# Patient Record
Sex: Male | Born: 1940
Health system: Southern US, Community
[De-identification: ages and names within clinical notes are randomized; demographics above are authoritative.]

## PROBLEM LIST (undated history)

## (undated) DIAGNOSIS — K219 Gastro-esophageal reflux disease without esophagitis: Secondary | ICD-10-CM

## (undated) DIAGNOSIS — E119 Type 2 diabetes mellitus without complications: Secondary | ICD-10-CM

## (undated) DIAGNOSIS — IMO0001 Reserved for inherently not codable concepts without codable children: Secondary | ICD-10-CM

## (undated) DIAGNOSIS — Z794 Long term (current) use of insulin: Secondary | ICD-10-CM

## (undated) DIAGNOSIS — M109 Gout, unspecified: Secondary | ICD-10-CM

## (undated) DIAGNOSIS — N189 Chronic kidney disease, unspecified: Secondary | ICD-10-CM

## (undated) DIAGNOSIS — I10 Essential (primary) hypertension: Secondary | ICD-10-CM

## (undated) DIAGNOSIS — N400 Enlarged prostate without lower urinary tract symptoms: Secondary | ICD-10-CM

## (undated) DIAGNOSIS — E785 Hyperlipidemia, unspecified: Secondary | ICD-10-CM

## (undated) HISTORY — DX: Chronic kidney disease, unspecified: N18.9

## (undated) HISTORY — DX: Benign prostatic hyperplasia without lower urinary tract symptoms: N40.0

## (undated) HISTORY — PX: BACK SURGERY: SHX140

## (undated) HISTORY — PX: KNEE ARTHROSCOPY: SUR90

## (undated) HISTORY — PX: BUNIONECTOMY: SHX129

## (undated) HISTORY — PX: NEPHRECTOMY: SHX65

---

## 2014-07-04 DIAGNOSIS — N183 Chronic kidney disease, stage 3 (moderate): Secondary | ICD-10-CM | POA: Diagnosis not present

## 2014-07-12 DIAGNOSIS — E782 Mixed hyperlipidemia: Secondary | ICD-10-CM | POA: Diagnosis not present

## 2014-07-12 DIAGNOSIS — I10 Essential (primary) hypertension: Secondary | ICD-10-CM | POA: Diagnosis not present

## 2014-07-12 DIAGNOSIS — N183 Chronic kidney disease, stage 3 (moderate): Secondary | ICD-10-CM | POA: Diagnosis not present

## 2014-07-12 DIAGNOSIS — Z905 Acquired absence of kidney: Secondary | ICD-10-CM | POA: Diagnosis not present

## 2014-07-12 DIAGNOSIS — E1122 Type 2 diabetes mellitus with diabetic chronic kidney disease: Secondary | ICD-10-CM | POA: Diagnosis not present

## 2014-10-04 DIAGNOSIS — E119 Type 2 diabetes mellitus without complications: Secondary | ICD-10-CM | POA: Diagnosis not present

## 2014-10-04 DIAGNOSIS — Z0101 Encounter for examination of eyes and vision with abnormal findings: Secondary | ICD-10-CM | POA: Diagnosis not present

## 2014-10-07 DIAGNOSIS — E559 Vitamin D deficiency, unspecified: Secondary | ICD-10-CM | POA: Diagnosis not present

## 2014-10-07 DIAGNOSIS — N183 Chronic kidney disease, stage 3 (moderate): Secondary | ICD-10-CM | POA: Diagnosis not present

## 2014-10-07 DIAGNOSIS — E1122 Type 2 diabetes mellitus with diabetic chronic kidney disease: Secondary | ICD-10-CM | POA: Diagnosis not present

## 2014-10-07 DIAGNOSIS — I10 Essential (primary) hypertension: Secondary | ICD-10-CM | POA: Diagnosis not present

## 2014-10-07 DIAGNOSIS — E782 Mixed hyperlipidemia: Secondary | ICD-10-CM | POA: Diagnosis not present

## 2014-10-11 DIAGNOSIS — E782 Mixed hyperlipidemia: Secondary | ICD-10-CM | POA: Diagnosis not present

## 2014-10-11 DIAGNOSIS — I10 Essential (primary) hypertension: Secondary | ICD-10-CM | POA: Diagnosis not present

## 2014-10-11 DIAGNOSIS — E1122 Type 2 diabetes mellitus with diabetic chronic kidney disease: Secondary | ICD-10-CM | POA: Diagnosis not present

## 2014-10-11 DIAGNOSIS — N183 Chronic kidney disease, stage 3 (moderate): Secondary | ICD-10-CM | POA: Diagnosis not present

## 2014-11-20 DIAGNOSIS — N183 Chronic kidney disease, stage 3 (moderate): Secondary | ICD-10-CM | POA: Diagnosis not present

## 2014-11-20 DIAGNOSIS — E1142 Type 2 diabetes mellitus with diabetic polyneuropathy: Secondary | ICD-10-CM | POA: Diagnosis not present

## 2014-11-20 DIAGNOSIS — E780 Pure hypercholesterolemia: Secondary | ICD-10-CM | POA: Diagnosis not present

## 2014-11-20 DIAGNOSIS — I10 Essential (primary) hypertension: Secondary | ICD-10-CM | POA: Diagnosis not present

## 2014-12-19 DIAGNOSIS — N183 Chronic kidney disease, stage 3 (moderate): Secondary | ICD-10-CM | POA: Diagnosis not present

## 2014-12-19 DIAGNOSIS — E1142 Type 2 diabetes mellitus with diabetic polyneuropathy: Secondary | ICD-10-CM | POA: Diagnosis not present

## 2014-12-19 DIAGNOSIS — J309 Allergic rhinitis, unspecified: Secondary | ICD-10-CM | POA: Diagnosis not present

## 2014-12-19 DIAGNOSIS — E780 Pure hypercholesterolemia: Secondary | ICD-10-CM | POA: Diagnosis not present

## 2014-12-19 DIAGNOSIS — H6120 Impacted cerumen, unspecified ear: Secondary | ICD-10-CM | POA: Diagnosis not present

## 2014-12-19 DIAGNOSIS — I10 Essential (primary) hypertension: Secondary | ICD-10-CM | POA: Diagnosis not present

## 2015-01-17 DIAGNOSIS — I10 Essential (primary) hypertension: Secondary | ICD-10-CM | POA: Diagnosis not present

## 2015-01-17 DIAGNOSIS — E559 Vitamin D deficiency, unspecified: Secondary | ICD-10-CM | POA: Diagnosis not present

## 2015-01-17 DIAGNOSIS — E1122 Type 2 diabetes mellitus with diabetic chronic kidney disease: Secondary | ICD-10-CM | POA: Diagnosis not present

## 2015-01-17 DIAGNOSIS — N183 Chronic kidney disease, stage 3 (moderate): Secondary | ICD-10-CM | POA: Diagnosis not present

## 2015-01-23 DIAGNOSIS — E1122 Type 2 diabetes mellitus with diabetic chronic kidney disease: Secondary | ICD-10-CM | POA: Diagnosis not present

## 2015-01-23 DIAGNOSIS — Z905 Acquired absence of kidney: Secondary | ICD-10-CM | POA: Diagnosis not present

## 2015-01-23 DIAGNOSIS — I10 Essential (primary) hypertension: Secondary | ICD-10-CM | POA: Diagnosis not present

## 2015-01-23 DIAGNOSIS — N183 Chronic kidney disease, stage 3 (moderate): Secondary | ICD-10-CM | POA: Diagnosis not present

## 2015-02-20 DIAGNOSIS — E780 Pure hypercholesterolemia: Secondary | ICD-10-CM | POA: Diagnosis not present

## 2015-02-20 DIAGNOSIS — N183 Chronic kidney disease, stage 3 (moderate): Secondary | ICD-10-CM | POA: Diagnosis not present

## 2015-02-20 DIAGNOSIS — I10 Essential (primary) hypertension: Secondary | ICD-10-CM | POA: Diagnosis not present

## 2015-02-20 DIAGNOSIS — J309 Allergic rhinitis, unspecified: Secondary | ICD-10-CM | POA: Diagnosis not present

## 2015-02-20 DIAGNOSIS — E119 Type 2 diabetes mellitus without complications: Secondary | ICD-10-CM | POA: Diagnosis not present

## 2015-02-20 DIAGNOSIS — E1142 Type 2 diabetes mellitus with diabetic polyneuropathy: Secondary | ICD-10-CM | POA: Diagnosis not present

## 2015-04-10 DIAGNOSIS — N521 Erectile dysfunction due to diseases classified elsewhere: Secondary | ICD-10-CM | POA: Diagnosis not present

## 2015-04-10 DIAGNOSIS — R972 Elevated prostate specific antigen [PSA]: Secondary | ICD-10-CM | POA: Diagnosis not present

## 2015-04-10 DIAGNOSIS — N401 Enlarged prostate with lower urinary tract symptoms: Secondary | ICD-10-CM | POA: Diagnosis not present

## 2015-04-10 DIAGNOSIS — E291 Testicular hypofunction: Secondary | ICD-10-CM | POA: Diagnosis not present

## 2015-04-18 DIAGNOSIS — N183 Chronic kidney disease, stage 3 (moderate): Secondary | ICD-10-CM | POA: Diagnosis not present

## 2015-04-24 DIAGNOSIS — R972 Elevated prostate specific antigen [PSA]: Secondary | ICD-10-CM | POA: Diagnosis not present

## 2015-04-24 DIAGNOSIS — N521 Erectile dysfunction due to diseases classified elsewhere: Secondary | ICD-10-CM | POA: Diagnosis not present

## 2015-04-24 DIAGNOSIS — N401 Enlarged prostate with lower urinary tract symptoms: Secondary | ICD-10-CM | POA: Diagnosis not present

## 2015-04-24 DIAGNOSIS — Z905 Acquired absence of kidney: Secondary | ICD-10-CM | POA: Diagnosis not present

## 2015-04-27 DIAGNOSIS — E782 Mixed hyperlipidemia: Secondary | ICD-10-CM | POA: Diagnosis not present

## 2015-04-27 DIAGNOSIS — I10 Essential (primary) hypertension: Secondary | ICD-10-CM | POA: Diagnosis not present

## 2015-04-27 DIAGNOSIS — N183 Chronic kidney disease, stage 3 (moderate): Secondary | ICD-10-CM | POA: Diagnosis not present

## 2015-04-27 DIAGNOSIS — E1122 Type 2 diabetes mellitus with diabetic chronic kidney disease: Secondary | ICD-10-CM | POA: Diagnosis not present

## 2015-05-23 DIAGNOSIS — E1142 Type 2 diabetes mellitus with diabetic polyneuropathy: Secondary | ICD-10-CM | POA: Diagnosis not present

## 2015-05-23 DIAGNOSIS — E1122 Type 2 diabetes mellitus with diabetic chronic kidney disease: Secondary | ICD-10-CM | POA: Diagnosis not present

## 2015-05-23 DIAGNOSIS — I10 Essential (primary) hypertension: Secondary | ICD-10-CM | POA: Diagnosis not present

## 2015-05-23 DIAGNOSIS — N400 Enlarged prostate without lower urinary tract symptoms: Secondary | ICD-10-CM | POA: Diagnosis not present

## 2015-05-23 DIAGNOSIS — E782 Mixed hyperlipidemia: Secondary | ICD-10-CM | POA: Diagnosis not present

## 2015-05-23 DIAGNOSIS — N183 Chronic kidney disease, stage 3 (moderate): Secondary | ICD-10-CM | POA: Diagnosis not present

## 2015-05-23 DIAGNOSIS — L84 Corns and callosities: Secondary | ICD-10-CM | POA: Diagnosis not present

## 2015-05-23 DIAGNOSIS — E1165 Type 2 diabetes mellitus with hyperglycemia: Secondary | ICD-10-CM | POA: Diagnosis not present

## 2015-07-23 DIAGNOSIS — E559 Vitamin D deficiency, unspecified: Secondary | ICD-10-CM | POA: Diagnosis not present

## 2015-07-25 DIAGNOSIS — E782 Mixed hyperlipidemia: Secondary | ICD-10-CM | POA: Diagnosis not present

## 2015-07-25 DIAGNOSIS — I10 Essential (primary) hypertension: Secondary | ICD-10-CM | POA: Diagnosis not present

## 2015-07-25 DIAGNOSIS — E1122 Type 2 diabetes mellitus with diabetic chronic kidney disease: Secondary | ICD-10-CM | POA: Diagnosis not present

## 2015-07-25 DIAGNOSIS — N183 Chronic kidney disease, stage 3 (moderate): Secondary | ICD-10-CM | POA: Diagnosis not present

## 2015-08-13 DIAGNOSIS — J208 Acute bronchitis due to other specified organisms: Secondary | ICD-10-CM | POA: Diagnosis not present

## 2015-08-13 DIAGNOSIS — I10 Essential (primary) hypertension: Secondary | ICD-10-CM | POA: Diagnosis not present

## 2015-08-13 DIAGNOSIS — L84 Corns and callosities: Secondary | ICD-10-CM | POA: Diagnosis not present

## 2015-08-13 DIAGNOSIS — N183 Chronic kidney disease, stage 3 (moderate): Secondary | ICD-10-CM | POA: Diagnosis not present

## 2015-08-13 DIAGNOSIS — R05 Cough: Secondary | ICD-10-CM | POA: Diagnosis not present

## 2015-08-13 DIAGNOSIS — E1165 Type 2 diabetes mellitus with hyperglycemia: Secondary | ICD-10-CM | POA: Diagnosis not present

## 2015-08-13 DIAGNOSIS — E782 Mixed hyperlipidemia: Secondary | ICD-10-CM | POA: Diagnosis not present

## 2015-08-13 DIAGNOSIS — E1122 Type 2 diabetes mellitus with diabetic chronic kidney disease: Secondary | ICD-10-CM | POA: Diagnosis not present

## 2015-08-13 DIAGNOSIS — N400 Enlarged prostate without lower urinary tract symptoms: Secondary | ICD-10-CM | POA: Diagnosis not present

## 2015-08-13 DIAGNOSIS — J060 Acute laryngopharyngitis: Secondary | ICD-10-CM | POA: Diagnosis not present

## 2015-08-13 DIAGNOSIS — E1142 Type 2 diabetes mellitus with diabetic polyneuropathy: Secondary | ICD-10-CM | POA: Diagnosis not present

## 2015-08-27 DIAGNOSIS — E1142 Type 2 diabetes mellitus with diabetic polyneuropathy: Secondary | ICD-10-CM | POA: Diagnosis not present

## 2015-08-27 DIAGNOSIS — E1165 Type 2 diabetes mellitus with hyperglycemia: Secondary | ICD-10-CM | POA: Diagnosis not present

## 2015-08-27 DIAGNOSIS — E782 Mixed hyperlipidemia: Secondary | ICD-10-CM | POA: Diagnosis not present

## 2015-08-27 DIAGNOSIS — N183 Chronic kidney disease, stage 3 (moderate): Secondary | ICD-10-CM | POA: Diagnosis not present

## 2015-08-27 DIAGNOSIS — E1122 Type 2 diabetes mellitus with diabetic chronic kidney disease: Secondary | ICD-10-CM | POA: Diagnosis not present

## 2015-08-27 DIAGNOSIS — Z905 Acquired absence of kidney: Secondary | ICD-10-CM | POA: Diagnosis not present

## 2015-08-27 DIAGNOSIS — R05 Cough: Secondary | ICD-10-CM | POA: Diagnosis not present

## 2015-08-27 DIAGNOSIS — N400 Enlarged prostate without lower urinary tract symptoms: Secondary | ICD-10-CM | POA: Diagnosis not present

## 2015-08-27 DIAGNOSIS — J208 Acute bronchitis due to other specified organisms: Secondary | ICD-10-CM | POA: Diagnosis not present

## 2015-08-27 DIAGNOSIS — J309 Allergic rhinitis, unspecified: Secondary | ICD-10-CM | POA: Diagnosis not present

## 2015-08-27 DIAGNOSIS — J060 Acute laryngopharyngitis: Secondary | ICD-10-CM | POA: Diagnosis not present

## 2015-08-27 DIAGNOSIS — I10 Essential (primary) hypertension: Secondary | ICD-10-CM | POA: Diagnosis not present

## 2015-08-27 DIAGNOSIS — L84 Corns and callosities: Secondary | ICD-10-CM | POA: Diagnosis not present

## 2015-10-05 DIAGNOSIS — E119 Type 2 diabetes mellitus without complications: Secondary | ICD-10-CM | POA: Diagnosis not present

## 2015-10-05 DIAGNOSIS — Z0101 Encounter for examination of eyes and vision with abnormal findings: Secondary | ICD-10-CM | POA: Diagnosis not present

## 2015-10-16 DIAGNOSIS — N183 Chronic kidney disease, stage 3 (moderate): Secondary | ICD-10-CM | POA: Diagnosis not present

## 2015-10-24 DIAGNOSIS — E1122 Type 2 diabetes mellitus with diabetic chronic kidney disease: Secondary | ICD-10-CM | POA: Diagnosis not present

## 2015-10-24 DIAGNOSIS — N183 Chronic kidney disease, stage 3 (moderate): Secondary | ICD-10-CM | POA: Diagnosis not present

## 2015-10-24 DIAGNOSIS — E782 Mixed hyperlipidemia: Secondary | ICD-10-CM | POA: Diagnosis not present

## 2015-10-24 DIAGNOSIS — I10 Essential (primary) hypertension: Secondary | ICD-10-CM | POA: Diagnosis not present

## 2016-01-21 DIAGNOSIS — E559 Vitamin D deficiency, unspecified: Secondary | ICD-10-CM | POA: Diagnosis not present

## 2016-01-21 DIAGNOSIS — E1122 Type 2 diabetes mellitus with diabetic chronic kidney disease: Secondary | ICD-10-CM | POA: Diagnosis not present

## 2016-01-21 DIAGNOSIS — N183 Chronic kidney disease, stage 3 (moderate): Secondary | ICD-10-CM | POA: Diagnosis not present

## 2016-01-21 DIAGNOSIS — I10 Essential (primary) hypertension: Secondary | ICD-10-CM | POA: Diagnosis not present

## 2016-01-30 DIAGNOSIS — Z905 Acquired absence of kidney: Secondary | ICD-10-CM | POA: Diagnosis not present

## 2016-01-30 DIAGNOSIS — E1122 Type 2 diabetes mellitus with diabetic chronic kidney disease: Secondary | ICD-10-CM | POA: Diagnosis not present

## 2016-01-30 DIAGNOSIS — I129 Hypertensive chronic kidney disease with stage 1 through stage 4 chronic kidney disease, or unspecified chronic kidney disease: Secondary | ICD-10-CM | POA: Diagnosis not present

## 2016-01-30 DIAGNOSIS — N183 Chronic kidney disease, stage 3 (moderate): Secondary | ICD-10-CM | POA: Diagnosis not present

## 2016-03-19 DIAGNOSIS — N183 Chronic kidney disease, stage 3 (moderate): Secondary | ICD-10-CM | POA: Diagnosis not present

## 2016-03-19 DIAGNOSIS — L84 Corns and callosities: Secondary | ICD-10-CM | POA: Diagnosis not present

## 2016-03-19 DIAGNOSIS — I10 Essential (primary) hypertension: Secondary | ICD-10-CM | POA: Diagnosis not present

## 2016-03-19 DIAGNOSIS — E1165 Type 2 diabetes mellitus with hyperglycemia: Secondary | ICD-10-CM | POA: Diagnosis not present

## 2016-03-19 DIAGNOSIS — J060 Acute laryngopharyngitis: Secondary | ICD-10-CM | POA: Diagnosis not present

## 2016-03-19 DIAGNOSIS — Z0000 Encounter for general adult medical examination without abnormal findings: Secondary | ICD-10-CM | POA: Diagnosis not present

## 2016-03-19 DIAGNOSIS — J208 Acute bronchitis due to other specified organisms: Secondary | ICD-10-CM | POA: Diagnosis not present

## 2016-03-19 DIAGNOSIS — N400 Enlarged prostate without lower urinary tract symptoms: Secondary | ICD-10-CM | POA: Diagnosis not present

## 2016-03-19 DIAGNOSIS — E1142 Type 2 diabetes mellitus with diabetic polyneuropathy: Secondary | ICD-10-CM | POA: Diagnosis not present

## 2016-03-19 DIAGNOSIS — R05 Cough: Secondary | ICD-10-CM | POA: Diagnosis not present

## 2016-03-19 DIAGNOSIS — E782 Mixed hyperlipidemia: Secondary | ICD-10-CM | POA: Diagnosis not present

## 2016-03-19 DIAGNOSIS — E1122 Type 2 diabetes mellitus with diabetic chronic kidney disease: Secondary | ICD-10-CM | POA: Diagnosis not present

## 2016-04-28 DIAGNOSIS — Z905 Acquired absence of kidney: Secondary | ICD-10-CM | POA: Diagnosis not present

## 2016-04-28 DIAGNOSIS — E559 Vitamin D deficiency, unspecified: Secondary | ICD-10-CM | POA: Diagnosis not present

## 2016-04-28 DIAGNOSIS — N183 Chronic kidney disease, stage 3 (moderate): Secondary | ICD-10-CM | POA: Diagnosis not present

## 2016-04-28 DIAGNOSIS — E1122 Type 2 diabetes mellitus with diabetic chronic kidney disease: Secondary | ICD-10-CM | POA: Diagnosis not present

## 2016-04-28 DIAGNOSIS — M1039 Gout due to renal impairment, multiple sites: Secondary | ICD-10-CM | POA: Diagnosis not present

## 2016-05-06 DIAGNOSIS — N183 Chronic kidney disease, stage 3 (moderate): Secondary | ICD-10-CM | POA: Diagnosis not present

## 2016-05-06 DIAGNOSIS — I129 Hypertensive chronic kidney disease with stage 1 through stage 4 chronic kidney disease, or unspecified chronic kidney disease: Secondary | ICD-10-CM | POA: Diagnosis not present

## 2016-05-06 DIAGNOSIS — Z905 Acquired absence of kidney: Secondary | ICD-10-CM | POA: Diagnosis not present

## 2016-05-06 DIAGNOSIS — E1122 Type 2 diabetes mellitus with diabetic chronic kidney disease: Secondary | ICD-10-CM | POA: Diagnosis not present

## 2016-05-08 DIAGNOSIS — N521 Erectile dysfunction due to diseases classified elsewhere: Secondary | ICD-10-CM | POA: Diagnosis not present

## 2016-05-08 DIAGNOSIS — N401 Enlarged prostate with lower urinary tract symptoms: Secondary | ICD-10-CM | POA: Diagnosis not present

## 2016-05-08 DIAGNOSIS — Z905 Acquired absence of kidney: Secondary | ICD-10-CM | POA: Diagnosis not present

## 2016-05-08 DIAGNOSIS — R972 Elevated prostate specific antigen [PSA]: Secondary | ICD-10-CM | POA: Diagnosis not present

## 2016-05-26 ENCOUNTER — Emergency Department (HOSPITAL_COMMUNITY): Payer: Medicare Other

## 2016-05-26 ENCOUNTER — Inpatient Hospital Stay (HOSPITAL_COMMUNITY)
Admission: EM | Admit: 2016-05-26 | Discharge: 2016-06-06 | DRG: 871 | Disposition: A | Discharge status: Home Health | Payer: Medicare Other | Attending: Internal Medicine | Admitting: Internal Medicine

## 2016-05-26 ENCOUNTER — Encounter (HOSPITAL_COMMUNITY): Payer: Self-pay

## 2016-05-26 DIAGNOSIS — R410 Disorientation, unspecified: Secondary | ICD-10-CM | POA: Diagnosis present

## 2016-05-26 DIAGNOSIS — E669 Obesity, unspecified: Secondary | ICD-10-CM | POA: Diagnosis present

## 2016-05-26 DIAGNOSIS — D649 Anemia, unspecified: Secondary | ICD-10-CM | POA: Diagnosis present

## 2016-05-26 DIAGNOSIS — J3489 Other specified disorders of nose and nasal sinuses: Secondary | ICD-10-CM | POA: Diagnosis not present

## 2016-05-26 DIAGNOSIS — K830 Cholangitis: Secondary | ICD-10-CM | POA: Diagnosis present

## 2016-05-26 DIAGNOSIS — R7401 Elevation of levels of liver transaminase levels: Secondary | ICD-10-CM | POA: Diagnosis present

## 2016-05-26 DIAGNOSIS — M6282 Rhabdomyolysis: Secondary | ICD-10-CM | POA: Diagnosis present

## 2016-05-26 DIAGNOSIS — A419 Sepsis, unspecified organism: Secondary | ICD-10-CM | POA: Diagnosis not present

## 2016-05-26 DIAGNOSIS — R197 Diarrhea, unspecified: Secondary | ICD-10-CM | POA: Diagnosis present

## 2016-05-26 DIAGNOSIS — R0600 Dyspnea, unspecified: Secondary | ICD-10-CM | POA: Diagnosis not present

## 2016-05-26 DIAGNOSIS — N179 Acute kidney failure, unspecified: Secondary | ICD-10-CM | POA: Diagnosis present

## 2016-05-26 DIAGNOSIS — J9601 Acute respiratory failure with hypoxia: Secondary | ICD-10-CM | POA: Diagnosis not present

## 2016-05-26 DIAGNOSIS — E785 Hyperlipidemia, unspecified: Secondary | ICD-10-CM | POA: Diagnosis present

## 2016-05-26 DIAGNOSIS — K529 Noninfective gastroenteritis and colitis, unspecified: Secondary | ICD-10-CM | POA: Diagnosis not present

## 2016-05-26 DIAGNOSIS — G9341 Metabolic encephalopathy: Secondary | ICD-10-CM | POA: Diagnosis not present

## 2016-05-26 DIAGNOSIS — N390 Urinary tract infection, site not specified: Secondary | ICD-10-CM

## 2016-05-26 DIAGNOSIS — A4151 Sepsis due to Escherichia coli [E. coli]: Principal | ICD-10-CM | POA: Diagnosis present

## 2016-05-26 DIAGNOSIS — D696 Thrombocytopenia, unspecified: Secondary | ICD-10-CM | POA: Diagnosis present

## 2016-05-26 DIAGNOSIS — I5021 Acute systolic (congestive) heart failure: Secondary | ICD-10-CM | POA: Diagnosis present

## 2016-05-26 DIAGNOSIS — K219 Gastro-esophageal reflux disease without esophagitis: Secondary | ICD-10-CM | POA: Diagnosis present

## 2016-05-26 DIAGNOSIS — E872 Acidosis: Secondary | ICD-10-CM | POA: Diagnosis present

## 2016-05-26 DIAGNOSIS — I272 Pulmonary hypertension, unspecified: Secondary | ICD-10-CM | POA: Diagnosis present

## 2016-05-26 DIAGNOSIS — I10 Essential (primary) hypertension: Secondary | ICD-10-CM | POA: Diagnosis present

## 2016-05-26 DIAGNOSIS — R531 Weakness: Secondary | ICD-10-CM | POA: Diagnosis not present

## 2016-05-26 DIAGNOSIS — R404 Transient alteration of awareness: Secondary | ICD-10-CM | POA: Diagnosis not present

## 2016-05-26 DIAGNOSIS — K810 Acute cholecystitis: Secondary | ICD-10-CM | POA: Diagnosis present

## 2016-05-26 DIAGNOSIS — E875 Hyperkalemia: Secondary | ICD-10-CM | POA: Diagnosis not present

## 2016-05-26 DIAGNOSIS — I248 Other forms of acute ischemic heart disease: Secondary | ICD-10-CM | POA: Diagnosis present

## 2016-05-26 DIAGNOSIS — R6520 Severe sepsis without septic shock: Secondary | ICD-10-CM | POA: Diagnosis present

## 2016-05-26 DIAGNOSIS — E1165 Type 2 diabetes mellitus with hyperglycemia: Secondary | ICD-10-CM | POA: Diagnosis present

## 2016-05-26 DIAGNOSIS — K7200 Acute and subacute hepatic failure without coma: Secondary | ICD-10-CM | POA: Diagnosis not present

## 2016-05-26 DIAGNOSIS — Z6834 Body mass index (BMI) 34.0-34.9, adult: Secondary | ICD-10-CM

## 2016-05-26 DIAGNOSIS — N281 Cyst of kidney, acquired: Secondary | ICD-10-CM | POA: Diagnosis present

## 2016-05-26 DIAGNOSIS — Z452 Encounter for adjustment and management of vascular access device: Secondary | ICD-10-CM

## 2016-05-26 DIAGNOSIS — IMO0001 Reserved for inherently not codable concepts without codable children: Secondary | ICD-10-CM

## 2016-05-26 DIAGNOSIS — W182XXA Fall in (into) shower or empty bathtub, initial encounter: Secondary | ICD-10-CM | POA: Diagnosis present

## 2016-05-26 DIAGNOSIS — B9620 Unspecified Escherichia coli [E. coli] as the cause of diseases classified elsewhere: Secondary | ICD-10-CM

## 2016-05-26 DIAGNOSIS — I429 Cardiomyopathy, unspecified: Secondary | ICD-10-CM

## 2016-05-26 DIAGNOSIS — Z794 Long term (current) use of insulin: Secondary | ICD-10-CM

## 2016-05-26 DIAGNOSIS — R7881 Bacteremia: Secondary | ICD-10-CM

## 2016-05-26 DIAGNOSIS — Z905 Acquired absence of kidney: Secondary | ICD-10-CM

## 2016-05-26 DIAGNOSIS — Z8249 Family history of ischemic heart disease and other diseases of the circulatory system: Secondary | ICD-10-CM

## 2016-05-26 DIAGNOSIS — R32 Unspecified urinary incontinence: Secondary | ICD-10-CM | POA: Diagnosis not present

## 2016-05-26 DIAGNOSIS — N170 Acute kidney failure with tubular necrosis: Secondary | ICD-10-CM | POA: Diagnosis not present

## 2016-05-26 DIAGNOSIS — K8309 Other cholangitis: Secondary | ICD-10-CM

## 2016-05-26 DIAGNOSIS — M109 Gout, unspecified: Secondary | ICD-10-CM | POA: Diagnosis present

## 2016-05-26 DIAGNOSIS — R740 Nonspecific elevation of levels of transaminase and lactic acid dehydrogenase [LDH]: Secondary | ICD-10-CM

## 2016-05-26 DIAGNOSIS — Y93E1 Activity, personal bathing and showering: Secondary | ICD-10-CM

## 2016-05-26 DIAGNOSIS — R7989 Other specified abnormal findings of blood chemistry: Secondary | ICD-10-CM | POA: Diagnosis not present

## 2016-05-26 DIAGNOSIS — E119 Type 2 diabetes mellitus without complications: Secondary | ICD-10-CM

## 2016-05-26 DIAGNOSIS — E1122 Type 2 diabetes mellitus with diabetic chronic kidney disease: Secondary | ICD-10-CM | POA: Diagnosis present

## 2016-05-26 HISTORY — DX: Gastro-esophageal reflux disease without esophagitis: K21.9

## 2016-05-26 HISTORY — DX: Reserved for inherently not codable concepts without codable children: IMO0001

## 2016-05-26 HISTORY — DX: Essential (primary) hypertension: I10

## 2016-05-26 HISTORY — DX: Gout, unspecified: M10.9

## 2016-05-26 HISTORY — DX: Hyperlipidemia, unspecified: E78.5

## 2016-05-26 HISTORY — DX: Type 2 diabetes mellitus without complications: E11.9

## 2016-05-26 HISTORY — DX: Long term (current) use of insulin: Z79.4

## 2016-05-26 LAB — CBC WITH DIFFERENTIAL/PLATELET
Basophils Absolute: 0 10*3/uL (ref 0.0–0.1)
Basophils Relative: 0 %
EOS ABS: 0 10*3/uL (ref 0.0–0.7)
EOS PCT: 0 %
HCT: 32.6 % — ABNORMAL LOW (ref 39.0–52.0)
Hemoglobin: 11.2 g/dL — ABNORMAL LOW (ref 13.0–17.0)
LYMPHS ABS: 0.6 10*3/uL — AB (ref 0.7–4.0)
Lymphocytes Relative: 6 %
MCH: 30.6 pg (ref 26.0–34.0)
MCHC: 34.4 g/dL (ref 30.0–36.0)
MCV: 89.1 fL (ref 78.0–100.0)
Monocytes Absolute: 0.9 10*3/uL (ref 0.1–1.0)
Monocytes Relative: 9 %
Neutro Abs: 8.8 10*3/uL — ABNORMAL HIGH (ref 1.7–7.7)
Neutrophils Relative %: 85 %
PLATELETS: 95 10*3/uL — AB (ref 150–400)
RBC: 3.66 MIL/uL — AB (ref 4.22–5.81)
RDW: 14.8 % (ref 11.5–15.5)
WBC: 10.3 10*3/uL (ref 4.0–10.5)

## 2016-05-26 LAB — TROPONIN I: TROPONIN I: 0.13 ng/mL — AB (ref <0.03)

## 2016-05-26 LAB — COMPREHENSIVE METABOLIC PANEL
ALK PHOS: 110 U/L (ref 38–126)
ALT: 101 U/L — AB (ref 17–63)
AST: 310 U/L — ABNORMAL HIGH (ref 15–41)
Albumin: 2.8 g/dL — ABNORMAL LOW (ref 3.5–5.0)
Anion gap: 12 (ref 5–15)
BILIRUBIN TOTAL: 1.2 mg/dL (ref 0.3–1.2)
BUN: 50 mg/dL — ABNORMAL HIGH (ref 6–20)
CALCIUM: 8.6 mg/dL — AB (ref 8.9–10.3)
CO2: 16 mmol/L — AB (ref 22–32)
CREATININE: 4.78 mg/dL — AB (ref 0.61–1.24)
Chloride: 98 mmol/L — ABNORMAL LOW (ref 101–111)
GFR, EST AFRICAN AMERICAN: 13 mL/min — AB (ref >60)
GFR, EST NON AFRICAN AMERICAN: 11 mL/min — AB (ref >60)
Glucose, Bld: 347 mg/dL — ABNORMAL HIGH (ref 65–99)
Potassium: 4.9 mmol/L (ref 3.5–5.1)
Sodium: 126 mmol/L — ABNORMAL LOW (ref 135–145)
Total Protein: 6.1 g/dL — ABNORMAL LOW (ref 6.5–8.1)

## 2016-05-26 LAB — BRAIN NATRIURETIC PEPTIDE: B Natriuretic Peptide: 190.1 pg/mL — ABNORMAL HIGH (ref 0.0–100.0)

## 2016-05-26 LAB — I-STAT CG4 LACTIC ACID, ED: LACTIC ACID, VENOUS: 3.34 mmol/L — AB (ref 0.5–1.9)

## 2016-05-26 MED ORDER — ACETAMINOPHEN 500 MG PO TABS
1000.0000 mg | ORAL_TABLET | Freq: Once | ORAL | Status: AC
  Administered 2016-05-27: 1000 mg via ORAL
  Filled 2016-05-26: qty 2

## 2016-05-26 MED ORDER — PIPERACILLIN-TAZOBACTAM 3.375 G IVPB 30 MIN
3.3750 g | Freq: Once | INTRAVENOUS | Status: AC
  Administered 2016-05-27: 3.375 g via INTRAVENOUS

## 2016-05-26 MED ORDER — VANCOMYCIN HCL 10 G IV SOLR
1500.0000 mg | Freq: Once | INTRAVENOUS | Status: AC
  Administered 2016-05-27: 1500 mg via INTRAVENOUS
  Filled 2016-05-26: qty 1500

## 2016-05-26 MED ORDER — SODIUM CHLORIDE 0.9 % IV BOLUS (SEPSIS)
30.0000 mL/kg | Freq: Once | INTRAVENOUS | Status: AC
  Administered 2016-05-26: 2859 mL via INTRAVENOUS

## 2016-05-26 NOTE — ED Notes (Signed)
Elevated CG-4 of 3.34 reported to [facility]

## 2016-05-26 NOTE — ED Provider Notes (Signed)
[facility]-EMERGENCY DEPT Provider Note   CSN: [id number] Arrival date & time: 05/26/16  2150     History   Chief Complaint Chief Complaint  Patient presents with  . Shortness of Breath  . Weakness      HPI HPI Jonathan Waller is a 75 y.o. Male with history of IDDM, HTN, HLD, gout, GERD, single kidney who presents to the emergency room for evaluation of generalized weakness, fever, fall, and intermittent confusion. He is accompanied by his daughter who helps provide some history. Pt lives alone. He was reportedly in his usual state of health until two days ago when he started feeling generally weak like he had no strength. He states he has also felt more dyspenic on exertion. Denies chest pain. Endorses productive cough. He states today he fell twice. The first episode was this morning. He thinks he slid off the toilet. He is not sure how long he was on the floor until he was found by his family members. He states he did not lose consciousness or hit his head. The second fall was earlier this evening. His daughter found him on the bedroom floor. Pt states he tried to get out of bed but did not have the strength to get up. He thinks he was on the ground approximately one hour until his daughter got to him. When she found him pt had soild himself with diarrhea. he has had multiple episodes of loose stools today. Denies recent abx. Denies urinary symptoms. Pt's daughter states tmax at home 101F orally. He has had no medications prior to arrival. Pt's daughter states that pt has also been intermittently confused and "off" these past two days. In the ED pt is alert and oriented, complaining of low back pain. However, he does have some moments where his mentation seems less lucid.   History reviewed. No pertinent past medical history.  There are no active problems to display for this patient.   History reviewed. No pertinent surgical history.   Home Medications    Prior to Admission medications    Medication Sig Start Date End Date Taking? Authorizing Provider  allopurinol (ZYLOPRIM) 300 MG tablet Take 300 mg by mouth daily.   Yes Historical Provider, MD  amLODipine (NORVASC) 5 MG tablet Take 5 mg by mouth daily.   Yes Historical Provider, MD  calcium carbonate (TUMS - DOSED IN MG ELEMENTAL CALCIUM) 500 MG chewable tablet Chew 1-2 tablets by mouth 3 (three) times daily as needed for indigestion or heartburn.   Yes Historical Provider, MD  cetirizine (ZYRTEC) 10 MG tablet Take 10 mg by mouth daily.   Yes Historical Provider, MD  insulin aspart protamine- aspart (NOVOLOG MIX 70/30) (70-30) 100 UNIT/ML injection Inject 0-20 Units into the skin 3 (three) times daily.   Yes Historical Provider, MD  OVER THE COUNTER MEDICATION Take 2 capsules by mouth 2 (two) times daily. Ventricore   Yes Historical Provider, MD  ranitidine (ZANTAC) 150 MG tablet Take 150 mg by mouth 2 (two) times daily.   Yes Historical Provider, MD  sildenafil (REVATIO) 20 MG tablet Take 60-100 mg by mouth daily as needed (ED).   Yes Historical Provider, MD  simvastatin (ZOCOR) 20 MG tablet Take 20 mg by mouth daily.   Yes Historical Provider, MD  spironolactone (ALDACTONE) 25 MG tablet Take 12.5 mg by mouth at bedtime.   Yes Historical Provider, MD  tamsulosin (FLOMAX) 0.4 MG CAPS capsule Take 0.4 mg by mouth daily.   Yes Historical Provider, MD  valsartan (DIOVAN) 320 MG tablet Take 320 mg by mouth daily.   Yes Historical Provider, MD  vardenafil (LEVITRA) 20 MG tablet Take 20 mg by mouth daily as needed for erectile dysfunction.   Yes Historical Provider, MD    Family History History reviewed. No pertinent family history.  Social History Social History  Substance Use Topics  . Smoking status: Never Smoker  . Smokeless tobacco: Never Used  . Alcohol use No     Allergies   Aspirin   Review of Systems Review of Systems 10 Systems reviewed and are negative for acute change except as noted in the  HPI.   Physical Exam Updated Vital Signs BP 148/65   Pulse 115   Temp 99.1 F (37.3 C) (Oral)   Resp 23   SpO2 96%   Physical Exam  Constitutional: He is oriented to person, place, and time.  HENT:  Right Ear: External ear normal.  Left Ear: External ear normal.  Nose: Nose normal.  Mouth/Throat: Oropharynx is clear and moist. No oropharyngeal exudate.  Eyes: Conjunctivae are normal.  Neck: Neck supple.  Cardiovascular: Regular rhythm, normal heart sounds and intact distal pulses.   Tachycardic to 110s  Pulmonary/Chest: Effort normal. No respiratory distress. He has no wheezes.  Diminished breath sounds throughout  Abdominal: Soft. Bowel sounds are normal. He exhibits no distension. There is no tenderness. There is no rebound and no guarding.  No CVA tenderness  Musculoskeletal: He exhibits no edema.  No LE edema No midline back tenderness Moves all extremities freely  Lymphadenopathy:    He has no cervical adenopathy.  Neurological: He is alert and oriented to person, place, and time. No cranial nerve deficit.  Skin: Skin is warm and dry.  Warm to touch  Psychiatric: He has a normal mood and affect.  Nursing note and vitals reviewed.    ED Treatments / Results  Labs (all labs ordered are listed, but only abnormal results are displayed) Labs Reviewed  COMPREHENSIVE METABOLIC PANEL - Abnormal; Notable for the following:       Result Value   Sodium 126 (*)    Chloride 98 (*)    CO2 16 (*)    Glucose, Bld 347 (*)    BUN 50 (*)    Creatinine, Ser 4.78 (*)    Calcium 8.6 (*)    Total Protein 6.1 (*)    Albumin 2.8 (*)    AST 310 (*)    ALT 101 (*)    GFR calc non Af Amer 11 (*)    GFR calc Af Amer 13 (*)    All other components within normal limits  TROPONIN I - Abnormal; Notable for the following:    Troponin I 0.13 (*)    All other components within normal limits  CBC WITH DIFFERENTIAL/PLATELET - Abnormal; Notable for the following:    RBC 3.66 (*)     Hemoglobin 11.2 (*)    HCT 32.6 (*)    Platelets 95 (*)    Neutro Abs 8.8 (*)    Lymphs Abs 0.6 (*)    All other components within normal limits  CK - Abnormal; Notable for the following:    Total CK 25,775 (*)    All other components within normal limits  BRAIN NATRIURETIC PEPTIDE - Abnormal; Notable for the following:    B Natriuretic Peptide 190.1 (*)    All other components within normal limits  I-STAT CG4 LACTIC ACID, ED - Abnormal; Notable for the following:  Lactic Acid, Venous 3.34 (*)    All other components within normal limits  URINE CULTURE  CULTURE, BLOOD (ROUTINE X 2)  CULTURE, BLOOD (ROUTINE X 2)  URINALYSIS, ROUTINE W REFLEX MICROSCOPIC (NOT [facility])  INFLUENZA PANEL BY PCR (TYPE A & B, H1N1)  TROPONIN I  TROPONIN I  TROPONIN I    EKG  EKG Interpretation  Date/Time:  Monday May 26 2016 21:54:17 EST Ventricular Rate:  115 PR Interval:    QRS Duration: 149 QT Interval:  344 QTC Calculation: 476 R Axis:   -63 Text Interpretation:  Sinus tachycardia RBBB and LAFB ST-t wave abnormality Abnormal ekg Confirmed by Gerhard MunchLOCKWOOD, ROBERT  MD [phone]([id number]) on 05/26/2016 10:01:47 PM       Radiology Dg Chest 2 View  Result Date: 05/26/2016 CLINICAL DATA:  Generalized weakness for several weeks.  Dyspnea. EXAM: CHEST  2 VIEW COMPARISON:  None. FINDINGS: AP semi upright and lateral views are provided. Lung volumes are slightly low with mild interstitial edema. No pneumonic consolidation, significant pleural fluid collections or pneumothoraces. The cardiac silhouette is marginally enlarged but this could be due to the AP projection. The aorta is not aneurysmal. No acute osseous abnormality. IMPRESSION: Mild interstitial prominence which may reflect interstitial edema. Mild cardiomegaly but this could be due to the AP projection. Electronically Signed   By: Tollie Ethavid  Kwon M.D.   On: 05/26/2016 23:00    Procedures Procedures (including critical care time)  Medications Ordered  in ED Medications  sodium chloride 0.9 % bolus 30 mL/kg (not administered)  acetaminophen (TYLENOL) tablet 1,000 mg (not administered)  piperacillin-tazobactam (ZOSYN) IVPB 3.375 g (not administered)     Initial Impression / Assessment and Plan / ED Course  I have reviewed the triage vital signs and the nursing notes.  Pertinent labs & imaging results that were available during my care of the patient were reviewed by me and considered in my medical decision making (see chart for details).  Clinical Course   pt is an 75 y.o. male presenting with two days of generalized weakness, some intermittent confusion at home. Fell twice today. tmax at home 1022F, no meds given. Temp here 99.22F orally on arrival. He is tachycardic, normotensive. Exam nonfocal. Will check CBC, CMP, urinalysis, troponin, and lactic acid. Fluids ordered.  11:15 PM Rectal temp 102.7. Intermittently tachypneic. Fluids have been ordered 30 cc/kg. I will add vanc/zosyn, tylenol. Code sepsis called. Pan cultures ordered.  12:07 AM Pt with renal failure and sepsis.No leukocytosis but lactic acid 3.34, Creatinine 4.78 BUN 50. Trop 0.13 likely demand. EKG in sinus tach with right bundle. LFTs elevated. Pt seen by Dr. Jeraldine LootsLockwood as well. We will call hospitalist service for stepdown admission. qSOFA 2.   CK 25,775 pt also in rhabdo. Dr. Julian ReilGardner to see and admit.  Final Clinical Impressions(s) / ED Diagnoses   Final diagnoses:  AKI (acute kidney injury) (HCC)  Sepsis, due to unspecified organism [facility](HCC)    New Prescriptions New Prescriptions   No medications on file     Derl BarrowSerena Y Ameet Sandy, PA-C 05/27/16 0041    Gerhard Munchobert Lockwood, MD 05/28/16 0004

## 2016-05-26 NOTE — ED Triage Notes (Signed)
Patient come by EMS for weakness and associated shortness of breath.  Patient has no dyspnea at rest only with exertion.  EMS was concerned for a STEMI but EKG and transmitted EKG did not show a STEMI.  MD aware.  Patient A&Ox4

## 2016-05-27 ENCOUNTER — Inpatient Hospital Stay (HOSPITAL_COMMUNITY): Payer: Medicare Other

## 2016-05-27 ENCOUNTER — Encounter (HOSPITAL_COMMUNITY): Payer: Self-pay | Admitting: Internal Medicine

## 2016-05-27 DIAGNOSIS — N390 Urinary tract infection, site not specified: Secondary | ICD-10-CM | POA: Diagnosis not present

## 2016-05-27 DIAGNOSIS — E119 Type 2 diabetes mellitus without complications: Secondary | ICD-10-CM | POA: Diagnosis not present

## 2016-05-27 DIAGNOSIS — I272 Pulmonary hypertension, unspecified: Secondary | ICD-10-CM

## 2016-05-27 DIAGNOSIS — I5021 Acute systolic (congestive) heart failure: Secondary | ICD-10-CM | POA: Diagnosis not present

## 2016-05-27 DIAGNOSIS — R1011 Right upper quadrant pain: Secondary | ICD-10-CM | POA: Diagnosis not present

## 2016-05-27 DIAGNOSIS — E875 Hyperkalemia: Secondary | ICD-10-CM | POA: Diagnosis not present

## 2016-05-27 DIAGNOSIS — A419 Sepsis, unspecified organism: Secondary | ICD-10-CM | POA: Diagnosis not present

## 2016-05-27 DIAGNOSIS — K219 Gastro-esophageal reflux disease without esophagitis: Secondary | ICD-10-CM | POA: Diagnosis present

## 2016-05-27 DIAGNOSIS — I248 Other forms of acute ischemic heart disease: Secondary | ICD-10-CM

## 2016-05-27 DIAGNOSIS — A4151 Sepsis due to Escherichia coli [E. coli]: Principal | ICD-10-CM

## 2016-05-27 DIAGNOSIS — R9431 Abnormal electrocardiogram [ECG] [EKG]: Secondary | ICD-10-CM | POA: Diagnosis not present

## 2016-05-27 DIAGNOSIS — Z905 Acquired absence of kidney: Secondary | ICD-10-CM | POA: Diagnosis not present

## 2016-05-27 DIAGNOSIS — Z794 Long term (current) use of insulin: Secondary | ICD-10-CM

## 2016-05-27 DIAGNOSIS — K830 Cholangitis: Secondary | ICD-10-CM

## 2016-05-27 DIAGNOSIS — E1129 Type 2 diabetes mellitus with other diabetic kidney complication: Secondary | ICD-10-CM | POA: Diagnosis not present

## 2016-05-27 DIAGNOSIS — E1165 Type 2 diabetes mellitus with hyperglycemia: Secondary | ICD-10-CM | POA: Diagnosis present

## 2016-05-27 DIAGNOSIS — I10 Essential (primary) hypertension: Secondary | ICD-10-CM | POA: Diagnosis present

## 2016-05-27 DIAGNOSIS — G9341 Metabolic encephalopathy: Secondary | ICD-10-CM | POA: Diagnosis not present

## 2016-05-27 DIAGNOSIS — K819 Cholecystitis, unspecified: Secondary | ICD-10-CM

## 2016-05-27 DIAGNOSIS — R6520 Severe sepsis without septic shock: Secondary | ICD-10-CM

## 2016-05-27 DIAGNOSIS — N179 Acute kidney failure, unspecified: Secondary | ICD-10-CM | POA: Insufficient documentation

## 2016-05-27 DIAGNOSIS — M6282 Rhabdomyolysis: Secondary | ICD-10-CM | POA: Diagnosis not present

## 2016-05-27 DIAGNOSIS — K810 Acute cholecystitis: Secondary | ICD-10-CM | POA: Diagnosis present

## 2016-05-27 DIAGNOSIS — Z8249 Family history of ischemic heart disease and other diseases of the circulatory system: Secondary | ICD-10-CM | POA: Diagnosis not present

## 2016-05-27 DIAGNOSIS — Y93E1 Activity, personal bathing and showering: Secondary | ICD-10-CM | POA: Diagnosis not present

## 2016-05-27 DIAGNOSIS — M109 Gout, unspecified: Secondary | ICD-10-CM | POA: Diagnosis present

## 2016-05-27 DIAGNOSIS — E872 Acidosis: Secondary | ICD-10-CM | POA: Diagnosis present

## 2016-05-27 DIAGNOSIS — D696 Thrombocytopenia, unspecified: Secondary | ICD-10-CM | POA: Diagnosis present

## 2016-05-27 DIAGNOSIS — W182XXA Fall in (into) shower or empty bathtub, initial encounter: Secondary | ICD-10-CM | POA: Diagnosis present

## 2016-05-27 DIAGNOSIS — E118 Type 2 diabetes mellitus with unspecified complications: Secondary | ICD-10-CM

## 2016-05-27 DIAGNOSIS — R740 Nonspecific elevation of levels of transaminase and lactic acid dehydrogenase [LDH]: Secondary | ICD-10-CM

## 2016-05-27 DIAGNOSIS — R410 Disorientation, unspecified: Secondary | ICD-10-CM | POA: Diagnosis not present

## 2016-05-27 DIAGNOSIS — R7881 Bacteremia: Secondary | ICD-10-CM | POA: Diagnosis not present

## 2016-05-27 DIAGNOSIS — A09 Infectious gastroenteritis and colitis, unspecified: Secondary | ICD-10-CM | POA: Diagnosis not present

## 2016-05-27 DIAGNOSIS — K7200 Acute and subacute hepatic failure without coma: Secondary | ICD-10-CM | POA: Diagnosis not present

## 2016-05-27 DIAGNOSIS — E785 Hyperlipidemia, unspecified: Secondary | ICD-10-CM | POA: Diagnosis present

## 2016-05-27 DIAGNOSIS — Z452 Encounter for adjustment and management of vascular access device: Secondary | ICD-10-CM | POA: Diagnosis not present

## 2016-05-27 DIAGNOSIS — I369 Nonrheumatic tricuspid valve disorder, unspecified: Secondary | ICD-10-CM | POA: Diagnosis not present

## 2016-05-27 DIAGNOSIS — E669 Obesity, unspecified: Secondary | ICD-10-CM | POA: Diagnosis present

## 2016-05-27 DIAGNOSIS — N170 Acute kidney failure with tubular necrosis: Secondary | ICD-10-CM | POA: Diagnosis present

## 2016-05-27 DIAGNOSIS — R7401 Elevation of levels of liver transaminase levels: Secondary | ICD-10-CM | POA: Diagnosis present

## 2016-05-27 DIAGNOSIS — IMO0001 Reserved for inherently not codable concepts without codable children: Secondary | ICD-10-CM

## 2016-05-27 DIAGNOSIS — E1122 Type 2 diabetes mellitus with diabetic chronic kidney disease: Secondary | ICD-10-CM | POA: Diagnosis present

## 2016-05-27 DIAGNOSIS — I429 Cardiomyopathy, unspecified: Secondary | ICD-10-CM | POA: Diagnosis not present

## 2016-05-27 DIAGNOSIS — R0602 Shortness of breath: Secondary | ICD-10-CM | POA: Diagnosis not present

## 2016-05-27 DIAGNOSIS — J9601 Acute respiratory failure with hypoxia: Secondary | ICD-10-CM | POA: Diagnosis present

## 2016-05-27 DIAGNOSIS — K8309 Other cholangitis: Secondary | ICD-10-CM

## 2016-05-27 LAB — BASIC METABOLIC PANEL
ANION GAP: 14 (ref 5–15)
ANION GAP: 14 (ref 5–15)
ANION GAP: 13 (ref 5–15)
BUN: 58 mg/dL — ABNORMAL HIGH (ref 6–20)
BUN: 63 mg/dL — ABNORMAL HIGH (ref 6–20)
BUN: 63 mg/dL — ABNORMAL HIGH (ref 6–20)
CALCIUM: 8.2 mg/dL — AB (ref 8.9–10.3)
CALCIUM: 8.4 mg/dL — AB (ref 8.9–10.3)
CHLORIDE: 101 mmol/L (ref 101–111)
CHLORIDE: 100 mmol/L — AB (ref 101–111)
CHLORIDE: 101 mmol/L (ref 101–111)
CO2: 16 mmol/L — AB (ref 22–32)
CO2: 15 mmol/L — AB (ref 22–32)
CO2: 17 mmol/L — AB (ref 22–32)
CREATININE: 6.97 mg/dL — AB (ref 0.61–1.24)
Calcium: 8.6 mg/dL — ABNORMAL LOW (ref 8.9–10.3)
Creatinine, Ser: 6.6 mg/dL — ABNORMAL HIGH (ref 0.61–1.24)
Creatinine, Ser: 6.84 mg/dL — ABNORMAL HIGH (ref 0.61–1.24)
GFR calc non Af Amer: 7 mL/min — ABNORMAL LOW (ref >60)
GFR calc non Af Amer: 7 mL/min — ABNORMAL LOW (ref >60)
GFR calc non Af Amer: 7 mL/min — ABNORMAL LOW (ref >60)
GFR, EST AFRICAN AMERICAN: 8 mL/min — AB (ref >60)
GFR, EST AFRICAN AMERICAN: 8 mL/min — AB (ref >60)
GFR, EST AFRICAN AMERICAN: 8 mL/min — AB (ref >60)
Glucose, Bld: 324 mg/dL — ABNORMAL HIGH (ref 65–99)
Glucose, Bld: 344 mg/dL — ABNORMAL HIGH (ref 65–99)
Glucose, Bld: 341 mg/dL — ABNORMAL HIGH (ref 65–99)
Potassium: 5.3 mmol/L — ABNORMAL HIGH (ref 3.5–5.1)
Potassium: 6.2 mmol/L — ABNORMAL HIGH (ref 3.5–5.1)
Potassium: 5.5 mmol/L — ABNORMAL HIGH (ref 3.5–5.1)
SODIUM: 131 mmol/L — AB (ref 135–145)
SODIUM: 129 mmol/L — AB (ref 135–145)
SODIUM: 131 mmol/L — AB (ref 135–145)

## 2016-05-27 LAB — LIPID PANEL
Cholesterol: 82 mg/dL (ref 0–200)
HDL: 14 mg/dL — ABNORMAL LOW (ref >40)
LDL CALC: 7 mg/dL (ref 0–99)
TRIGLYCERIDES: 306 mg/dL — AB (ref <150)
Total CHOL/HDL Ratio: 5.9 RATIO
VLDL: 61 mg/dL — AB (ref 0–40)

## 2016-05-27 LAB — COMPREHENSIVE METABOLIC PANEL
ALT: 113 U/L — AB (ref 17–63)
ANION GAP: 13 (ref 5–15)
AST: 355 U/L — ABNORMAL HIGH (ref 15–41)
Albumin: 2.8 g/dL — ABNORMAL LOW (ref 3.5–5.0)
Alkaline Phosphatase: 118 U/L (ref 38–126)
BUN: 55 mg/dL — ABNORMAL HIGH (ref 6–20)
CHLORIDE: 97 mmol/L — AB (ref 101–111)
CO2: 18 mmol/L — AB (ref 22–32)
CREATININE: 5.96 mg/dL — AB (ref 0.61–1.24)
Calcium: 8.8 mg/dL — ABNORMAL LOW (ref 8.9–10.3)
GFR calc non Af Amer: 8 mL/min — ABNORMAL LOW (ref >60)
GFR, EST AFRICAN AMERICAN: 10 mL/min — AB (ref >60)
Glucose, Bld: 348 mg/dL — ABNORMAL HIGH (ref 65–99)
POTASSIUM: 5.2 mmol/L — AB (ref 3.5–5.1)
SODIUM: 128 mmol/L — AB (ref 135–145)
Total Bilirubin: 1.6 mg/dL — ABNORMAL HIGH (ref 0.3–1.2)
Total Protein: 6.6 g/dL (ref 6.5–8.1)

## 2016-05-27 LAB — BLOOD CULTURE ID PANEL (REFLEXED)
ACINETOBACTER BAUMANNII: NOT DETECTED
CANDIDA ALBICANS: NOT DETECTED
CANDIDA GLABRATA: NOT DETECTED
Candida krusei: NOT DETECTED
Candida parapsilosis: NOT DETECTED
Candida tropicalis: NOT DETECTED
Carbapenem resistance: NOT DETECTED
ENTEROBACTER CLOACAE COMPLEX: NOT DETECTED
ENTEROBACTERIACEAE SPECIES: DETECTED — AB
ESCHERICHIA COLI: DETECTED — AB
Enterococcus species: NOT DETECTED
HAEMOPHILUS INFLUENZAE: NOT DETECTED
Klebsiella oxytoca: NOT DETECTED
Klebsiella pneumoniae: NOT DETECTED
LISTERIA MONOCYTOGENES: NOT DETECTED
NEISSERIA MENINGITIDIS: NOT DETECTED
PSEUDOMONAS AERUGINOSA: NOT DETECTED
Proteus species: NOT DETECTED
STREPTOCOCCUS AGALACTIAE: NOT DETECTED
STREPTOCOCCUS PNEUMONIAE: NOT DETECTED
STREPTOCOCCUS PYOGENES: NOT DETECTED
STREPTOCOCCUS SPECIES: NOT DETECTED
Serratia marcescens: NOT DETECTED
Staphylococcus aureus (BCID): NOT DETECTED
Staphylococcus species: NOT DETECTED

## 2016-05-27 LAB — CK
CK TOTAL: 28316 U/L — AB (ref 49–397)
CK TOTAL: 25261 U/L — AB (ref 49–397)
Total CK: 25775 U/L — ABNORMAL HIGH (ref 49–397)
Total CK: 28687 U/L — ABNORMAL HIGH (ref 49–397)
Total CK: 22886 U/L — ABNORMAL HIGH (ref 49–397)

## 2016-05-27 LAB — RENAL FUNCTION PANEL
ANION GAP: 13 (ref 5–15)
Albumin: 2.6 g/dL — ABNORMAL LOW (ref 3.5–5.0)
Albumin: 2.4 g/dL — ABNORMAL LOW (ref 3.5–5.0)
Anion gap: 13 (ref 5–15)
BUN: 68 mg/dL — AB (ref 6–20)
BUN: 70 mg/dL — AB (ref 6–20)
CALCIUM: 8.4 mg/dL — AB (ref 8.9–10.3)
CHLORIDE: 100 mmol/L — AB (ref 101–111)
CHLORIDE: 100 mmol/L — AB (ref 101–111)
CO2: 19 mmol/L — AB (ref 22–32)
CO2: 21 mmol/L — AB (ref 22–32)
CREATININE: 7.49 mg/dL — AB (ref 0.61–1.24)
Calcium: 8.4 mg/dL — ABNORMAL LOW (ref 8.9–10.3)
Creatinine, Ser: 7.53 mg/dL — ABNORMAL HIGH (ref 0.61–1.24)
GFR calc Af Amer: 7 mL/min — ABNORMAL LOW (ref >60)
GFR calc non Af Amer: 6 mL/min — ABNORMAL LOW (ref >60)
GFR calc non Af Amer: 6 mL/min — ABNORMAL LOW (ref >60)
GFR, EST AFRICAN AMERICAN: 7 mL/min — AB (ref >60)
GLUCOSE: 249 mg/dL — AB (ref 65–99)
Glucose, Bld: 315 mg/dL — ABNORMAL HIGH (ref 65–99)
POTASSIUM: 4.9 mmol/L (ref 3.5–5.1)
Phosphorus: 3.5 mg/dL (ref 2.5–4.6)
Phosphorus: 3.1 mg/dL (ref 2.5–4.6)
Potassium: 5.2 mmol/L — ABNORMAL HIGH (ref 3.5–5.1)
SODIUM: 132 mmol/L — AB (ref 135–145)
Sodium: 134 mmol/L — ABNORMAL LOW (ref 135–145)

## 2016-05-27 LAB — I-STAT ARTERIAL BLOOD GAS, ED
ACID-BASE DEFICIT: 8 mmol/L — AB (ref 0.0–2.0)
BICARBONATE: 15.5 mmol/L — AB (ref 20.0–28.0)
O2 Saturation: 96 %
PH ART: 7.336 — AB (ref 7.350–7.450)
TCO2: 16 mmol/L (ref 0–100)
pCO2 arterial: 29.7 mmHg — ABNORMAL LOW (ref 32.0–48.0)
pO2, Arterial: 98 mmHg (ref 83.0–108.0)

## 2016-05-27 LAB — URINALYSIS, ROUTINE W REFLEX MICROSCOPIC
GLUCOSE, UA: 250 mg/dL — AB
KETONES UR: 15 mg/dL — AB
NITRITE: NEGATIVE
PH: 6.5 (ref 5.0–8.0)
Protein, ur: >300 mg/dL — AB
Specific Gravity, Urine: 1.021 (ref 1.005–1.030)

## 2016-05-27 LAB — CBC
HCT: 33.7 % — ABNORMAL LOW (ref 39.0–52.0)
Hemoglobin: 11.7 g/dL — ABNORMAL LOW (ref 13.0–17.0)
MCH: 30.7 pg (ref 26.0–34.0)
MCHC: 34.7 g/dL (ref 30.0–36.0)
MCV: 88.5 fL (ref 78.0–100.0)
PLATELETS: 89 10*3/uL — AB (ref 150–400)
RBC: 3.81 MIL/uL — AB (ref 4.22–5.81)
RDW: 14.7 % (ref 11.5–15.5)
WBC: 6.9 10*3/uL (ref 4.0–10.5)

## 2016-05-27 LAB — ECHOCARDIOGRAM COMPLETE
CHL CUP TV REG PEAK VELOCITY: 308 cm/s
FS: 18 % — AB (ref 28–44)
HEIGHTINCHES: 73 in
IV/PV OW: 1.11
LA vol A4C: 51.1 ml
LADIAMINDEX: 1.48 cm/m2
LASIZE: 33 mm
LAVOL: 62.4 mL
LAVOLIN: 28 mL/m2
LDCA: 3.46 cm2
LEFT ATRIUM END SYS DIAM: 33 mm
LV PW d: 9.94 mm — AB (ref 0.6–1.1)
LV TDI E'MEDIAL: 4.81
LV e' LATERAL: 6.2 cm/s
LVOT diameter: 21 mm
Lateral S' vel: 18 cm/s
RV sys press: 41 mmHg
TAPSE: 16.1 mm
TDI e' lateral: 6.2
TRMAXVEL: 308 cm/s
WEIGHTICAEL: 3360 [oz_av]

## 2016-05-27 LAB — CBG MONITORING, ED
GLUCOSE-CAPILLARY: 323 mg/dL — AB (ref 65–99)
GLUCOSE-CAPILLARY: 312 mg/dL — AB (ref 65–99)
Glucose-Capillary: 296 mg/dL — ABNORMAL HIGH (ref 65–99)

## 2016-05-27 LAB — LACTIC ACID, PLASMA
LACTIC ACID, VENOUS: 2.1 mmol/L — AB (ref 0.5–1.9)
LACTIC ACID, VENOUS: 2.2 mmol/L — AB (ref 0.5–1.9)

## 2016-05-27 LAB — URINE MICROSCOPIC-ADD ON

## 2016-05-27 LAB — MAGNESIUM: MAGNESIUM: 1.7 mg/dL (ref 1.7–2.4)

## 2016-05-27 LAB — TROPONIN I
TROPONIN I: 0.12 ng/mL — AB (ref <0.03)
TROPONIN I: 0.15 ng/mL — AB (ref <0.03)
TROPONIN I: 0.18 ng/mL — AB (ref <0.03)
TROPONIN I: 0.48 ng/mL — AB (ref <0.03)

## 2016-05-27 LAB — MRSA PCR SCREENING: MRSA BY PCR: NEGATIVE

## 2016-05-27 LAB — LIPASE, BLOOD: Lipase: 20 U/L (ref 11–51)

## 2016-05-27 LAB — GLUCOSE, CAPILLARY
GLUCOSE-CAPILLARY: 278 mg/dL — AB (ref 65–99)
Glucose-Capillary: 335 mg/dL — ABNORMAL HIGH (ref 65–99)

## 2016-05-27 LAB — INFLUENZA PANEL BY PCR (TYPE A & B)
Influenza A By PCR: NEGATIVE
Influenza B By PCR: NEGATIVE

## 2016-05-27 LAB — POTASSIUM: Potassium: 4.9 mmol/L (ref 3.5–5.1)

## 2016-05-27 MED ORDER — SODIUM CHLORIDE 0.9 % IV SOLN: INTRAVENOUS | Status: DC

## 2016-05-27 MED ORDER — INSULIN NPH (HUMAN) (ISOPHANE) 100 UNIT/ML ~~LOC~~ SUSP: 5.0000 [IU] | Freq: Two times a day (BID) | SUBCUTANEOUS | Status: DC

## 2016-05-27 MED ORDER — INSULIN GLARGINE 100 UNIT/ML ~~LOC~~ SOLN
14.0000 [IU] | SUBCUTANEOUS | Status: DC
  Administered 2016-05-27 – 2016-06-06 (×11): 14 [IU] via SUBCUTANEOUS
  Filled 2016-05-27 (×12): qty 0.14

## 2016-05-27 MED ORDER — PIPERACILLIN-TAZOBACTAM 3.375 G IVPB
3.3750 g | Freq: Two times a day (BID) | INTRAVENOUS | Status: DC
  Administered 2016-05-27 – 2016-05-28 (×2): 3.375 g via INTRAVENOUS
  Filled 2016-05-27 (×3): qty 50

## 2016-05-27 MED ORDER — PLASMA-LYTE 148 IV SOLN
INTRAVENOUS | Status: DC
  Administered 2016-05-27: 150 mL via INTRAVENOUS

## 2016-05-27 MED ORDER — SODIUM CHLORIDE 0.9 % IV SOLN
INTRAVENOUS | Status: DC
  Administered 2016-05-27: 06:00:00 via INTRAVENOUS

## 2016-05-27 MED ORDER — ACETAMINOPHEN 325 MG PO TABS: 650.0000 mg | ORAL_TABLET | Freq: Four times a day (QID) | ORAL | Status: DC | PRN

## 2016-05-27 MED ORDER — HEPARIN SODIUM (PORCINE) 5000 UNIT/ML IJ SOLN: 5000.0000 [IU] | Freq: Three times a day (TID) | INTRAMUSCULAR | Status: DC

## 2016-05-27 MED ORDER — FAMOTIDINE 20 MG PO TABS
20.0000 mg | ORAL_TABLET | Freq: Every day | ORAL | Status: DC
  Administered 2016-05-28 – 2016-06-01 (×5): 20 mg via ORAL
  Filled 2016-05-27 (×5): qty 1

## 2016-05-27 MED ORDER — ONDANSETRON HCL 4 MG/2ML IJ SOLN
4.0000 mg | Freq: Four times a day (QID) | INTRAMUSCULAR | Status: DC | PRN
  Administered 2016-05-27 (×2): 4 mg via INTRAVENOUS
  Filled 2016-05-27 (×3): qty 2

## 2016-05-27 MED ORDER — FAMOTIDINE 20 MG PO TABS: 20.0000 mg | ORAL_TABLET | Freq: Two times a day (BID) | ORAL | Status: DC

## 2016-05-27 MED ORDER — INSULIN ASPART 100 UNIT/ML ~~LOC~~ SOLN
0.0000 [IU] | SUBCUTANEOUS | Status: DC
  Administered 2016-05-27: 7 [IU] via SUBCUTANEOUS
  Administered 2016-05-27: 5 [IU] via SUBCUTANEOUS
  Filled 2016-05-27 (×2): qty 1

## 2016-05-27 MED ORDER — STERILE WATER FOR INJECTION IV SOLN
INTRAVENOUS | Status: DC
  Administered 2016-05-27 – 2016-05-31 (×3): via INTRAVENOUS
  Filled 2016-05-27 (×6): qty 850

## 2016-05-27 MED ORDER — SODIUM CHLORIDE 0.9 % IV SOLN
1.0000 g | Freq: Once | INTRAVENOUS | Status: AC
  Administered 2016-05-27: 1 g via INTRAVENOUS
  Filled 2016-05-27: qty 10

## 2016-05-27 MED ORDER — SODIUM POLYSTYRENE SULFONATE 15 GM/60ML PO SUSP
45.0000 g | Freq: Once | ORAL | Status: AC
  Administered 2016-05-27: 45 g via ORAL
  Filled 2016-05-27: qty 180

## 2016-05-27 MED ORDER — HEPARIN SODIUM (PORCINE) 5000 UNIT/ML IJ SOLN
5000.0000 [IU] | Freq: Three times a day (TID) | INTRAMUSCULAR | Status: DC
  Administered 2016-05-27 – 2016-06-06 (×28): 5000 [IU] via SUBCUTANEOUS
  Filled 2016-05-27 (×27): qty 1

## 2016-05-27 MED ORDER — ACETAMINOPHEN 650 MG RE SUPP
650.0000 mg | Freq: Four times a day (QID) | RECTAL | Status: DC | PRN
  Administered 2016-05-27 – 2016-05-28 (×3): 650 mg via RECTAL
  Filled 2016-05-27 (×3): qty 1

## 2016-05-27 MED ORDER — INSULIN ASPART 100 UNIT/ML ~~LOC~~ SOLN
0.0000 [IU] | SUBCUTANEOUS | Status: DC
  Administered 2016-05-27: 15 [IU] via SUBCUTANEOUS
  Administered 2016-05-27: 11 [IU] via SUBCUTANEOUS
  Administered 2016-05-27: 15 [IU] via SUBCUTANEOUS
  Administered 2016-05-28: 7 [IU] via SUBCUTANEOUS
  Administered 2016-05-28: 11 [IU] via SUBCUTANEOUS
  Administered 2016-05-28: 4 [IU] via SUBCUTANEOUS
  Administered 2016-05-28: 3 [IU] via SUBCUTANEOUS
  Administered 2016-05-28: 7 [IU] via SUBCUTANEOUS
  Administered 2016-05-29 (×2): 4 [IU] via SUBCUTANEOUS
  Administered 2016-05-29: 7 [IU] via SUBCUTANEOUS
  Administered 2016-05-29: 11 [IU] via SUBCUTANEOUS
  Administered 2016-05-29: 4 [IU] via SUBCUTANEOUS
  Administered 2016-05-30: 8 [IU] via SUBCUTANEOUS
  Administered 2016-05-30: 7 [IU] via SUBCUTANEOUS
  Administered 2016-05-30: 4 [IU] via SUBCUTANEOUS
  Administered 2016-05-30: 3 [IU] via SUBCUTANEOUS
  Administered 2016-05-30: 15 [IU] via SUBCUTANEOUS
  Administered 2016-05-30: 4 [IU] via SUBCUTANEOUS
  Administered 2016-05-31: 3 [IU] via SUBCUTANEOUS
  Administered 2016-05-31: 7 [IU] via SUBCUTANEOUS
  Administered 2016-05-31: 11 [IU] via SUBCUTANEOUS
  Administered 2016-05-31 – 2016-06-01 (×3): 4 [IU] via SUBCUTANEOUS
  Administered 2016-06-01: 3 [IU] via SUBCUTANEOUS
  Administered 2016-06-01 – 2016-06-02 (×2): 4 [IU] via SUBCUTANEOUS
  Administered 2016-06-02 (×2): 3 [IU] via SUBCUTANEOUS
  Administered 2016-06-02: 4 [IU] via SUBCUTANEOUS
  Administered 2016-06-02: 7 [IU] via SUBCUTANEOUS
  Administered 2016-06-03 (×5): 4 [IU] via SUBCUTANEOUS
  Administered 2016-06-04: 11 [IU] via SUBCUTANEOUS
  Administered 2016-06-04 (×2): 7 [IU] via SUBCUTANEOUS
  Administered 2016-06-05: 4 [IU] via SUBCUTANEOUS
  Administered 2016-06-05: 7 [IU] via SUBCUTANEOUS
  Administered 2016-06-05 – 2016-06-06 (×4): 4 [IU] via SUBCUTANEOUS
  Administered 2016-06-06: 7 [IU] via SUBCUTANEOUS
  Administered 2016-06-06: 3 [IU] via SUBCUTANEOUS
  Administered 2016-06-06 (×2): 4 [IU] via SUBCUTANEOUS
  Filled 2016-05-27: qty 1

## 2016-05-27 MED ORDER — SIMVASTATIN 20 MG PO TABS: 20.0000 mg | ORAL_TABLET | Freq: Every day | ORAL | Status: DC

## 2016-05-27 NOTE — ED Notes (Signed)
Patient was given PO tylenol and threw up immediately.  MD gardner was aware ordered Zofran and patient tolerated well.  Patient has not had emesis since administration of Zofran at 3a

## 2016-05-27 NOTE — ED Notes (Signed)
Echo tech at bedside.

## 2016-05-27 NOTE — ED Notes (Signed)
Patient has returned from being out of the department; patient placed back on monitor, continuous pulse oximetry, blood pressure cuff and oxygen [place] (2L) 

## 2016-05-27 NOTE — ED Notes (Signed)
Patient here for shortness of breath and weakness.  Patient was seen at urgent care yesterday and discharged home with a diagnosis of viral infection.  Patient presents with audible wheezes and labored breathing.  Patient is not diaphoretic but had a rectal temp of 102.  Tylenol suppository was given.   Troponin is elevated at 0.15. Lactic was 3.34, had approx 3L bolus to try and help compensate for this. Plan is to have patient admitted to stepdown to further evaluate cause of sepsis.  Patient is still confused and answers yes to almost anything.

## 2016-05-27 NOTE — ED Notes (Signed)
POCT CBG resulted 312; Jess, RN aware

## 2016-05-27 NOTE — Progress Notes (Signed)
PHARMACY - PHYSICIAN COMMUNICATION CRITICAL VALUE ALERT - BLOOD CULTURE IDENTIFICATION (BCID)  Results for orders placed or performed during the hospital encounter of 05/26/16  Blood Culture ID Panel (Reflexed) (Collected: 05/26/2016 11:55 PM)  Result Value Ref Range   Enterococcus species NOT DETECTED NOT DETECTED   Listeria monocytogenes NOT DETECTED NOT DETECTED   Staphylococcus species NOT DETECTED NOT DETECTED   Staphylococcus aureus NOT DETECTED NOT DETECTED   Streptococcus species NOT DETECTED NOT DETECTED   Streptococcus agalactiae NOT DETECTED NOT DETECTED   Streptococcus pneumoniae NOT DETECTED NOT DETECTED   Streptococcus pyogenes NOT DETECTED NOT DETECTED   Acinetobacter baumannii NOT DETECTED NOT DETECTED   Enterobacteriaceae species DETECTED (A) NOT DETECTED   Enterobacter cloacae complex NOT DETECTED NOT DETECTED   Escherichia coli DETECTED (A) NOT DETECTED   Klebsiella oxytoca NOT DETECTED NOT DETECTED   Klebsiella pneumoniae NOT DETECTED NOT DETECTED   Proteus species NOT DETECTED NOT DETECTED   Serratia marcescens NOT DETECTED NOT DETECTED   Carbapenem resistance NOT DETECTED NOT DETECTED   Haemophilus influenzae NOT DETECTED NOT DETECTED   Neisseria meningitidis NOT DETECTED NOT DETECTED   Pseudomonas aeruginosa NOT DETECTED NOT DETECTED   Candida albicans NOT DETECTED NOT DETECTED   Candida glabrata NOT DETECTED NOT DETECTED   Candida krusei NOT DETECTED NOT DETECTED   Candida parapsilosis NOT DETECTED NOT DETECTED   Candida tropicalis NOT DETECTED NOT DETECTED    Name of physician (or Provider) Contacted: C. Woods  Changes to prescribed antibiotics required: Blood cx is 2/2 with GNRs. BCID reports as E. Coli. Continues on Zosyn. Also seems to have some cholecystitis going on. Patient very sick and MD plans to continue Zosyn today to make sure they improve with plans to de-escalate to Rocephin tomorrow.  Enzo Bi, PharmD, BCPS Clinical  Pharmacist Pager [phone] 05/27/2016 1:19 PM

## 2016-05-27 NOTE — ED Notes (Signed)
323 cbg

## 2016-05-27 NOTE — Progress Notes (Addendum)
Patient breathing 40 ish times a min, tachy to 120s, fever still at 102.9.  Tylenol was just now given.  Continuous IVF just now started. 1) IVF orders changed to NS at 100 cc/hr + bicarb gtt at 75 cc/hr for presumed RTA causing NAG metabolic acidosis 2) ABG ordered and reviewed 3) UTI findings on UA noted 4) repeat lactate still pending from 0200 5) trop elevation to 0.48, patient remains CP free, patient likely with demand ischemia due to persistent sinus tachycardia from sepsis. 6) EDP put on notice for possible need for intubation if patient status declines. 7) likely warrants nephrology consultation later this AM due to worsening AKF 8) Q4H BMPs ordered while on bicarb gtt

## 2016-05-27 NOTE — Progress Notes (Signed)
Pharmacy Antibiotic Note  Jonathan Waller is a 75 y.o. male admitted on 05/26/2016 with AMS/weakness, ARF and possible sepsis.  Pharmacy has been consulted for Vancomycin and Zosyn dosing.  Vancomycin 1500 mg IV given in ED at  0115    Plan: Zosyn 3.375 g IV q8h  F/U AM BMet and redose Vancomycin once SCr improves/stabilizes   Height: 6\' 1"  (185.4 cm) Weight: 210 lb (95.3 kg) IBW/kg (Calculated) : 79.9  Temp (24hrs), Avg:100.9 F (38.3 C), Min:99.1 F (37.3 C), Max:102.7 F (39.3 C)   Recent Labs Lab 05/26/16 2305 05/26/16 2313  WBC 10.3  --   CREATININE 4.78*  --   LATICACIDVEN  --  3.34*    Estimated Creatinine Clearance: 15.1 mL/min (by C-G formula based on SCr of 4.78 mg/dL (H)).    Allergies  Allergen Reactions  . Aspirin Nausea And Vomiting     Eddie Candle 05/27/2016 1:52 AM

## 2016-05-27 NOTE — Significant Event (Addendum)
Now patient with multiple episodes of emesis.  Possibly blood in emesis per RN:  On my evaluation it looks more brown in color.  Per Daughter patients diarrhea was NOT melanotic, not bloody. 1) heparin DVT ppx canceled (platelets borderline at 95 anyhow) 2) gastroccult ordered 3) looks more brown in color to me than actually bloody at this time. 4) repeat CBC in AM is ordered, HGB on presentation 11.2

## 2016-05-27 NOTE — ED Notes (Signed)
Pt states he got Tylenol PO instead of suppository and pt vomited right after.

## 2016-05-27 NOTE — Progress Notes (Signed)
CSW consult acknowledged re: substance abuse. Patient and Patient's daughter deny any form of substance abuse. Patient and daughter report that Patient does not even smoke or drink. No evidence of substance abuse suggested in previous notes. No toxicology report available. CSW signing off at this time. Please consult should new need(s) arise.          Lance Muss, LCSW [facility] ED/39M Clinical Social Worker [phone]

## 2016-05-27 NOTE — Consult Note (Signed)
PULMONARY / CRITICAL CARE MEDICINE   Name: Jonathan Waller MRN: [id number] DOB: 03-27-1941    ADMISSION DATE:  05/26/2016 CONSULTATION DATE:  05/27/2016  REFERRING MD:  Joseph Art, triad  CHIEF COMPLAINT:  Respiratory distress, renal failure  HISTORY OF PRESENT ILLNESS:   75 year old man who is visiting his daughter from [place], history of right nephrectomy, admitted 11/27 with generalized weakness and fevers, labs showed rhabdomyolysis with CK of 20 5K, creatinine 4.7, lactate of 3.3, WBC 10 k, blood cultures subsequently growing Escherichia coli. He was treated with broad-spectrum antibiotics, admitted to stepdown, creatinine has now increased to 6.9, CK has increased to 28K, renal and cardiology consulted PCCM is consulted due to concern for respiratory failure especially with fluid and bicarbonate infusions Ultrasound abdomen is suggestive acalculous cholecystitis and no hydronephrosis was noted  He denies abdominal pain, reports burning in urine  PAST MEDICAL HISTORY :  He  has a past medical history of GERD (gastroesophageal reflux disease); Gout; HLD (hyperlipidemia); HTN (hypertension); and IDDM (insulin dependent diabetes mellitus) (HCC).  PAST SURGICAL HISTORY: He  has no past surgical history on file.  Allergies  Allergen Reactions  . Aspirin Nausea And Vomiting    No current facility-administered medications on file prior to encounter.    No current outpatient prescriptions on file prior to encounter.    FAMILY HISTORY:  His has no family status information on file.    SOCIAL HISTORY: He  reports that he has never smoked. He has never used smokeless tobacco. He reports that he does not drink alcohol.  REVIEW OF SYSTEMS:   Positive for fevers, burning in urine, oliguria over the last 24 hours, child has weakness Positive for wheezing which has now subsided Denies weight loss No hematuria No abdominal pain, loose stools or vomiting No cough, pedal edema,  orthopnea  SUBJECTIVE:    VITAL SIGNS: BP 137/80 (BP Location: Right Arm)   Pulse (!) 103   Temp 98.2 F (36.8 C) (Oral)   Resp (!) 24   Ht 6\' 2"  (1.88 m)   Wt 272 lb 1.6 oz (123.4 kg)   SpO2 98%   BMI 34.94 kg/m   HEMODYNAMICS:    VENTILATOR SETTINGS:    INTAKE / OUTPUT: I/O last 3 completed shifts: In: 3009 [I.V.:3009] Out: -   PHYSICAL EXAMINATION: Obese man lying supine in bed in mild respiratory distress, able to speak in full sentences No pallor, icterus Decreased breath sounds bilateral, no rhonchi S1 and S2 normal, no S3 or rub Soft, nondistended, nontender abdomen, no guarding Grossly nonfocal, alert and interactive no asterixis No pedal edema  LABS:  BMET  Recent Labs Lab 05/27/16 0853 05/27/16 1212 05/27/16 1431  NA 131* 129* 131*  K 5.3* 6.2* 5.5*  CL 101 100* 101  CO2 16* 15* 17*  BUN 58* 63* 63*  CREATININE 6.60* 6.84* 6.97*  GLUCOSE 324* 344* 341*    Electrolytes  Recent Labs Lab 05/27/16 0853 05/27/16 1212 05/27/16 1431  CALCIUM 8.6* 8.2* 8.4*  MG 1.7  --   --     CBC  Recent Labs Lab 05/26/16 2305 05/27/16 0259  WBC 10.3 6.9  HGB 11.2* 11.7*  HCT 32.6* 33.7*  PLT 95* 89*    Coag's No results for input(s): APTT, INR in the last 168 hours.  Sepsis Markers  Recent Labs Lab 05/26/16 2313 05/27/16 0508 05/27/16 0853  LATICACIDVEN 3.34* 2.1* 2.2*    ABG  Recent Labs Lab 05/27/16 0430  PHART 7.336*  PCO2ART 29.7*  PO2ART 98.0    Liver Enzymes  Recent Labs Lab 05/26/16 2305 05/27/16 0259  AST 310* 355*  ALT 101* 113*  ALKPHOS 110 118  BILITOT 1.2 1.6*  ALBUMIN 2.8* 2.8*    Cardiac Enzymes  Recent Labs Lab 05/27/16 0025 05/27/16 0259 05/27/16 1212  TROPONINI 0.15* 0.48* 0.18*    Glucose  Recent Labs Lab 05/27/16 0327 05/27/16 0955 05/27/16 1233 05/27/16 1623  GLUCAP 296* 323* 312* 335*    Imaging Dg Chest 2 View  Result Date: 05/26/2016 CLINICAL DATA:  Generalized  weakness for several weeks.  Dyspnea. EXAM: CHEST  2 VIEW COMPARISON:  None. FINDINGS: AP semi upright and lateral views are provided. Lung volumes are slightly low with mild interstitial edema. No pneumonic consolidation, significant pleural fluid collections or pneumothoraces. The cardiac silhouette is marginally enlarged but this could be due to the AP projection. The aorta is not aneurysmal. No acute osseous abnormality. IMPRESSION: Mild interstitial prominence which may reflect interstitial edema. Mild cardiomegaly but this could be due to the AP projection. Electronically Signed   By: Tollie Eth M.D.   On: 05/26/2016 23:00   US Renal  Result Date: 05/27/2016 CLINICAL DATA:  Status post right nephrectomy.  Acute kidney injury. EXAM: RENAL / URINARY TRACT ULTRASOUND COMPLETE COMPARISON:  None. FINDINGS: Right Kidney: The right kidney is surgically absent. The right renal fossa was scanned and unremarkable. Left Kidney: Length: 13.2 cm. There are multiple left renal cysts, the largest of which measures 4.8 x 4.8 x 4.8 cm, located near the lower pole. Bladder: The bladder was nondistended. IMPRESSION: 1. Status post right nephrectomy.  Unremarkable right renal fossa. 2. Multiple left renal cysts, but otherwise normal sonographic appearance of the left kidney. Electronically Signed   By: Deatra Robinson M.D.   On: 05/27/2016 01:24   Dg Chest Port 1 View  Result Date: 05/27/2016 CLINICAL DATA:  Shortness of breath EXAM: PORTABLE CHEST 1 VIEW COMPARISON:  05/26/2016 FINDINGS: Normal heart size with mild central vascular congestion. No focal pneumonia, collapse or consolidation. Negative for edema, effusion or pneumothorax. Trachea is midline. Atherosclerosis of the aorta. No acute osseous finding. IMPRESSION: Mild central vascular congestion without CHF or focal pneumonia. Electronically Signed   By: Judie Petit.  Shick M.D.   On: 05/27/2016 17:09   US Abdomen Limited Ruq  Result Date: 05/27/2016 CLINICAL DATA:   Fever with right abdominal pain EXAM: US ABDOMEN LIMITED - RIGHT UPPER QUADRANT COMPARISON:  None. FINDINGS: Gallbladder: No gallstones are evident. The gallbladder wall appears thickened and edematous. Minimal pericholecystic fluid noted. No sonographic Murphy sign noted by sonographer; note, however, the patient was given pain medication prior to this study which could mask focal tenderness to interrogation. Common bile duct: Diameter: 6 mm. No intrahepatic or extrahepatic biliary duct dilatation. Liver: In the anterior segment of the right lobe of the liver, there is a 2.8 x 2.2 x 2.8 cm cystic area containing minimal septations. No other focal liver lesion evident. Within normal limits in parenchymal echogenicity. IMPRESSION: The appearance of the gallbladder is concerning for acalculus cholecystitis. Cystic area containing minimal septations in the anterior segment right lobe of the liver. This area appears benign. No biliary duct dilatation evident. Electronically Signed   By: Bretta Bang III M.D.   On: 05/27/2016 09:49     STUDIES:  11/28 [place] abd >> gallbladder wall thickening and edema c/w a calculus cholecystitis, liver cyst 11/28 US renal's >> right nephrectomy, left renal cysts, no hydronephrosis Echo - EF 40%,  WMA =  CULTURES: 11/27 blood >> GNr >>  ANTIBIOTICS: 11/27 zosyn >>  SIGNIFICANT EVENTS:   LINES/TUBES:   DISCUSSION: Escherichia coli sepsis causing AKI and rhabdomyolysis, some concern for fluid overload with fluid infusions  ASSESSMENT / PLAN:  PULMONARY A: Acute respiratory distress-subsided P:   bipap prn  CARDIOVASCULAR A:  cardiomyopathy ? Of sepsis P:  Reassess once acute issues resolved  RENAL A:   AKI - due to sepsis, rhabdomyolysis with aldactone/ ARB on board P:   Bicarb gtt @ 125/h  GASTROINTESTINAL A:   Acute acalculous cholecystitis P:   Follow LFts If worsen, may need chole drain  HEMATOLOGIC A:   thrombocytopenia P:   follow  INFECTIOUS A:   E coli sepsis P:   Zosyn ct until sens back  ENDOCRINE A:   Dm-2, uncontrolled   P:   SSI- resistant  NEUROLOGIC A:   No issues P:      FAMILY  - Updates: daughter at bedside  - Inter-disciplinary family meet or Palliative Care meeting due by:  NA  [facility]   Cyril Mourningakesh Heru Montz MD. FCCP. [facility] Pager [phone] If no response call [phone]    05/27/2016, 5:12 PM

## 2016-05-27 NOTE — Progress Notes (Signed)
Inpatient Diabetes Program Recommendations  AACE/ADA: New Consensus Statement on Inpatient Glycemic Control (2015)  Target Ranges:  Prepandial:   less than 140 mg/dL      Peak postprandial:   less than 180 mg/dL (1-2 hours)      Critically ill patients:  140 - 180 mg/dL   Results for YEUDIEL, FAINE (MRN [id number]) as of 05/27/2016 11:52  Ref. Range 05/27/2016 03:27 05/27/2016 09:55  Glucose-Capillary Latest Ref Range: 65 - 99 mg/dL 354 (H) 656 (H)   Review of Glycemic Control  Diabetes history: DM2 Outpatient Diabetes medications: Novolog 70/30 0-20 units TID with meals Current orders for Inpatient glycemic control: NPH 5 units BID, Novolog 0-20 units Q4H  Inpatient Diabetes Program Recommendations: Insulin - Basal: Please consider discontinuing NPH and ordering Lantus 14 units Q24H (based on 95 kg x 0.15 units). HgbA1C: A1C in process.  Thanks, Orlando Penner, RN, MSN, CDE Diabetes Coordinator Inpatient Diabetes Program [phone] (Team Pager from 8am to 5pm)

## 2016-05-27 NOTE — ED Notes (Signed)
Cancelled diet tray

## 2016-05-27 NOTE — Progress Notes (Addendum)
PROGRESS NOTE    Jonathan Waller  ZOX:[id number] DOB: 31-Jul-1940 DOA: 05/26/2016 PCP: Pcp Not In System   Brief Narrative:  74 y.o. male with PMHx DM Type 2 uncontrolled with complication , HTN, Solitary Kidney, HLD,.    Patient is in town visiting family for thanksgiving.  2 days ago started to develop generalized weakness, SOB, productive cough, urinary urgency.  Today fell twice.  Was down for up to a couple of hours after first episode.  No LOC.  Fever 101 at home.  Has been having diarrhea today.  Intermittent confusion for past 2 days (at baseline lives at home alone).  Patient brought to ED.  ED Course: In ED patient is septic, has kidney failure with creat of 4.5 (presumably acute), mild transaminitis, CK elevated at 25k (patient has rhabdomyolysis).  Given empiric zosyn and vanc.  UA pending, CXR (see read) is basically radiologist hedging.  Trop os 0.13.  Lactate 3.34, BMP also shows NAG metabolic acidosis with bicarb of 16.   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 Unknown baseline Cr. Not on home O2. Currently states negative CP, negative SOB, negative abdominal pain.    Assessment & Plan:   Principal Problem:   Sepsis (HCC) Active Problems:   IDDM (insulin dependent diabetes mellitus) (HCC)   HTN (hypertension)   AKI (acute kidney injury) (HCC)   Delirium   Rhabdomyolysis   Transaminitis   Diarrhea   Severe Sepsis -PNA vs Diarrhea vs UTI -Zosyn and vanc empirically -Blood, urine, stool culture pending, -Decrease Normal saline 50 ml/hr -Sodium bicarbonate 64ml/hr  Acute renal failure (unknown baseline) -Renal ultrasound shows S/P right nephrectomy, multiple left renal cysts. Lab Results  Component Value Date   CREATININE 5.96 (H) 05/27/2016   CREATININE 4.78 (H) 05/26/2016  -Strict in and out -Hold all nephrotoxic medication -Nephrology pending spoke with Dr. Fayrene Fearing Deterding    Hyperkalemia -Calcium gluconate 1 g -Kayexalate 45 g  Demand ischemia vs MI/Acute Systolic CHF -Patient with abnormal EKG, positive troponins R/O MI vs demand ischemia. -Stat echocardiogram ordered -Strict in and out -Daily weight -Consulted Cardiology   Pulmonary hypertension -See demand ischemia   Rhabdomyolysis -Unknown cause -Continue fluid resuscitation. -CK q 4hr  Transaminitis -Rhabdomyolysis? -Cholangitis? Patient has elevated AST, ALT and total bilirubin. Obtain RUQ abdominal ultrasound -Lipase: WNL  Acalculus Cholecystitis. -  Diabetes type 2 uncontrolled with complication -NPH 5 units BID -Resistant SSI -Transaminitis -  Delirium -Most likely secondary to sepsis: Resolved     DVT prophylaxis: Heparin drip Code Status: Full Family Communication: None Disposition Plan: ?     Consultants:  Cardiology pending Nephrology pending    Procedures/Significant Events:  11/28 Renal ultrasound:-S/P Right nephrectomy. -  Multiple left renal cysts, 11/28 Echocardiogram:- LVEF=40%. akinesis of the mid anteroseptal and apical septal myocardium. There is akinesis of the entireinferior and apical myocardium. There is akinesis of the apicalanterior myocardium.   - Pulmonary arteries: PA peak pressure: 41 mm Hg (S). 11/28 RUQ abdominal ultrasound: -Gallbladder concerning for acalculus cholecystitis. -Cystic area containing minimal septations in the anterior segment right lobe of the liver. Appears benign. -No biliary duct dilatation evident.    VENTILATOR SETTINGS:    Cultures 11/27 blood 2 pending 11/28 urine pending   Antimicrobials: Zosyn 11/27>> Vancomycin 11/27>>    Devices None   LINES / TUBES:      Continuous Infusions: . sodium chloride 100 mL/hr at 05/27/16 0558  .  piperacillin-tazobactam (ZOSYN)  IV    .  sodium bicarbonate 150 mEq in sterile water 1000 mL infusion 75 mL/hr at 05/27/16 0557     Objective: Vitals:    05/27/16 0530 05/27/16 0559 05/27/16 0600 05/27/16 0642  BP: 120/70  115/72 (!) 102/54  Pulse: 119  (!) 124 (!) 122  Resp: (!) 34  (!) 41 (!) 31  Temp:  100.3 F (37.9 C)    TempSrc:  Oral    SpO2: 98%  95% 95%  Weight:      Height:        Intake/Output Summary (Last 24 hours) at 05/27/16 0731 Last data filed at 05/27/16 [id number]  Gross per 24 hour  Intake             3009 ml  Output                0 ml  Net             3009 ml   Filed Weights   05/26/16 2310 05/26/16 2344  Weight: 95.3 kg (210 lb) 95.3 kg (210 lb)    Examination:  General: A/O 4, No acute respiratory distress (looks extremely uncomfortable) Eyes: negative scleral hemorrhage, negative anisocoria, negative icterus ENT: Negative Runny nose, negative gingival bleeding, Neck:  Negative scars, masses, torticollis, lymphadenopathy, JVD Lungs: tachypnea, Clear to auscultation bilaterally without wheezes or crackles Cardiovascular: Tachycardic, Regular rate and rhythm without murmur gallop or rub normal S1 and S2 Abdomen: negative abdominal pain, positive distention, positive soft, bowel sounds, no rebound, no ascites, no appreciable mass Extremities: No significant cyanosis, clubbing, or edema bilateral lower extremities Skin: Negative rashes, lesions, ulcers Psychiatric:  Negative depression, negative anxiety, negative fatigue, negative mania  Central nervous system:  Cranial nerves II through XII intact, tongue/uvula midline, all extremities muscle strength 5/5, sensation intact throughout,negative dysarthria, negative expressive aphasia, negative receptive aphasia.  .     Data Reviewed: Care during the described time interval was provided by me .  I have reviewed this patient's available data, including medical history, events of note, physical examination, and all test results as part of my evaluation. I have personally reviewed and interpreted all radiology studies.  CBC:  Recent Labs Lab 05/26/16 2305  05/27/16 0259  WBC 10.3 6.9  NEUTROABS 8.8*  --   HGB 11.2* 11.7*  HCT 32.6* 33.7*  MCV 89.1 88.5  PLT 95* 89*   Basic Metabolic Panel:  Recent Labs Lab 05/26/16 2305 05/27/16 0259  NA 126* 128*  K 4.9 5.2*  CL 98* 97*  CO2 16* 18*  GLUCOSE 347* 348*  BUN 50* 55*  CREATININE 4.78* 5.96*  CALCIUM 8.6* 8.8*   GFR: Estimated Creatinine Clearance: 12.1 mL/min (by C-G formula based on SCr of 5.96 mg/dL (H)). Liver Function Tests:  Recent Labs Lab 05/26/16 2305 05/27/16 0259  AST 310* 355*  ALT 101* 113*  ALKPHOS 110 118  BILITOT 1.2 1.6*  PROT 6.1* 6.6  ALBUMIN 2.8* 2.8*   No results for input(s): LIPASE, AMYLASE in the last 168 hours. No results for input(s): AMMONIA in the last 168 hours. Coagulation Profile: No results for input(s): INR, PROTIME in the last 168 hours. Cardiac Enzymes:  Recent Labs Lab 05/26/16 2305 05/27/16 0025 05/27/16 0259  CKTOTAL 25,775*  --  28,687*  TROPONINI 0.13* 0.15* 0.48*   BNP (last 3 results) No results for input(s): PROBNP in the last 8760 hours. HbA1C: No results for input(s): HGBA1C in the last 72 hours. CBG:  Recent Labs Lab 05/27/16 0327  GLUCAP 296*   Lipid Profile: No results for input(s): CHOL, HDL, LDLCALC, TRIG, CHOLHDL, LDLDIRECT in the last 72 hours. Thyroid Function Tests: No results for input(s): TSH, T4TOTAL, FREET4, T3FREE, THYROIDAB in the last 72 hours. Anemia Panel: No results for input(s): VITAMINB12, FOLATE, FERRITIN, TIBC, IRON, RETICCTPCT in the last 72 hours. Urine analysis:    Component Value Date/Time   COLORURINE AMBER (A) 05/27/2016 0244   APPEARANCEUR TURBID (A) 05/27/2016 0244   LABSPEC 1.021 05/27/2016 0244   PHURINE 6.5 05/27/2016 0244   GLUCOSEU 250 (A) 05/27/2016 0244   HGBUR LARGE (A) 05/27/2016 0244   BILIRUBINUR SMALL (A) 05/27/2016 0244   KETONESUR 15 (A) 05/27/2016 0244   PROTEINUR >300 (A) 05/27/2016 0244   NITRITE NEGATIVE 05/27/2016 0244   LEUKOCYTESUR LARGE (A)  05/27/2016 0244   Sepsis Labs: @LABRCNTIP (procalcitonin:4,lacticidven:4)  )No results found for this or any previous visit (from the past 240 hour(s)).       Radiology Studies: Dg Chest 2 View  Result Date: 05/26/2016 CLINICAL DATA:  Generalized weakness for several weeks.  Dyspnea. EXAM: CHEST  2 VIEW COMPARISON:  None. FINDINGS: AP semi upright and lateral views are provided. Lung volumes are slightly low with mild interstitial edema. No pneumonic consolidation, significant pleural fluid collections or pneumothoraces. The cardiac silhouette is marginally enlarged but this could be due to the AP projection. The aorta is not aneurysmal. No acute osseous abnormality. IMPRESSION: Mild interstitial prominence which may reflect interstitial edema. Mild cardiomegaly but this could be due to the AP projection. Electronically Signed   By: Tollie Eth M.D.   On: 05/26/2016 23:00   US Renal  Result Date: 05/27/2016 CLINICAL DATA:  Status post right nephrectomy.  Acute kidney injury. EXAM: RENAL / URINARY TRACT ULTRASOUND COMPLETE COMPARISON:  None. FINDINGS: Right Kidney: The right kidney is surgically absent. The right renal fossa was scanned and unremarkable. Left Kidney: Length: 13.2 cm. There are multiple left renal cysts, the largest of which measures 4.8 x 4.8 x 4.8 cm, located near the lower pole. Bladder: The bladder was nondistended. IMPRESSION: 1. Status post right nephrectomy.  Unremarkable right renal fossa. 2. Multiple left renal cysts, but otherwise normal sonographic appearance of the left kidney. Electronically Signed   By: Deatra Robinson M.D.   On: 05/27/2016 01:24        Scheduled Meds: . famotidine  20 mg Oral BID  . insulin aspart  0-9 Units Subcutaneous Q4H  . insulin NPH Human  5 Units Subcutaneous BID AC & HS   Continuous Infusions: . sodium chloride 100 mL/hr at 05/27/16 0558  . piperacillin-tazobactam (ZOSYN)  IV    .  sodium bicarbonate 150 mEq in sterile water 1000  mL infusion 75 mL/hr at 05/27/16 0557     LOS: 0 days    Time spent: 40 minutes    Oseph Imburgia, Roselind Messier, MD [facility] Pager [phone]   If 7PM-7AM, please contact night-coverage [email or web address] Password [facility] 05/27/2016, 7:31 AM

## 2016-05-27 NOTE — H&P (Addendum)
History and Physical    Jonathan BartersJames Waller NFA:[id number] DOB: 1941/05/11 DOA: 05/26/2016   PCP: No primary care provider on file. Chief Complaint:  Chief Complaint  Patient presents with  . Shortness of Breath  . Weakness    HPI: Jonathan BartersJames Avakian is a 75 y.o. male with medical history significant of IDDM, HTN, solitary kidney.  Patient is in town visiting family for thanksgiving.  Patient was in usual state of health until 2 days ago when he started to develop generalized weakness, SOB, productive cough, urinary urgency.  Today fell twice.  Was down for up to a couple of hours after first episode.  No LOC.  Fever 101 at home.  Has been having diarrhea today.  Intermittent confusion for past 2 days (at baseline lives at home alone).  Patient brought to ED.  ED Course: In ED patient is septic, has kidney failure with creat of 4.5 (presumably acute), mild transaminitis, CK elevated at 25k (patient has rhabdomyolysis).  Given empiric zosyn and vanc.  UA pending, CXR (see read) is basically radiologist hedging.  Trop os 0.13.  Lactate 3.34, BMP also shows NAG metabolic acidosis with bicarb of 16.  Review of Systems: As per HPI otherwise 10 point review of systems negative.    Past Medical History:  Diagnosis Date  . GERD (gastroesophageal reflux disease)   . Gout   . HLD (hyperlipidemia)   . HTN (hypertension)   . IDDM (insulin dependent diabetes mellitus) (HCC)     History reviewed. No pertinent surgical history.   reports that he has never smoked. He has never used smokeless tobacco. He reports that he does not drink alcohol. His drug history is not on file.  Allergies  Allergen Reactions  . Aspirin Nausea And Vomiting    History reviewed. No pertinent family history. No recent illness in family.   Prior to Admission medications   Medication Sig Start Date End Date Taking? Authorizing Provider  allopurinol (ZYLOPRIM) 300 MG tablet Take 300 mg by mouth daily.   Yes Historical  Provider, MD  amLODipine (NORVASC) 5 MG tablet Take 5 mg by mouth daily.   Yes Historical Provider, MD  calcium carbonate (TUMS - DOSED IN MG ELEMENTAL CALCIUM) 500 MG chewable tablet Chew 1-2 tablets by mouth 3 (three) times daily as needed for indigestion or heartburn.   Yes Historical Provider, MD  cetirizine (ZYRTEC) 10 MG tablet Take 10 mg by mouth daily.   Yes Historical Provider, MD  insulin aspart protamine- aspart (NOVOLOG MIX 70/30) (70-30) 100 UNIT/ML injection Inject 0-20 Units into the skin 3 (three) times daily.   Yes Historical Provider, MD  OVER THE COUNTER MEDICATION Take 2 capsules by mouth 2 (two) times daily. Ventricore   Yes Historical Provider, MD  ranitidine (ZANTAC) 150 MG tablet Take 150 mg by mouth 2 (two) times daily.   Yes Historical Provider, MD  sildenafil (REVATIO) 20 MG tablet Take 60-100 mg by mouth daily as needed (ED).   Yes Historical Provider, MD  simvastatin (ZOCOR) 20 MG tablet Take 20 mg by mouth daily.   Yes Historical Provider, MD  spironolactone (ALDACTONE) 25 MG tablet Take 12.5 mg by mouth at bedtime.   Yes Historical Provider, MD  tamsulosin (FLOMAX) 0.4 MG CAPS capsule Take 0.4 mg by mouth daily.   Yes Historical Provider, MD  valsartan (DIOVAN) 320 MG tablet Take 320 mg by mouth daily.   Yes Historical Provider, MD  vardenafil (LEVITRA) 20 MG tablet Take 20 mg by mouth daily  as needed for erectile dysfunction.   Yes Historical Provider, MD    Physical Exam: Vitals:   05/26/16 2330 05/26/16 2344 05/26/16 2345 05/27/16 0000  BP: 126/88  155/62 127/65  Pulse: 117     Resp: (!) 39  (!) 31 (!) 38  Temp:      TempSrc:      SpO2: 98%     Weight:  95.3 kg (210 lb)    Height:  6\' 1"  (1.854 m)        Constitutional: NAD, calm, comfortable Eyes: PERRL, lids and conjunctivae normal ENMT: Mucous membranes are moist. Posterior pharynx clear of any exudate or lesions.Normal dentition.  Neck: normal, supple, no masses, no thyromegaly Respiratory:  Tachypnea Cardiovascular: Regular rate and rhythm, no murmurs / rubs / gallops. No extremity edema. 2+ pedal pulses. No carotid bruits.  Abdomen: no tenderness, no masses palpated. No hepatosplenomegaly. Bowel sounds positive.  Musculoskeletal: no clubbing / cyanosis. No joint deformity upper and lower extremities. Good ROM, no contractures. Normal muscle tone.  Skin: no rashes, lesions, ulcers. No induration Neurologic: CN 2-12 grossly intact. Sensation intact, DTR normal. Strength 5/5 in all 4.  Psychiatric: Delirious   Labs on Admission: I have personally reviewed following labs and imaging studies  CBC:  Recent Labs Lab 05/26/16 2305  WBC 10.3  NEUTROABS 8.8*  HGB 11.2*  HCT 32.6*  MCV 89.1  PLT 95*   Basic Metabolic Panel:  Recent Labs Lab 05/26/16 2305  NA 126*  K 4.9  CL 98*  CO2 16*  GLUCOSE 347*  BUN 50*  CREATININE 4.78*  CALCIUM 8.6*   GFR: Estimated Creatinine Clearance: 15.1 mL/min (by C-G formula based on SCr of 4.78 mg/dL (H)). Liver Function Tests:  Recent Labs Lab 05/26/16 2305  AST 310*  ALT 101*  ALKPHOS 110  BILITOT 1.2  PROT 6.1*  ALBUMIN 2.8*   No results for input(s): LIPASE, AMYLASE in the last 168 hours. No results for input(s): AMMONIA in the last 168 hours. Coagulation Profile: No results for input(s): INR, PROTIME in the last 168 hours. Cardiac Enzymes:  Recent Labs Lab 05/26/16 2305  CKTOTAL 25,775*  TROPONINI 0.13*   BNP (last 3 results) No results for input(s): PROBNP in the last 8760 hours. HbA1C: No results for input(s): HGBA1C in the last 72 hours. CBG: No results for input(s): GLUCAP in the last 168 hours. Lipid Profile: No results for input(s): CHOL, HDL, LDLCALC, TRIG, CHOLHDL, LDLDIRECT in the last 72 hours. Thyroid Function Tests: No results for input(s): TSH, T4TOTAL, FREET4, T3FREE, THYROIDAB in the last 72 hours. Anemia Panel: No results for input(s): VITAMINB12, FOLATE, FERRITIN, TIBC, IRON,  RETICCTPCT in the last 72 hours. Urine analysis: No results found for: COLORURINE, APPEARANCEUR, LABSPEC, PHURINE, GLUCOSEU, HGBUR, BILIRUBINUR, KETONESUR, PROTEINUR, UROBILINOGEN, NITRITE, LEUKOCYTESUR Sepsis Labs: @LABRCNTIP (procalcitonin:4,lacticidven:4) )No results found for this or any previous visit (from the past 240 hour(s)).   Radiological Exams on Admission: Dg Chest 2 View  Result Date: 05/26/2016 CLINICAL DATA:  Generalized weakness for several weeks.  Dyspnea. EXAM: CHEST  2 VIEW COMPARISON:  None. FINDINGS: AP semi upright and lateral views are provided. Lung volumes are slightly low with mild interstitial edema. No pneumonic consolidation, significant pleural fluid collections or pneumothoraces. The cardiac silhouette is marginally enlarged but this could be due to the AP projection. The aorta is not aneurysmal. No acute osseous abnormality. IMPRESSION: Mild interstitial prominence which may reflect interstitial edema. Mild cardiomegaly but this could be due to the AP projection. Electronically  Signed   By: Tollie Eth M.D.   On: 05/26/2016 23:00    EKG: Independently reviewed.  Assessment/Plan Principal Problem:   Sepsis (HCC) Active Problems:   IDDM (insulin dependent diabetes mellitus) (HCC)   HTN (hypertension)   AKI (acute kidney injury) (HCC)   Delirium   Rhabdomyolysis   Transaminitis   Diarrhea    1. Sepsis - unclear primary, DDx includes PNA, diarrhea, UTI 1. Zosyn and vanc empirically 2. BCx pending and ordered 3. UA pending 4. IVF: 30 cc/kg bolus in ED, 150 cc/hr of normosol 5. Tele monitor 6. Serial lactates and trops due to elevation 2. AKI - 1. Renal US pending to r/o obstruction 2. Could also be due to rhabdo 3. Unknown baseline, but doubt that they would have him on ARB at baseline if CKD was this severe 4. Trend creatinine 5. Intake and ouput 3. Rhabdomyolysis - 1. IVF: 30 cc/kg bolus in ED, 150 cc/hr of normosol 2. Daily  CPK 4. Transaminitis - 1. unclear if just due to rhabdomyolysis 2. Repeat CMP in AM to trend 5. IDDM - 1. SSI Q4H sensitive scale 2. NPH 5 units BID ordered 6. Delirium - due to sepsis   DVT prophylaxis: Heparin [place] Code Status: Full Family Communication: Daughter at bedside Consults called: None Admission status: Admit to inpatient   Hillary Bow DO [facility] Pager [phone] from 7PM-7AM  If 7AM-7PM, please contact the day physician for the patient [email or web address] Password [facility]  05/27/2016, 12:54 AM

## 2016-05-27 NOTE — ED Notes (Signed)
After giving tylenol patient vomiting brown colored liquid

## 2016-05-27 NOTE — ED Notes (Signed)
Order diet tray 

## 2016-05-27 NOTE — ED Notes (Signed)
MD WOODS made aware of potassium 6.2.

## 2016-05-27 NOTE — Consult Note (Signed)
Reason for Consult:AKI, ? CKD Referring Physician: Dr. Erline Levine is an 75 y.o. male.  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  Here visiting from [facility].  Followed by Nephrologist and Urologist at home. Does not know renal function.   Has DM about 20 yr, denies HTN but has been on Valsartan.  Also on spironolactone and amlodipine.  Started feeling bad with N, V, D on Thurs.  Only laid around on Fri and Sat.  Severely weak, and dizzy, went to [facility] and dismissed as viral yest.  Had chills and fevers since Fri.  Could not get up and confused so brought to ED by EMS.  Found to have Cr of 4.78 to 5.96 to 6.6 to 6.84 to 6.97.this pm . Bicarb 16-17, Glu 300s.  CK [id number].   No NSAIDS.    Frequency, prob holding urine, thirsty. Thought to have UTI in ED.   Constitutional: weak, feels bad Eyes: negative Ears, nose, mouth, throat, and face: negative Respiratory: SOB Cardiovascular: negative Gastrointestinal: as above Genitourinary:as above Integument/breast: negative Musculoskeletal:hx gout Endocrine: DM Allergic/Immunologic: ASA   Past Medical History:  Diagnosis Date  . GERD (gastroesophageal reflux disease)   . Gout   . HLD (hyperlipidemia)   . HTN (hypertension)   . IDDM (insulin dependent diabetes mellitus) ([place])     History reviewed. No pertinent surgical history.  History reviewed. No pertinent family history.  Social History:  reports that he has never smoked. He has never used smokeless tobacco. He reports that he does not drink alcohol. His drug history is not on file.  Allergies:  Allergies  Allergen Reactions  . Aspirin Nausea And Vomiting    Medications:  I have reviewed the patient's current medications. Prior to Admission:  Prescriptions Prior to Admission  Medication Sig Dispense Refill Last Dose  . allopurinol (ZYLOPRIM) 300 MG tablet Take 300 mg by mouth daily.   05/25/2016 at Unknown time  . amLODipine (NORVASC) 5 MG tablet Take  5 mg by mouth daily.   05/25/2016 at Unknown time  . calcium carbonate (TUMS - DOSED IN MG ELEMENTAL CALCIUM) 500 MG chewable tablet Chew 1-2 tablets by mouth 3 (three) times daily as needed for indigestion or heartburn.   unk  . cetirizine (ZYRTEC) 10 MG tablet Take 10 mg by mouth daily.   05/25/2016 at Unknown time  . insulin aspart protamine- aspart (NOVOLOG MIX 70/30) (70-30) 100 UNIT/ML injection Inject 0-20 Units into the skin 3 (three) times daily.   05/26/2016 at Unknown time  . OVER THE COUNTER MEDICATION Take 2 capsules by mouth 2 (two) times daily. Ventricore   05/25/2016 at Unknown time  . ranitidine (ZANTAC) 150 MG tablet Take 150 mg by mouth 2 (two) times daily.   05/25/2016 at Unknown time  . sildenafil (REVATIO) 20 MG tablet Take 60-100 mg by mouth daily as needed (ED).   unk  . simvastatin (ZOCOR) 20 MG tablet Take 20 mg by mouth daily.   05/25/2016 at Unknown time  . spironolactone (ALDACTONE) 25 MG tablet Take 12.5 mg by mouth at bedtime.   05/25/2016 at Unknown time  . tamsulosin (FLOMAX) 0.4 MG CAPS capsule Take 0.4 mg by mouth daily.   05/25/2016 at Unknown time  . valsartan (DIOVAN) 320 MG tablet Take 320 mg by mouth daily.   05/25/2016 at Unknown time  . vardenafil (LEVITRA) 20 MG tablet Take 20 mg by mouth daily as needed for erectile  dysfunction.   unk     Results for orders placed or performed during the hospital encounter of 05/26/16 (from the past 48 hour(s))  Influenza panel by PCR (type A & B, H1N1)     Status: None   Collection Time: 05/26/16 10:13 PM  Result Value Ref Range   Influenza A By PCR NEGATIVE NEGATIVE   Influenza B By PCR NEGATIVE NEGATIVE    Comment: (NOTE) The Xpert Xpress Flu assay is intended as an aid in the diagnosis of  influenza and should not be used as a sole basis for treatment.  This  assay is FDA approved for nasopharyngeal swab specimens only. Nasal  washings and aspirates are unacceptable for Xpert Xpress Flu testing.    Comprehensive metabolic panel     Status: Abnormal   Collection Time: 05/26/16 11:05 PM  Result Value Ref Range   Sodium 126 (L) 135 - 145 mmol/L   Potassium 4.9 3.5 - 5.1 mmol/L   Chloride 98 (L) 101 - 111 mmol/L   CO2 16 (L) 22 - 32 mmol/L   Glucose, Bld 347 (H) 65 - 99 mg/dL   BUN 50 (H) 6 - 20 mg/dL   Creatinine, Ser 4.78 (H) 0.61 - 1.24 mg/dL   Calcium 8.6 (L) 8.9 - 10.3 mg/dL   Total Protein 6.1 (L) 6.5 - 8.1 g/dL   Albumin 2.8 (L) 3.5 - 5.0 g/dL   AST 310 (H) 15 - 41 U/L   ALT 101 (H) 17 - 63 U/L   Alkaline Phosphatase 110 38 - 126 U/L   Total Bilirubin 1.2 0.3 - 1.2 mg/dL   GFR calc non Af Amer 11 (L) >60 mL/min   GFR calc Af Amer 13 (L) >60 mL/min    Comment: (NOTE) The eGFR has been calculated using the CKD EPI equation. This calculation has not been validated in all clinical situations. eGFR's persistently <60 mL/min signify possible Chronic Kidney Disease.    Anion gap 12 5 - 15  Troponin I     Status: Abnormal   Collection Time: 05/26/16 11:05 PM  Result Value Ref Range   Troponin I 0.13 (HH) <0.03 ng/mL    Comment: CRITICAL RESULT CALLED TO, READ BACK BY AND VERIFIED WITH: FUNK B,RN 05/26/16 2355 WAYK   CBC with Differential     Status: Abnormal   Collection Time: 05/26/16 11:05 PM  Result Value Ref Range   WBC 10.3 4.0 - 10.5 K/uL   RBC 3.66 (L) 4.22 - 5.81 MIL/uL   Hemoglobin 11.2 (L) 13.0 - 17.0 g/dL   HCT 32.6 (L) 39.0 - 52.0 %   MCV 89.1 78.0 - 100.0 fL   MCH 30.6 26.0 - 34.0 pg   MCHC 34.4 30.0 - 36.0 g/dL   RDW 14.8 11.5 - 15.5 %   Platelets 95 (L) 150 - 400 K/uL    Comment: SPECIMEN CHECKED FOR CLOTS REPEATED TO VERIFY PLATELET COUNT CONFIRMED BY SMEAR LARGE PLATELETS PRESENT    Neutrophils Relative % 85 %   Neutro Abs 8.8 (H) 1.7 - 7.7 K/uL   Lymphocytes Relative 6 %   Lymphs Abs 0.6 (L) 0.7 - 4.0 K/uL   Monocytes Relative 9 %   Monocytes Absolute 0.9 0.1 - 1.0 K/uL   Eosinophils Relative 0 %   Eosinophils Absolute 0.0 0.0 - 0.7 K/uL    Basophils Relative 0 %   Basophils Absolute 0.0 0.0 - 0.1 K/uL  CK     Status: Abnormal   Collection Time: 05/26/16  11:05 PM  Result Value Ref Range   Total CK 25,775 (H) 49 - 397 U/L    Comment: RESULTS CONFIRMED BY MANUAL DILUTION  Brain natriuretic peptide     Status: Abnormal   Collection Time: 05/26/16 11:05 PM  Result Value Ref Range   B Natriuretic Peptide 190.1 (H) 0.0 - 100.0 pg/mL  I-Stat CG4 Lactic Acid, ED     Status: Abnormal   Collection Time: 05/26/16 11:13 PM  Result Value Ref Range   Lactic Acid, Venous 3.34 (HH) 0.5 - 1.9 mmol/L   Comment NOTIFIED PHYSICIAN   Blood Culture (routine x 2)     Status: None (Preliminary result)   Collection Time: 05/26/16 11:55 PM  Result Value Ref Range   Specimen Description BLOOD LEFT ANTECUBITAL    Special Requests BOTTLES DRAWN AEROBIC AND ANAEROBIC 5CC    Culture  Setup Time      GRAM NEGATIVE RODS IN BOTH AEROBIC AND ANAEROBIC BOTTLES Organism ID to follow CRITICAL RESULT CALLED TO, READ BACK BY AND VERIFIED WITH: N. Batchelder Pharm.D. 13:15 05/27/16  ([email or web address])    Culture GRAM NEGATIVE RODS    Report Status PENDING   Blood Culture ID Panel (Reflexed)     Status: Abnormal   Collection Time: 05/26/16 11:55 PM  Result Value Ref Range   Enterococcus species NOT DETECTED NOT DETECTED   Listeria monocytogenes NOT DETECTED NOT DETECTED   Staphylococcus species NOT DETECTED NOT DETECTED   Staphylococcus aureus NOT DETECTED NOT DETECTED   Streptococcus species NOT DETECTED NOT DETECTED   Streptococcus agalactiae NOT DETECTED NOT DETECTED   Streptococcus pneumoniae NOT DETECTED NOT DETECTED   Streptococcus pyogenes NOT DETECTED NOT DETECTED   Acinetobacter baumannii NOT DETECTED NOT DETECTED   Enterobacteriaceae species DETECTED (A) NOT DETECTED    Comment: CRITICAL RESULT CALLED TO, READ BACK BY AND VERIFIED WITH: N. Batchelder Pharm.D. 13:15 05/27/16 (wilsonm)    Enterobacter cloacae complex NOT DETECTED NOT DETECTED    Escherichia coli DETECTED (A) NOT DETECTED    Comment: CRITICAL RESULT CALLED TO, READ BACK BY AND VERIFIED WITH: N. Batchelder Pharm.D. 13:15 05/27/16 (wilsonm)    Klebsiella oxytoca NOT DETECTED NOT DETECTED   Klebsiella pneumoniae NOT DETECTED NOT DETECTED   Proteus species NOT DETECTED NOT DETECTED   Serratia marcescens NOT DETECTED NOT DETECTED   Carbapenem resistance NOT DETECTED NOT DETECTED   Haemophilus influenzae NOT DETECTED NOT DETECTED   Neisseria meningitidis NOT DETECTED NOT DETECTED   Pseudomonas aeruginosa NOT DETECTED NOT DETECTED   Candida albicans NOT DETECTED NOT DETECTED   Candida glabrata NOT DETECTED NOT DETECTED   Candida krusei NOT DETECTED NOT DETECTED   Candida parapsilosis NOT DETECTED NOT DETECTED   Candida tropicalis NOT DETECTED NOT DETECTED  Blood Culture (routine x 2)     Status: None (Preliminary result)   Collection Time: 05/27/16 12:20 AM  Result Value Ref Range   Specimen Description BLOOD RIGHT HAND    Special Requests IN PEDIATRIC BOTTLE 1CC    Culture  Setup Time      GRAM NEGATIVE RODS AEROBIC BOTTLE ONLY CRITICAL RESULT CALLED TO, READ BACK BY AND VERIFIED WITH: N. Batchelder Pharm.D. 13:15 05/27/16  ([email or web address])    Culture GRAM NEGATIVE RODS    Report Status PENDING   Troponin I (q 6hr x 3)     Status: Abnormal   Collection Time: 05/27/16 12:25 AM  Result Value Ref Range   Troponin I 0.15 (HH) <0.03 ng/mL    Comment: CRITICAL VALUE NOTED.  VALUE IS CONSISTENT WITH PREVIOUSLY REPORTED AND CALLED VALUE.  Urinalysis, Routine w reflex microscopic     Status: Abnormal   Collection Time: 05/27/16  2:44 AM  Result Value Ref Range   Color, Urine AMBER (A) YELLOW    Comment: BIOCHEMICALS MAY BE AFFECTED BY COLOR   APPearance TURBID (A) CLEAR   Specific Gravity, Urine 1.021 1.005 - 1.030   pH 6.5 5.0 - 8.0   Glucose, UA 250 (A) NEGATIVE mg/dL   Hgb urine dipstick LARGE (A) NEGATIVE   Bilirubin Urine SMALL (A) NEGATIVE   Ketones, ur 15 (A)  NEGATIVE mg/dL   Protein, ur >300 (A) NEGATIVE mg/dL   Nitrite NEGATIVE NEGATIVE   Leukocytes, UA LARGE (A) NEGATIVE  Urine microscopic-add on     Status: Abnormal   Collection Time: 05/27/16  2:44 AM  Result Value Ref Range   Squamous Epithelial / LPF 0-5 (A) NONE SEEN   WBC, UA TOO NUMEROUS TO COUNT 0 - 5 WBC/hpf   RBC / HPF 6-30 0 - 5 RBC/hpf   Bacteria, UA MANY (A) NONE SEEN   Urine-Other URINALYSIS PERFORMED ON SUPERNATANT     Comment: WBC CLUMPS  CBC     Status: Abnormal   Collection Time: 05/27/16  2:59 AM  Result Value Ref Range   WBC 6.9 4.0 - 10.5 K/uL   RBC 3.81 (L) 4.22 - 5.81 MIL/uL   Hemoglobin 11.7 (L) 13.0 - 17.0 g/dL   HCT 33.7 (L) 39.0 - 52.0 %   MCV 88.5 78.0 - 100.0 fL   MCH 30.7 26.0 - 34.0 pg   MCHC 34.7 30.0 - 36.0 g/dL   RDW 14.7 11.5 - 15.5 %   Platelets 89 (L) 150 - 400 K/uL    Comment: CONSISTENT WITH PREVIOUS RESULT  Comprehensive metabolic panel     Status: Abnormal   Collection Time: 05/27/16  2:59 AM  Result Value Ref Range   Sodium 128 (L) 135 - 145 mmol/L   Potassium 5.2 (H) 3.5 - 5.1 mmol/L    Comment: SPECIMEN HEMOLYZED. HEMOLYSIS MAY AFFECT INTEGRITY OF RESULTS.   Chloride 97 (L) 101 - 111 mmol/L   CO2 18 (L) 22 - 32 mmol/L   Glucose, Bld 348 (H) 65 - 99 mg/dL   BUN 55 (H) 6 - 20 mg/dL   Creatinine, Ser 5.96 (H) 0.61 - 1.24 mg/dL   Calcium 8.8 (L) 8.9 - 10.3 mg/dL   Total Protein 6.6 6.5 - 8.1 g/dL   Albumin 2.8 (L) 3.5 - 5.0 g/dL   AST 355 (H) 15 - 41 U/L    Comment: RESULTS CONFIRMED BY MANUAL DILUTION   ALT 113 (H) 17 - 63 U/L   Alkaline Phosphatase 118 38 - 126 U/L   Total Bilirubin 1.6 (H) 0.3 - 1.2 mg/dL   GFR calc non Af Amer 8 (L) >60 mL/min   GFR calc Af Amer 10 (L) >60 mL/min    Comment: (NOTE) The eGFR has been calculated using the CKD EPI equation. This calculation has not been validated in all clinical situations. eGFR's persistently <60 mL/min signify possible Chronic Kidney Disease.    Anion gap 13 5 - 15  CK      Status: Abnormal   Collection Time: 05/27/16  2:59 AM  Result Value Ref Range   Total CK 28,687 (H) 49 - 397 U/L    Comment: RESULTS CONFIRMED BY MANUAL DILUTION  Troponin I (q 6hr x 3)     Status: Abnormal  Collection Time: 05/27/16  2:59 AM  Result Value Ref Range   Troponin I 0.48 (HH) <0.03 ng/mL    Comment: CRITICAL VALUE NOTED.  VALUE IS CONSISTENT WITH PREVIOUSLY REPORTED AND CALLED VALUE.  CBG monitoring, ED     Status: Abnormal   Collection Time: 05/27/16  3:27 AM  Result Value Ref Range   Glucose-Capillary 296 (H) 65 - 99 mg/dL  I-Stat arterial blood gas, ED     Status: Abnormal   Collection Time: 05/27/16  4:30 AM  Result Value Ref Range   pH, Arterial 7.336 (L) 7.350 - 7.450   pCO2 arterial 29.7 (L) 32.0 - 48.0 mmHg   pO2, Arterial 98.0 83.0 - 108.0 mmHg   Bicarbonate 15.5 (L) 20.0 - 28.0 mmol/L   TCO2 16 0 - 100 mmol/L   O2 Saturation 96.0 %   Acid-base deficit 8.0 (H) 0.0 - 2.0 mmol/L   Patient temperature 102.9 F    Collection site RADIAL, ALLEN'S TEST ACCEPTABLE    Drawn by RT    Sample type ARTERIAL   Lactic acid, plasma     Status: Abnormal   Collection Time: 05/27/16  5:08 AM  Result Value Ref Range   Lactic Acid, Venous 2.1 (HH) 0.5 - 1.9 mmol/L    Comment: CRITICAL RESULT CALLED TO, READ BACK BY AND VERIFIED WITH: Margarita Sermons 05/27/16 0607 WAYK   Basic metabolic panel     Status: Abnormal   Collection Time: 05/27/16  8:53 AM  Result Value Ref Range   Sodium 131 (L) 135 - 145 mmol/L   Potassium 5.3 (H) 3.5 - 5.1 mmol/L   Chloride 101 101 - 111 mmol/L   CO2 16 (L) 22 - 32 mmol/L   Glucose, Bld 324 (H) 65 - 99 mg/dL   BUN 58 (H) 6 - 20 mg/dL   Creatinine, Ser 6.60 (H) 0.61 - 1.24 mg/dL   Calcium 8.6 (L) 8.9 - 10.3 mg/dL   GFR calc non Af Amer 7 (L) >60 mL/min   GFR calc Af Amer 8 (L) >60 mL/min    Comment: (NOTE) The eGFR has been calculated using the CKD EPI equation. This calculation has not been validated in all clinical situations. eGFR's  persistently <60 mL/min signify possible Chronic Kidney Disease.    Anion gap 14 5 - 15  Lactic acid, plasma     Status: Abnormal   Collection Time: 05/27/16  8:53 AM  Result Value Ref Range   Lactic Acid, Venous 2.2 (HH) 0.5 - 1.9 mmol/L    Comment: CRITICAL RESULT CALLED TO, READ BACK BY AND VERIFIED WITH: Unice Cobble [id number] WILDEK   Magnesium     Status: None   Collection Time: 05/27/16  8:53 AM  Result Value Ref Range   Magnesium 1.7 1.7 - 2.4 mg/dL  Lipase, blood     Status: None   Collection Time: 05/27/16  8:53 AM  Result Value Ref Range   Lipase 20 11 - 51 U/L  CBG monitoring, ED     Status: Abnormal   Collection Time: 05/27/16  9:55 AM  Result Value Ref Range   Glucose-Capillary 323 (H) 65 - 99 mg/dL  Troponin I (q 6hr x 3)     Status: Abnormal   Collection Time: 05/27/16 12:12 PM  Result Value Ref Range   Troponin I 0.18 (HH) <0.03 ng/mL    Comment: CRITICAL VALUE NOTED.  VALUE IS CONSISTENT WITH PREVIOUSLY REPORTED AND CALLED VALUE.  Basic metabolic panel  Status: Abnormal   Collection Time: 05/27/16 12:12 PM  Result Value Ref Range   Sodium 129 (L) 135 - 145 mmol/L   Potassium 6.2 (H) 3.5 - 5.1 mmol/L   Chloride 100 (L) 101 - 111 mmol/L   CO2 15 (L) 22 - 32 mmol/L   Glucose, Bld 344 (H) 65 - 99 mg/dL   BUN 63 (H) 6 - 20 mg/dL   Creatinine, Ser 6.84 (H) 0.61 - 1.24 mg/dL   Calcium 8.2 (L) 8.9 - 10.3 mg/dL   GFR calc non Af Amer 7 (L) >60 mL/min   GFR calc Af Amer 8 (L) >60 mL/min    Comment: (NOTE) The eGFR has been calculated using the CKD EPI equation. This calculation has not been validated in all clinical situations. eGFR's persistently <60 mL/min signify possible Chronic Kidney Disease.    Anion gap 14 5 - 15  CK     Status: Abnormal   Collection Time: 05/27/16 12:12 PM  Result Value Ref Range   Total CK 28,316 (H) 49 - 397 U/L    Comment: RESULTS CONFIRMED BY MANUAL DILUTION  Lipid panel     Status: Abnormal   Collection Time: 05/27/16  12:12 PM  Result Value Ref Range   Cholesterol 82 0 - 200 mg/dL   Triglycerides 306 (H) <150 mg/dL   HDL 14 (L) >40 mg/dL   Total CHOL/HDL Ratio 5.9 RATIO   VLDL 61 (H) 0 - 40 mg/dL   LDL Cholesterol 7 0 - 99 mg/dL    Comment:        Total Cholesterol/HDL:CHD Risk Coronary Heart Disease Risk Table                     Men   Women  1/2 Average Risk   3.4   3.3  Average Risk       5.0   4.4  2 X Average Risk   9.6   7.1  3 X Average Risk  23.4   11.0        Use the calculated Patient Ratio above and the CHD Risk Table to determine the patient's CHD Risk.        ATP III CLASSIFICATION (LDL):  <100     mg/dL   Optimal  100-129  mg/dL   Near or Above                    Optimal  130-159  mg/dL   Borderline  160-189  mg/dL   High  >190     mg/dL   Very High   CBG monitoring, ED     Status: Abnormal   Collection Time: 05/27/16 12:33 PM  Result Value Ref Range   Glucose-Capillary 312 (H) 65 - 99 mg/dL   Comment 1 Notify RN    Comment 2 Document in Chart   Basic metabolic panel     Status: Abnormal   Collection Time: 05/27/16  2:31 PM  Result Value Ref Range   Sodium 131 (L) 135 - 145 mmol/L   Potassium 5.5 (H) 3.5 - 5.1 mmol/L   Chloride 101 101 - 111 mmol/L   CO2 17 (L) 22 - 32 mmol/L   Glucose, Bld 341 (H) 65 - 99 mg/dL   BUN 63 (H) 6 - 20 mg/dL   Creatinine, Ser 6.97 (H) 0.61 - 1.24 mg/dL   Calcium 8.4 (L) 8.9 - 10.3 mg/dL   GFR calc non Af Amer 7 (L) >60 mL/min  GFR calc Af Amer 8 (L) >60 mL/min    Comment: (NOTE) The eGFR has been calculated using the CKD EPI equation. This calculation has not been validated in all clinical situations. eGFR's persistently <60 mL/min signify possible Chronic Kidney Disease.    Anion gap 13 5 - 15    Dg Chest 2 View  Result Date: 05/26/2016 CLINICAL DATA:  Generalized weakness for several weeks.  Dyspnea. EXAM: CHEST  2 VIEW COMPARISON:  None. FINDINGS: AP semi upright and lateral views are provided. Lung volumes are slightly low  with mild interstitial edema. No pneumonic consolidation, significant pleural fluid collections or pneumothoraces. The cardiac silhouette is marginally enlarged but this could be due to the AP projection. The aorta is not aneurysmal. No acute osseous abnormality. IMPRESSION: Mild interstitial prominence which may reflect interstitial edema. Mild cardiomegaly but this could be due to the AP projection. Electronically Signed   By: Ashley Royalty M.D.   On: 05/26/2016 23:00   US Renal  Result Date: 05/27/2016 CLINICAL DATA:  Status post right nephrectomy.  Acute kidney injury. EXAM: RENAL / URINARY TRACT ULTRASOUND COMPLETE COMPARISON:  None. FINDINGS: Right Kidney: The right kidney is surgically absent. The right renal fossa was scanned and unremarkable. Left Kidney: Length: 13.2 cm. There are multiple left renal cysts, the largest of which measures 4.8 x 4.8 x 4.8 cm, located near the lower pole. Bladder: The bladder was nondistended. IMPRESSION: 1. Status post right nephrectomy.  Unremarkable right renal fossa. 2. Multiple left renal cysts, but otherwise normal sonographic appearance of the left kidney. Electronically Signed   By: Ulyses Jarred M.D.   On: 05/27/2016 01:24   US Abdomen Limited Ruq  Result Date: 05/27/2016 CLINICAL DATA:  Fever with right abdominal pain EXAM: US ABDOMEN LIMITED - RIGHT UPPER QUADRANT COMPARISON:  None. FINDINGS: Gallbladder: No gallstones are evident. The gallbladder wall appears thickened and edematous. Minimal pericholecystic fluid noted. No sonographic Murphy sign noted by sonographer; note, however, the patient was given pain medication prior to this study which could mask focal tenderness to interrogation. Common bile duct: Diameter: 6 mm. No intrahepatic or extrahepatic biliary duct dilatation. Liver: In the anterior segment of the right lobe of the liver, there is a 2.8 x 2.2 x 2.8 cm cystic area containing minimal septations. No other focal liver lesion evident. Within  normal limits in parenchymal echogenicity. IMPRESSION: The appearance of the gallbladder is concerning for acalculus cholecystitis. Cystic area containing minimal septations in the anterior segment right lobe of the liver. This area appears benign. No biliary duct dilatation evident. Electronically Signed   By: Lowella Grip III M.D.   On: 05/27/2016 09:49    ROS Blood pressure 137/80, pulse (!) 103, temperature 98.2 F (36.8 C), temperature source Oral, resp. rate (!) 24, height _0  (1.88 m), weight 123.4 kg (272 lb 1.6 oz), SpO2 98 %. Physical Exam Physical Examination: General appearance - slowed mentation, dyspneic Mental status - as above Eyes - pupils equal and reactive, extraocular eye movements intact, funduscopic exam normal, discs flat and sharp Mouth - dry Neck - adenopathy noted PCL Lymphatics - posterior cervical nodes Chest - very poor bs, wheezes. Heart - tachy, gr2/6 sys M Abdomen - soft, pos bs, mild distension Neurological - nonfocal except MS Extremities - no pedal edema noted Skin - normal coloration and turgor, no rashes, no suspicious skin lesions noted  Assessment/Plan: 1 AKI most likely. Secondary to hemodynamic with low vol and Valsartan.  Appears to have rhabdo  also from immobility.  Severe acidemia and resp compromise, bp appears stable.  Need to use isotonic bicarb but high risk for entub. 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 Metabolic Bone Disease: check 6 Hx Nx  7 UTI ? Sepsis source 8 DM  9 Obesity P Tx to ICU, isotonic bicarb, follow K,  Urine chem, AB  Kaytlen Lightsey,Deadrick L 05/27/2016, 4:01 PM

## 2016-05-27 NOTE — Progress Notes (Signed)
eLink Physician-Brief Progress Note Patient Name: Jonathan Waller DOB: 1941-03-24 MRN: [id number]   Date of Service  05/27/2016  HPI/Events of Note  Pt already eval by PCCM in previous hour. AKI with sepsis and rhabdo.  Now arrived in ICU for dyspnea. Currently appears comfortable, resp rate 20-25, appears to be breathing comfortably. Conversational.   eICU Interventions  Will continue to monitor.         Shane Crutch 05/27/2016, 6:55 PM

## 2016-05-27 NOTE — Consult Note (Signed)
CARDIOLOGY CONSULT NOTE   Patient ID: Caulder Wehner MRN: [id number] DOB/AGE: [birth year] 75 y.o.  Admit date: 05/26/2016  Primary Physician   Pcp Not In System Primary Cardiologist   New Reason for Consultation  Low EF Requesting Physician  Dr. Joseph Art  HPI: Alecxander Mainwaring is a 75 y.o. male with No prior history of coronary artery disease who is from [place] (visiting friend here for Thanksgiving) presented for evaluation of 2 days history of generalized weakness, shortness of breath, fall and productive cough. He has a history of hypertension, hyperlipidemia, gout, GERD and diabetes. 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 He is  found to be septic with metabolic acidosis, rhabdomyolysis with CK of  25,775-->28687. Blood culture positive. He is on empiric antibiotic therapy. Since admission his kidney function and electrolyte is getting worse. Currently potassium of 6.2 with sodium of 129. DM creatinine of 6.84. GFR 8. Blood sugar running 300. BNP 190. LDL 7  triglyceride of 306. Ultrasound shows multiple left renal cyst.  EKG shows sinus tachycardia at rate of 115 bpm, RBBB, LAFB and nonspecific ST changes. No prior EKG to compare. Troponin of 0.13-->0.15-->0.48-->0.18. Echocardiogram showed left ventricular function of 40%, with multiple WM abnormality, aortic root dilation 38 mm. Increased right ventricular systolic pressure consistent with mild pulmonary hypertension. Cardiology asked for further management.   The patient states that he was in usual state of health up until 2 days ago. Patient denies any orthopnea, PND, syncope, lower extremity edema, chest pain, palpitation, melena or blood in his stool or urine. Hence history of alcohol abuse or illicit drug use. Former smoker quit more than 20 years ago. History of 15 pack year tobacco smoking.  Past Medical History:  Diagnosis Date  . GERD (gastroesophageal reflux disease)   . Gout   . HLD (hyperlipidemia)   . HTN  (hypertension)   . IDDM (insulin dependent diabetes mellitus) (HCC)      History reviewed. No pertinent surgical history.  Allergies  Allergen Reactions  . Aspirin Nausea And Vomiting    I have reviewed the patient's current medications . [START ON 05/28/2016] famotidine  20 mg Oral Daily  . heparin subcutaneous  5,000 Units Subcutaneous Q8H  . insulin aspart  0-20 Units Subcutaneous Q4H  . insulin glargine  14 Units Subcutaneous Q24H  . sodium polystyrene  45 g Oral Once   . sodium chloride 50 mL/hr (05/27/16 1059)  . calcium gluconate    . piperacillin-tazobactam (ZOSYN)  IV 3.375 g (05/27/16 1225)  .  sodium bicarbonate 150 mEq in sterile water 1000 mL infusion 75 mL/hr at 05/27/16 0557   acetaminophen **OR** acetaminophen, ondansetron (ZOFRAN) IV  Prior to Admission medications   Medication Sig Start Date End Date Taking? Authorizing Provider  allopurinol (ZYLOPRIM) 300 MG tablet Take 300 mg by mouth daily.   Yes Historical Provider, MD  amLODipine (NORVASC) 5 MG tablet Take 5 mg by mouth daily.   Yes Historical Provider, MD  calcium carbonate (TUMS - DOSED IN MG ELEMENTAL CALCIUM) 500 MG chewable tablet Chew 1-2 tablets by mouth 3 (three) times daily as needed for indigestion or heartburn.   Yes Historical Provider, MD  cetirizine (ZYRTEC) 10 MG tablet Take 10 mg by mouth daily.   Yes Historical Provider, MD  insulin aspart protamine- aspart (NOVOLOG MIX 70/30) (70-30) 100 UNIT/ML injection Inject 0-20 Units into the skin 3 (three) times daily.   Yes Historical Provider, MD  OVER THE COUNTER MEDICATION Take 2 capsules by  mouth 2 (two) times daily. Ventricore   Yes Historical Provider, MD  ranitidine (ZANTAC) 150 MG tablet Take 150 mg by mouth 2 (two) times daily.   Yes Historical Provider, MD  sildenafil (REVATIO) 20 MG tablet Take 60-100 mg by mouth daily as needed (ED).   Yes Historical Provider, MD  simvastatin (ZOCOR) 20 MG tablet Take 20 mg by mouth daily.   Yes  Historical Provider, MD  spironolactone (ALDACTONE) 25 MG tablet Take 12.5 mg by mouth at bedtime.   Yes Historical Provider, MD  tamsulosin (FLOMAX) 0.4 MG CAPS capsule Take 0.4 mg by mouth daily.   Yes Historical Provider, MD  valsartan (DIOVAN) 320 MG tablet Take 320 mg by mouth daily.   Yes Historical Provider, MD  vardenafil (LEVITRA) 20 MG tablet Take 20 mg by mouth daily as needed for erectile dysfunction.   Yes Historical Provider, MD     Social History   Social History  . Marital status: Legally Separated    Spouse name: N/A  . Number of children: N/A  . Years of education: N/A   Occupational History  . Not on file.   Social History Main Topics  . Smoking status: Never Smoker  . Smokeless tobacco: Never Used  . Alcohol use No  . Drug use: Unknown  . Sexual activity: Not on file   Other Topics Concern  . Not on file   Social History Narrative  . No narrative on file    No family status information on file.  Denies any family hx of CAD or stoke. Mother has HTN.   ROS:  Full 14 point review of systems complete and found to be negative unless listed above.  Physical Exam: Blood pressure 102/63, pulse 102, temperature 98 F (36.7 C), temperature source Oral, resp. rate (!) 28, height 6\' 1"  (1.854 m), weight 210 lb (95.3 kg), SpO2 96 %.  General: Well developed, well nourished, male in no acute distress Head: Eyes PERRLA, No xanthomas. Normocephalic and atraumatic, oropharynx without edema or exudate.  Lungs: Resp regular and unlabored, CTA. Heart: RRR no s3, s4, or murmurs..   Neck: No carotid bruits. No lymphadenopathy.  JVD. Abdomen: Bowel sounds present, abdomen soft and non-tender without masses or hernias noted. Msk:  No spine or cva tenderness. No weakness, no joint deformities or effusions. Extremities: No clubbing, cyanosis or edema. DP/PT/Radials 2+ and equal bilaterally. Neuro: Alert and oriented X 3. No focal deficits noted. Psych:  Good affect, responds  appropriately Skin: No rashes or lesions noted.  Labs:   Lab Results  Component Value Date   WBC 6.9 05/27/2016   HGB 11.7 (L) 05/27/2016   HCT 33.7 (L) 05/27/2016   MCV 88.5 05/27/2016   PLT 89 (L) 05/27/2016   No results for input(s): INR in the last 72 hours.  Recent Labs Lab 05/27/16 0259  05/27/16 1212  NA 128*  < > 129*  K 5.2*  < > 6.2*  CL 97*  < > 100*  CO2 18*  < > 15*  BUN 55*  < > 63*  CREATININE 5.96*  < > 6.84*  CALCIUM 8.8*  < > 8.2*  PROT 6.6  --   --   BILITOT 1.6*  --   --   ALKPHOS 118  --   --   ALT 113*  --   --   AST 355*  --   --   GLUCOSE 348*  < > 344*  ALBUMIN 2.8*  --   --   < > =  values in this interval not displayed. Magnesium  Date Value Ref Range Status  05/27/2016 1.7 1.7 - 2.4 mg/dL Final    Recent Labs  96/09/5409/27/17 2305 05/27/16 0025 05/27/16 0259 05/27/16 1212  CKTOTAL 25,775*  --  09,81128,687*  --   TROPONINI 0.13* 0.15* 0.48* 0.18*   No results for input(s): TROPIPOC in the last 72 hours. No results found for: PROBNP Lab Results  Component Value Date   CHOL 82 05/27/2016   HDL 14 (L) 05/27/2016   LDLCALC 7 05/27/2016   TRIG 306 (H) 05/27/2016   No results found for: DDIMER Lipase  Date/Time Value Ref Range Status  05/27/2016 08:53 AM 20 11 - 51 U/L Final   No results found for: TSH, T4TOTAL, T3FREE, THYROIDAB No results found for: VITAMINB12, FOLATE, FERRITIN, TIBC, IRON, RETICCTPCT  Echo: 05/27/16  ------------------------------------------------------------------- LV EF: 40%  ------------------------------------------------------------------- Indications:      Abnormal EKG 794.31.  ------------------------------------------------------------------- History:   Risk factors:  Sepsis. Hypertension. Diabetes mellitus.   ------------------------------------------------------------------- Study Conclusions  - Left ventricle: The cavity size was normal. The estimated   ejection fraction was 40%. There is  akinesis of the mid   anteroseptal and apical septal myocardium. There is akinesis of   the entireinferior and apical myocardium. There is akinesis of   the apicalanterior myocardium. The study is not technically   sufficient to allow evaluation of LV diastolic function. - Aortic valve: Trileaflet; mildly thickened, mildly calcified   leaflets. There was trivial regurgitation. - Aorta: Aortic root dimension: 38 mm (ED). - Aortic root: The aortic root was mildly dilated. - Pulmonary arteries: PA peak pressure: 41 mm Hg (S).  Impressions:  - The right ventricular systolic pressure was increased consistent   with mild pulmonary hypertension.  Radiology:  Dg Chest 2 View  Result Date: 05/26/2016 CLINICAL DATA:  Generalized weakness for several weeks.  Dyspnea. EXAM: CHEST  2 VIEW COMPARISON:  None. FINDINGS: AP semi upright and lateral views are provided. Lung volumes are slightly low with mild interstitial edema. No pneumonic consolidation, significant pleural fluid collections or pneumothoraces. The cardiac silhouette is marginally enlarged but this could be due to the AP projection. The aorta is not aneurysmal. No acute osseous abnormality. IMPRESSION: Mild interstitial prominence which may reflect interstitial edema. Mild cardiomegaly but this could be due to the AP projection. Electronically Signed   By: Tollie Ethavid  Kwon M.D.   On: 05/26/2016 23:00   [place] Renal  Result Date: 05/27/2016 CLINICAL DATA:  Status post right nephrectomy.  Acute kidney injury. EXAM: RENAL / URINARY TRACT ULTRASOUND COMPLETE COMPARISON:  None. FINDINGS: Right Kidney: The right kidney is surgically absent. The right renal fossa was scanned and unremarkable. Left Kidney: Length: 13.2 cm. There are multiple left renal cysts, the largest of which measures 4.8 x 4.8 x 4.8 cm, located near the lower pole. Bladder: The bladder was nondistended. IMPRESSION: 1. Status post right nephrectomy.  Unremarkable right renal fossa. 2.  Multiple left renal cysts, but otherwise normal sonographic appearance of the left kidney. Electronically Signed   By: Deatra RobinsonKevin  Herman M.D.   On: 05/27/2016 01:24   [place] Abdomen Limited Ruq  Result Date: 05/27/2016 CLINICAL DATA:  Fever with right abdominal pain EXAM: US ABDOMEN LIMITED - RIGHT UPPER QUADRANT COMPARISON:  None. FINDINGS: Gallbladder: No gallstones are evident. The gallbladder wall appears thickened and edematous. Minimal pericholecystic fluid noted. No sonographic Murphy sign noted by sonographer; note, however, the patient was given pain medication prior to this study which  could mask focal tenderness to interrogation. Common bile duct: Diameter: 6 mm. No intrahepatic or extrahepatic biliary duct dilatation. Liver: In the anterior segment of the right lobe of the liver, there is a 2.8 x 2.2 x 2.8 cm cystic area containing minimal septations. No other focal liver lesion evident. Within normal limits in parenchymal echogenicity. IMPRESSION: The appearance of the gallbladder is concerning for acalculus cholecystitis. Cystic area containing minimal septations in the anterior segment right lobe of the liver. This area appears benign. No biliary duct dilatation evident. Electronically Signed   By: Bretta Bang III M.D.   On: 05/27/2016 09:49    ASSESSMENT AND PLAN:     1. Cardiomyopathy - Echocardiogram as noted above. No prior history of CAD. Patient denies any orthopnea, PND or syncope. BNP minimally activated to 190. ? Takotsubo cardiomyopathy. Unable to add ACE/beta blocker due to soft BB. Start ASA 81mg  for fow.   2. Elevated troponin - Recent Labs  05/26/16 2305 05/27/16 0025 05/27/16 0259 05/27/16 1212  CKTOTAL 25,775*  --  46,659*  --   TROPONINI 0.13* 0.15* 0.48* 0.18*  - No chest pain. EKG with RBBB, LAFB and non specific ST changes. No chest pain. Currently he is not a candidate for any ischemic evaluation given rhabdomyolysis, transaminitis, AKI with electrolyte  abnormality, sepsis and bacteremia.  Otherwise per Primary Principal Problem:   Sepsis (HCC) Active Problems:   IDDM (insulin dependent diabetes mellitus) (HCC)   HTN (hypertension)   AKI (acute kidney injury) (HCC)   Delirium   Rhabdomyolysis   Transaminitis   Diarrhea   Signed: Bhagat,Bhavinkumar, PA 05/27/2016, 1:55 PM Pager [phone]  Co-Sign MD Patient seen and examined and history reviewed. Agree with above findings and plan. 75 yo WM from [place], [place] presents with with 4 day history of N/V, diarrhea, weakness, and fall. Larey Seat getting out of shower and couldn't get up. Denies any prior cardiac history. He does have a history of HTN, DM, and prior nephrectomy. On admit found to be septic with GNR bacteremia, ARF, rhabdomyolysis. Troponin mildly elevated to 0.48. Patient denies chest pain. States he is not SOB but appears dyspneic. On exam he is a WDBM and is dyspneic at rest.  No JVD. Lungs without rales or rhonchi CV distant HS without gallop or murmur. Abd soft no tenderness.  Labs, Xray, Abd and renal US, Ecg reivewed.  Echo reviewed  Patient has moderate LV dysfunction in setting of severe sepsis and GNR bacteremia. This may be related to sepsis syndrome. Possible CAD given regional WMA but patient without chest pain or ischemic Ecg changes. Not a candidate for any cardiac meds other than ASA given ARF and hypotension. Will need to reassess cardiac status once his multiple other medical problems stabilized. He has GNR sepsis- related to urinary source versus cholecystitis. He has severe Rhabdo and renal failure. He is acidotic. He has increased work of respiration.  Needs fluid resuscitation and broad spectrum antibiotics. If BP declines further will need IV pressor support.  Nephrology consult. Follow ABG closely.   Will follow   Chai Routh [place], MDFACC 05/27/2016 2:35 PM

## 2016-05-27 NOTE — Progress Notes (Signed)
  Echocardiogram 2D Echocardiogram has been performed.  Delcie Roch 05/27/2016, 8:45 AM

## 2016-05-28 ENCOUNTER — Inpatient Hospital Stay (HOSPITAL_COMMUNITY): Payer: Medicare Other

## 2016-05-28 LAB — RENAL FUNCTION PANEL
ALBUMIN: 2.4 g/dL — AB (ref 3.5–5.0)
ANION GAP: 14 (ref 5–15)
BUN: 74 mg/dL — AB (ref 6–20)
CO2: 24 mmol/L (ref 22–32)
Calcium: 8.5 mg/dL — ABNORMAL LOW (ref 8.9–10.3)
Chloride: 99 mmol/L — ABNORMAL LOW (ref 101–111)
Creatinine, Ser: 8.49 mg/dL — ABNORMAL HIGH (ref 0.61–1.24)
GFR calc Af Amer: 6 mL/min — ABNORMAL LOW (ref >60)
GFR calc non Af Amer: 5 mL/min — ABNORMAL LOW (ref >60)
GLUCOSE: 199 mg/dL — AB (ref 65–99)
PHOSPHORUS: 3.3 mg/dL (ref 2.5–4.6)
POTASSIUM: 4.6 mmol/L (ref 3.5–5.1)
SODIUM: 137 mmol/L (ref 135–145)

## 2016-05-28 LAB — HEMOGLOBIN A1C
HEMOGLOBIN A1C: 10 % — AB (ref 4.8–5.6)
MEAN PLASMA GLUCOSE: 240 mg/dL

## 2016-05-28 LAB — COMPREHENSIVE METABOLIC PANEL
ALBUMIN: 2.5 g/dL — AB (ref 3.5–5.0)
ALK PHOS: 102 U/L (ref 38–126)
ALT: 148 U/L — ABNORMAL HIGH (ref 17–63)
AST: 376 U/L — AB (ref 15–41)
Anion gap: 15 (ref 5–15)
BILIRUBIN TOTAL: 1.2 mg/dL (ref 0.3–1.2)
BUN: 75 mg/dL — AB (ref 6–20)
CALCIUM: 8.5 mg/dL — AB (ref 8.9–10.3)
CO2: 23 mmol/L (ref 22–32)
CREATININE: 8.65 mg/dL — AB (ref 0.61–1.24)
Chloride: 98 mmol/L — ABNORMAL LOW (ref 101–111)
GFR calc Af Amer: 6 mL/min — ABNORMAL LOW (ref >60)
GFR, EST NON AFRICAN AMERICAN: 5 mL/min — AB (ref >60)
GLUCOSE: 198 mg/dL — AB (ref 65–99)
Potassium: 4.6 mmol/L (ref 3.5–5.1)
Sodium: 136 mmol/L (ref 135–145)
TOTAL PROTEIN: 6.8 g/dL (ref 6.5–8.1)

## 2016-05-28 LAB — CBC WITH DIFFERENTIAL/PLATELET
BASOS PCT: 0 %
Basophils Absolute: 0 10*3/uL (ref 0.0–0.1)
EOS ABS: 0 10*3/uL (ref 0.0–0.7)
Eosinophils Relative: 0 %
HEMATOCRIT: 31.2 % — AB (ref 39.0–52.0)
HEMOGLOBIN: 11 g/dL — AB (ref 13.0–17.0)
LYMPHS ABS: 0.5 10*3/uL — AB (ref 0.7–4.0)
Lymphocytes Relative: 3 %
MCH: 30.7 pg (ref 26.0–34.0)
MCHC: 35.3 g/dL (ref 30.0–36.0)
MCV: 87.2 fL (ref 78.0–100.0)
MONO ABS: 1.8 10*3/uL — AB (ref 0.1–1.0)
MONOS PCT: 11 %
Neutro Abs: 13.6 10*3/uL — ABNORMAL HIGH (ref 1.7–7.7)
Neutrophils Relative %: 86 %
Platelets: 97 10*3/uL — ABNORMAL LOW (ref 150–400)
RBC: 3.58 MIL/uL — ABNORMAL LOW (ref 4.22–5.81)
RDW: 15.1 % (ref 11.5–15.5)
WBC: 15.8 10*3/uL — ABNORMAL HIGH (ref 4.0–10.5)

## 2016-05-28 LAB — CREATININE, URINE, RANDOM: CREATININE, URINE: 135.87 mg/dL

## 2016-05-28 LAB — GLUCOSE, CAPILLARY
GLUCOSE-CAPILLARY: 200 mg/dL — AB (ref 65–99)
GLUCOSE-CAPILLARY: 215 mg/dL — AB (ref 65–99)
GLUCOSE-CAPILLARY: 143 mg/dL — AB (ref 65–99)
GLUCOSE-CAPILLARY: 217 mg/dL — AB (ref 65–99)
Glucose-Capillary: 233 mg/dL — ABNORMAL HIGH (ref 65–99)
Glucose-Capillary: 256 mg/dL — ABNORMAL HIGH (ref 65–99)

## 2016-05-28 LAB — HEPATITIS B SURFACE ANTIBODY,QUALITATIVE: HEP B S AB: NONREACTIVE

## 2016-05-28 LAB — MAGNESIUM: Magnesium: 2 mg/dL (ref 1.7–2.4)

## 2016-05-28 LAB — CK: CK TOTAL: 19483 U/L — AB (ref 49–397)

## 2016-05-28 LAB — HEPATITIS C ANTIBODY (REFLEX)

## 2016-05-28 LAB — HEPATITIS B SURFACE ANTIGEN: HEP B S AG: NEGATIVE

## 2016-05-28 LAB — PHOSPHORUS: Phosphorus: 3.2 mg/dL (ref 2.5–4.6)

## 2016-05-28 LAB — SODIUM, URINE, RANDOM: Sodium, Ur: 80 mmol/L

## 2016-05-28 LAB — HCV COMMENT:

## 2016-05-28 LAB — PARATHYROID HORMONE, INTACT (NO CA): PTH: 194 pg/mL — AB (ref 15–65)

## 2016-05-28 MED ORDER — PENTAFLUOROPROP-TETRAFLUOROETH EX AERO: 1.0000 "application " | INHALATION_SPRAY | CUTANEOUS | Status: DC | PRN

## 2016-05-28 MED ORDER — SODIUM CHLORIDE 0.9 % IV SOLN: 100.0000 mL | INTRAVENOUS | Status: DC | PRN

## 2016-05-28 MED ORDER — ORAL CARE MOUTH RINSE
15.0000 mL | Freq: Two times a day (BID) | OROMUCOSAL | Status: DC
  Administered 2016-05-28 (×2): 15 mL via OROMUCOSAL

## 2016-05-28 MED ORDER — HEPARIN SODIUM (PORCINE) 1000 UNIT/ML IJ SOLN
3000.0000 [IU] | Freq: Once | INTRAMUSCULAR | Status: AC
Start: 2016-05-28 — End: 2016-05-28
  Administered 2016-05-28: 2400 [IU] via INTRAVENOUS
  Filled 2016-05-28: qty 3

## 2016-05-28 MED ORDER — LIDOCAINE HCL (PF) 1 % IJ SOLN: 5.0000 mL | INTRAMUSCULAR | Status: DC | PRN

## 2016-05-28 MED ORDER — PHENOL 1.4 % MT LIQD
1.0000 | OROMUCOSAL | Status: DC | PRN
  Administered 2016-05-29: 1 via OROMUCOSAL
  Filled 2016-05-28: qty 177

## 2016-05-28 MED ORDER — HEPARIN SODIUM (PORCINE) 1000 UNIT/ML DIALYSIS: 1000.0000 [IU] | INTRAMUSCULAR | Status: DC | PRN

## 2016-05-28 MED ORDER — PIPERACILLIN-TAZOBACTAM IN DEX 2-0.25 GM/50ML IV SOLN
2.2500 g | Freq: Four times a day (QID) | INTRAVENOUS | Status: DC
  Filled 2016-05-28 (×2): qty 50

## 2016-05-28 MED ORDER — PIPERACILLIN-TAZOBACTAM 3.375 G IVPB
3.3750 g | Freq: Two times a day (BID) | INTRAVENOUS | Status: DC
  Administered 2016-05-28 (×2): 3.375 g via INTRAVENOUS
  Filled 2016-05-28 (×4): qty 50

## 2016-05-28 MED ORDER — ALTEPLASE 2 MG IJ SOLR: 2.0000 mg | Freq: Once | INTRAMUSCULAR | Status: DC | PRN

## 2016-05-28 MED ORDER — HEPARIN SODIUM (PORCINE) 1000 UNIT/ML DIALYSIS: 40.0000 [IU]/kg | Freq: Once | INTRAMUSCULAR | Status: DC

## 2016-05-28 MED ORDER — FENTANYL CITRATE (PF) 100 MCG/2ML IJ SOLN
25.0000 ug | Freq: Once | INTRAMUSCULAR | Status: AC
  Administered 2016-05-28: 25 ug via INTRAVENOUS

## 2016-05-28 MED ORDER — FENTANYL CITRATE (PF) 100 MCG/2ML IJ SOLN
INTRAMUSCULAR | Status: AC
  Administered 2016-05-28: 25 ug via INTRAVENOUS
  Filled 2016-05-28: qty 2

## 2016-05-28 MED ORDER — LIDOCAINE-PRILOCAINE 2.5-2.5 % EX CREA: 1.0000 "application " | TOPICAL_CREAM | CUTANEOUS | Status: DC | PRN

## 2016-05-28 NOTE — Procedures (Signed)
I was present at this session.  I have reviewed the session itself and made appropriate changes. Awaiting sink connector.  R Ij Cath  Jonathan Waller,Jonathan Waller 11/29/20171:35 PM

## 2016-05-28 NOTE — Progress Notes (Signed)
PULMONARY / CRITICAL CARE MEDICINE   Name: Jonathan Waller MRN: [id number] DOB: 01/14/1941    ADMISSION DATE:  05/26/2016 CONSULTATION DATE:  05/28/2016  REFERRING MD:  Joseph Art, triad  CHIEF COMPLAINT:  Respiratory distress, renal failure  HISTORY OF PRESENT ILLNESS:   75 year old man who is visiting his daughter from [place], history of right nephrectomy, admitted 11/27 with generalized weakness and fevers, labs showed rhabdomyolysis with CK of 20 5K, creatinine 4.7, lactate of 3.3, WBC 10 k, blood cultures subsequently growing Escherichia coli. He was treated with broad-spectrum antibiotics, admitted to stepdown, creatinine has now increased to 6.9, CK has increased to 28K, renal and cardiology consulted PCCM is consulted due to concern for respiratory failure especially with fluid and bicarbonate infusions Ultrasound abdomen is suggestive acalculous cholecystitis and no hydronephrosis was noted   SUBJECTIVE:  Afebrile Increased resp distress this am nausea  VITAL SIGNS: BP (!) 144/81   Pulse (!) 105   Temp 98.5 F (36.9 C) (Oral)   Resp (!) 27   Ht 6\' 2"  (1.88 m)   Wt 271 lb 9.7 oz (123.2 kg)   SpO2 95%   BMI 34.87 kg/m   HEMODYNAMICS:    VENTILATOR SETTINGS:    INTAKE / OUTPUT: I/O last 3 completed shifts: In: 4862.8 [I.V.:4812.8; IV Piggyback:50] Out: 200 [Urine:200]  PHYSICAL EXAMINATION: Obese man sitting in bed in mild respiratory distress, able to speak in full sentences No pallor, icterus Decreased breath sounds bilateral, no rhonchi S1 and S2 normal, no S3 or rub Soft, nondistended, nontender abdomen, no guarding Grossly nonfocal, alert and interactive no asterixis No pedal edema  LABS:  BMET  Recent Labs Lab 05/27/16 1852 05/27/16 2229 05/28/16 0634  NA 132* 134* 136  137  K 5.2* 4.9  4.9 4.6  4.6  CL 100* 100* 98*  99*  CO2 19* 21* 23  24  BUN 68* 70* 75*  74*  CREATININE 7.49* 7.53* 8.65*  8.49*  GLUCOSE 315* 249* 198*  199*     Electrolytes  Recent Labs Lab 05/27/16 0853  05/27/16 1852 05/27/16 2229 05/28/16 0634  CALCIUM 8.6*  < > 8.4* 8.4* 8.5*  8.5*  MG 1.7  --   --   --  2.0  PHOS  --   --  3.5 3.1 3.2  3.3  < > = values in this interval not displayed.  CBC  Recent Labs Lab 05/26/16 2305 05/27/16 0259 05/28/16 0634  WBC 10.3 6.9 15.8*  HGB 11.2* 11.7* 11.0*  HCT 32.6* 33.7* 31.2*  PLT 95* 89* 97*    Coag's No results for input(s): APTT, INR in the last 168 hours.  Sepsis Markers  Recent Labs Lab 05/26/16 2313 05/27/16 0508 05/27/16 0853  LATICACIDVEN 3.34* 2.1* 2.2*    ABG  Recent Labs Lab 05/27/16 0430  PHART 7.336*  PCO2ART 29.7*  PO2ART 98.0    Liver Enzymes  Recent Labs Lab 05/26/16 2305 05/27/16 0259 05/27/16 1852 05/27/16 2229 05/28/16 0634  AST 310* 355*  --   --  376*  ALT 101* 113*  --   --  148*  ALKPHOS 110 118  --   --  102  BILITOT 1.2 1.6*  --   --  1.2  ALBUMIN 2.8* 2.8* 2.6* 2.4* 2.5*  2.4*    Cardiac Enzymes  Recent Labs Lab 05/27/16 0259 05/27/16 1212 05/27/16 1852  TROPONINI 0.48* 0.18* 0.12*    Glucose  Recent Labs Lab 05/27/16 0955 05/27/16 1233 05/27/16 1623 05/27/16 2015 05/28/16 0029  05/28/16 0411  GLUCAP 323* 312* 335* 278* 256* 200*    Imaging Dg Chest Port 1 View  Result Date: 05/27/2016 CLINICAL DATA:  Shortness of breath EXAM: PORTABLE CHEST 1 VIEW COMPARISON:  05/26/2016 FINDINGS: Normal heart size with mild central vascular congestion. No focal pneumonia, collapse or consolidation. Negative for edema, effusion or pneumothorax. Trachea is midline. Atherosclerosis of the aorta. No acute osseous finding. IMPRESSION: Mild central vascular congestion without CHF or focal pneumonia. Electronically Signed   By: Judie PetitM.  Shick M.D.   On: 05/27/2016 17:09   [place] Abdomen Limited Ruq  Result Date: 05/27/2016 CLINICAL DATA:  Fever with right abdominal pain EXAM: US ABDOMEN LIMITED - RIGHT UPPER QUADRANT COMPARISON:   None. FINDINGS: Gallbladder: No gallstones are evident. The gallbladder wall appears thickened and edematous. Minimal pericholecystic fluid noted. No sonographic Murphy sign noted by sonographer; note, however, the patient was given pain medication prior to this study which could mask focal tenderness to interrogation. Common bile duct: Diameter: 6 mm. No intrahepatic or extrahepatic biliary duct dilatation. Liver: In the anterior segment of the right lobe of the liver, there is a 2.8 x 2.2 x 2.8 cm cystic area containing minimal septations. No other focal liver lesion evident. Within normal limits in parenchymal echogenicity. IMPRESSION: The appearance of the gallbladder is concerning for acalculus cholecystitis. Cystic area containing minimal septations in the anterior segment right lobe of the liver. This area appears benign. No biliary duct dilatation evident. Electronically Signed   By: Bretta BangWilliam  Woodruff III M.D.   On: 05/27/2016 09:49     STUDIES:  11/28 US abd >> gallbladder wall thickening and edema c/w a calculus cholecystitis, liver cyst 11/28 [place] renal's >> right nephrectomy, left renal cysts, no hydronephrosis Echo - EF 40%, WMA =  CULTURES: 11/27 blood >> e coli 11/28 urine >> e coli  ANTIBIOTICS: 11/27 zosyn >>  SIGNIFICANT EVENTS:   LINES/TUBES:   DISCUSSION: Escherichia coli sepsis causing AKI and rhabdomyolysis, with concern for fluid overload   ASSESSMENT / PLAN:  PULMONARY A: Acute respiratory distress P:   bipap prn - should improve with HD  CARDIOVASCULAR A:  cardiomyopathy ? Of sepsis P:  Reassess once acute issues resolved  RENAL A:  Single kidney AKI - due to sepsis, rhabdomyolysis with aldactone/ ARB on board P:   Bicarb gtt -lower rate per renal Place HD cath Follow CK once/daily  GASTROINTESTINAL A:   Acute acalculous cholecystitis P:   Follow LFts If worsen, may need chole drain  HEMATOLOGIC A:   thrombocytopenia P:   follow  INFECTIOUS A:   E coli sepsis P:   Zosyn ct until sens back  ENDOCRINE A:   Dm-2, uncontrolled   P:   SSI- resistant   Summary - Will need dialysis but hopeful for renal recovery eventually   FAMILY  - Updates: daughter at bedside    Cyril Mourningakesh Allisson Schindel MD. Tonny BollmanFCCP. [facility] Pager [phone] If no response call [phone]    05/28/2016, 8:42 AM

## 2016-05-28 NOTE — Procedures (Signed)
Hemodialysis Catheter Insertion Procedure Note Alfonso Arroyos [id number] [birth year]  Procedure: Insertion of Hemodialysis Catheter Indications: Hemodialysis  Procedure Details Consent: Risks of procedure as well as the alternatives and risks of each were explained to the (patient/caregiver).  Consent for procedure obtained.  Time Out: Verified patient identification, verified procedure, site/side was marked, verified correct patient position, special equipment/implants available, medications/allergies/relevent history reviewed, required imaging and test results available.  Performed  Maximum sterile technique was used including antiseptics, cap, gloves, gown, hand hygiene, mask and sheet.  Skin prep: Chlorhexidine; local anesthetic administered  A Trialysis HD catheter was placed in the right internal jugular vein using the Seldinger technique.  Evaluation Blood flow good Complications: No apparent complications Patient did tolerate procedure well. Chest X-ray ordered to verify placement.  CXR: pending.   Procedure performed under direct supervision of Dr. Vassie Loll and with ultrasound guidance for real time vessel cannulation.     Jovita Kussmaul, AG-ACNP  Pulmonary & Critical Care  Pgr: [phone]  PCCM Pgr: [phone]

## 2016-05-28 NOTE — Progress Notes (Signed)
Subjective: Interval History: has complaints N, V, feels miserable, SOB.  Objective: Vital signs in last 24 hours: Temp:  [98 F (36.7 C)-98.5 F (36.9 C)] 98.5 F (36.9 C) (11/29 0400) Pulse Rate:  [81-115] 105 (11/29 0800) Resp:  [20-33] 27 (11/29 0800) BP: (101-144)/(57-104) 144/81 (11/29 0800) SpO2:  [93 %-99 %] 95 % (11/29 0800) Weight:  [123.2 kg (271 lb 9.7 oz)-123.4 kg (272 lb 1.6 oz)] 123.2 kg (271 lb 9.7 oz) (11/29 0413) Weight change: 28.2 kg (62 lb 1.6 oz)  Intake/Output from previous day: 11/28 0701 - 11/29 0700 In: 1853.8 [I.V.:1803.8; IV Piggyback:50] Out: 200 [Urine:200] Intake/Output this shift: No intake/output data recorded.  General appearance: moderate distress, pale, slowed mentation and obese, tachypneic Resp: diminished breath sounds bilaterally and rales bibasilar Cardio: S1, S2 normal and systolic murmur: holosystolic 2/6, blowing at apex GI: obese, pos bs,  Extremities: edema 2+  Lab Results:  Recent Labs  05/27/16 0259 05/28/16 0634  WBC 6.9 15.8*  HGB 11.7* 11.0*  HCT 33.7* 31.2*  PLT 89* 97*   BMET:  Recent Labs  05/27/16 2229 05/28/16 0634  NA 134* 136  137  K 4.9  4.9 4.6  4.6  CL 100* 98*  99*  CO2 21* 23  24  GLUCOSE 249* 198*  199*  BUN 70* 75*  74*  CREATININE 7.53* 8.65*  8.49*  CALCIUM 8.4* 8.5*  8.5*   No results for input(s): PTH in the last 72 hours. Iron Studies: No results for input(s): IRON, TIBC, TRANSFERRIN, FERRITIN in the last 72 hours.  Studies/Results: Dg Chest 2 View  Result Date: 05/26/2016 CLINICAL DATA:  Generalized weakness for several weeks.  Dyspnea. EXAM: CHEST  2 VIEW COMPARISON:  None. FINDINGS: AP semi upright and lateral views are provided. Lung volumes are slightly low with mild interstitial edema. No pneumonic consolidation, significant pleural fluid collections or pneumothoraces. The cardiac silhouette is marginally enlarged but this could be due to the AP projection. The aorta is  not aneurysmal. No acute osseous abnormality. IMPRESSION: Mild interstitial prominence which may reflect interstitial edema. Mild cardiomegaly but this could be due to the AP projection. Electronically Signed   By: Tollie Ethavid  Kwon M.D.   On: 05/26/2016 23:00   [place] Renal  Result Date: 05/27/2016 CLINICAL DATA:  Status post right nephrectomy.  Acute kidney injury. EXAM: RENAL / URINARY TRACT ULTRASOUND COMPLETE COMPARISON:  None. FINDINGS: Right Kidney: The right kidney is surgically absent. The right renal fossa was scanned and unremarkable. Left Kidney: Length: 13.2 cm. There are multiple left renal cysts, the largest of which measures 4.8 x 4.8 x 4.8 cm, located near the lower pole. Bladder: The bladder was nondistended. IMPRESSION: 1. Status post right nephrectomy.  Unremarkable right renal fossa. 2. Multiple left renal cysts, but otherwise normal sonographic appearance of the left kidney. Electronically Signed   By: Deatra RobinsonKevin  Herman M.D.   On: 05/27/2016 01:24   Dg Chest Port 1 View  Result Date: 05/27/2016 CLINICAL DATA:  Shortness of breath EXAM: PORTABLE CHEST 1 VIEW COMPARISON:  05/26/2016 FINDINGS: Normal heart size with mild central vascular congestion. No focal pneumonia, collapse or consolidation. Negative for edema, effusion or pneumothorax. Trachea is midline. Atherosclerosis of the aorta. No acute osseous finding. IMPRESSION: Mild central vascular congestion without CHF or focal pneumonia. Electronically Signed   By: Judie PetitM.  Shick M.D.   On: 05/27/2016 17:09   [place] Abdomen Limited Ruq  Result Date: 05/27/2016 CLINICAL DATA:  Fever with right abdominal pain EXAM: [place]  ABDOMEN LIMITED - RIGHT UPPER QUADRANT COMPARISON:  None. FINDINGS: Gallbladder: No gallstones are evident. The gallbladder wall appears thickened and edematous. Minimal pericholecystic fluid noted. No sonographic Murphy sign noted by sonographer; note, however, the patient was given pain medication prior to this study which could mask  focal tenderness to interrogation. Common bile duct: Diameter: 6 mm. No intrahepatic or extrahepatic biliary duct dilatation. Liver: In the anterior segment of the right lobe of the liver, there is a 2.8 x 2.2 x 2.8 cm cystic area containing minimal septations. No other focal liver lesion evident. Within normal limits in parenchymal echogenicity. IMPRESSION: The appearance of the gallbladder is concerning for acalculus cholecystitis. Cystic area containing minimal septations in the anterior segment right lobe of the liver. This area appears benign. No biliary duct dilatation evident. Electronically Signed   By: Bretta Bang III M.D.   On: 05/27/2016 09:49    I have reviewed the patient's current medications.  Assessment/Plan: 1 AKI unknown baseline. Vol xs, uremic.  Needs acute HD 2 Urosepis 3 Rhabdo 4 1 kidney 5 DM controlled P temp cath, HD, AB, close monitoring    LOS: 1 day   Abbagail Scaff,Moiz L 05/28/2016,8:28 AM

## 2016-05-28 NOTE — Progress Notes (Signed)
Patient Name: Jonathan Waller Date of Encounter: 05/28/2016  Primary Cardiologist: Dantre Yearwood [place] MD- new  Hospital Problem List     Principal Problem:   Sepsis [facility]) Active Problems:   IDDM (insulin dependent diabetes mellitus) (HCC)   HTN (hypertension)   AKI (acute kidney injury) (HCC)   Delirium   Rhabdomyolysis   Transaminitis   Diarrhea   Cholangitis   Cardiomyopathy (HCC)   Demand ischemia (HCC)     Subjective   Feels horrible. Denies SOB or chest pain. Weak. Complains of abdominal pain/swelling.  Inpatient Medications    Scheduled Meds: . famotidine  20 mg Oral Daily  . heparin subcutaneous  5,000 Units Subcutaneous Q8H  . insulin aspart  0-20 Units Subcutaneous Q4H  . insulin glargine  14 Units Subcutaneous Q24H  . piperacillin-tazobactam (ZOSYN)  IV  3.375 g Intravenous Q12H   Continuous Infusions: .  sodium bicarbonate 150 mEq in sterile water 1000 mL infusion 75 mL/hr at 05/28/16 0215   PRN Meds: acetaminophen **OR** acetaminophen, ondansetron (ZOFRAN) IV   Vital Signs    Vitals:   05/28/16 0500 05/28/16 0600 05/28/16 0700 05/28/16 0800  BP: 126/66 (!) 116/104 (!) 135/102 (!) 144/81  Pulse: (!) 103 (!) 103 (!) 104 (!) 105  Resp: (!) 30 (!) 25 (!) 28 (!) 27  Temp:      TempSrc:      SpO2: 95% 95% 93% 95%  Weight:      Height:        Intake/Output Summary (Last 24 hours) at 05/28/16 0855 Last data filed at 05/28/16 0600  Gross per 24 hour  Intake          1853.75 ml  Output              200 ml  Net          1653.75 ml   Filed Weights   05/26/16 2344 05/27/16 1423 05/28/16 0413  Weight: 210 lb (95.3 kg) 272 lb 1.6 oz (123.4 kg) 271 lb 9.7 oz (123.2 kg)    Physical Exam    GEN: Well nourished, well developed, in mild respiratory distress.  HEENT: Grossly normal.  Neck: Supple, no JVD, carotid bruits, or masses. Cardiac: RRR, no murmurs, rubs, or gallops. No clubbing, cyanosis, edema.  Radials/DP/PT 2+ and equal bilaterally.    Respiratory:  Respiratory rate increased, clear to auscultation bilaterally. GI: Soft, nontender, nondistended, BS + x 4. MS: no deformity or atrophy. Skin: warm and dry, no rash. Neuro:  Strength and sensation are intact. Psych: AAOx3.  Normal affect.  Labs    CBC  Recent Labs  05/26/16 2305 05/27/16 0259 05/28/16 0634  WBC 10.3 6.9 15.8*  NEUTROABS 8.8*  --  13.6*  HGB 11.2* 11.7* 11.0*  HCT 32.6* 33.7* 31.2*  MCV 89.1 88.5 87.2  PLT 95* 89* 97*   Basic Metabolic Panel  Recent Labs  05/27/16 0853  05/27/16 2229 05/28/16 0634  NA 131*  < > 134* 136  137  K 5.3*  < > 4.9  4.9 4.6  4.6  CL 101  < > 100* 98*  99*  CO2 16*  < > 21* 23  24  GLUCOSE 324*  < > 249* 198*  199*  BUN 58*  < > 70* 75*  74*  CREATININE 6.60*  < > 7.53* 8.65*  8.49*  CALCIUM 8.6*  < > 8.4* 8.5*  8.5*  MG 1.7  --   --  2.0  PHOS  --   < >  3.1 3.2  3.3  < > = values in this interval not displayed. Liver Function Tests  Recent Labs  05/27/16 0259  05/27/16 2229 05/28/16 0634  AST 355*  --   --  376*  ALT 113*  --   --  148*  ALKPHOS 118  --   --  102  BILITOT 1.6*  --   --  1.2  PROT 6.6  --   --  6.8  ALBUMIN 2.8*  < > 2.4* 2.5*  2.4*  < > = values in this interval not displayed.  Recent Labs  05/27/16 0853  LIPASE 20   Cardiac Enzymes  Recent Labs  05/27/16 0259 05/27/16 1212 05/27/16 1852 05/27/16 2229  CKTOTAL 28,687* 28,316* 25,261* 22,886*  TROPONINI 0.48* 0.18* 0.12*  --    BNP Invalid input(s): POCBNP D-Dimer No results for input(s): DDIMER in the last 72 hours. Hemoglobin A1C  Recent Labs  05/27/16 1212  HGBA1C 10.0*   Fasting Lipid Panel  Recent Labs  05/27/16 1212  CHOL 82  HDL 14*  LDLCALC 7  TRIG 306*  CHOLHDL 5.9   Thyroid Function Tests No results for input(s): TSH, T4TOTAL, T3FREE, THYROIDAB in the last 72 hours.  Invalid input(s): FREET3  Telemetry    Sinus tachy - Personally Reviewed  ECG    Sinus tachy, RBBB,  LAFB - Personally Reviewed  Radiology    Dg Chest 2 View  Result Date: 05/26/2016 CLINICAL DATA:  Generalized weakness for several weeks.  Dyspnea. EXAM: CHEST  2 VIEW COMPARISON:  None. FINDINGS: AP semi upright and lateral views are provided. Lung volumes are slightly low with mild interstitial edema. No pneumonic consolidation, significant pleural fluid collections or pneumothoraces. The cardiac silhouette is marginally enlarged but this could be due to the AP projection. The aorta is not aneurysmal. No acute osseous abnormality. IMPRESSION: Mild interstitial prominence which may reflect interstitial edema. Mild cardiomegaly but this could be due to the AP projection. Electronically Signed   By: Tollie Eth M.D.   On: 05/26/2016 23:00   US Renal  Result Date: 05/27/2016 CLINICAL DATA:  Status post right nephrectomy.  Acute kidney injury. EXAM: RENAL / URINARY TRACT ULTRASOUND COMPLETE COMPARISON:  None. FINDINGS: Right Kidney: The right kidney is surgically absent. The right renal fossa was scanned and unremarkable. Left Kidney: Length: 13.2 cm. There are multiple left renal cysts, the largest of which measures 4.8 x 4.8 x 4.8 cm, located near the lower pole. Bladder: The bladder was nondistended. IMPRESSION: 1. Status post right nephrectomy.  Unremarkable right renal fossa. 2. Multiple left renal cysts, but otherwise normal sonographic appearance of the left kidney. Electronically Signed   By: Deatra Robinson M.D.   On: 05/27/2016 01:24   Dg Chest Port 1 View  Result Date: 05/27/2016 CLINICAL DATA:  Shortness of breath EXAM: PORTABLE CHEST 1 VIEW COMPARISON:  05/26/2016 FINDINGS: Normal heart size with mild central vascular congestion. No focal pneumonia, collapse or consolidation. Negative for edema, effusion or pneumothorax. Trachea is midline. Atherosclerosis of the aorta. No acute osseous finding. IMPRESSION: Mild central vascular congestion without CHF or focal pneumonia. Electronically  Signed   By: Judie Petit.  Shick M.D.   On: 05/27/2016 17:09   US Abdomen Limited Ruq  Result Date: 05/27/2016 CLINICAL DATA:  Fever with right abdominal pain EXAM: US ABDOMEN LIMITED - RIGHT UPPER QUADRANT COMPARISON:  None. FINDINGS: Gallbladder: No gallstones are evident. The gallbladder wall appears thickened and edematous. Minimal pericholecystic fluid noted. No sonographic Eulah Pont  sign noted by sonographer; note, however, the patient was given pain medication prior to this study which could mask focal tenderness to interrogation. Common bile duct: Diameter: 6 mm. No intrahepatic or extrahepatic biliary duct dilatation. Liver: In the anterior segment of the right lobe of the liver, there is a 2.8 x 2.2 x 2.8 cm cystic area containing minimal septations. No other focal liver lesion evident. Within normal limits in parenchymal echogenicity. IMPRESSION: The appearance of the gallbladder is concerning for acalculus cholecystitis. Cystic area containing minimal septations in the anterior segment right lobe of the liver. This area appears benign. No biliary duct dilatation evident. Electronically Signed   By: Bretta BangWilliam  Woodruff III M.D.   On: 05/27/2016 09:49    Cardiac Studies   Echo: 05/27/16: Study Conclusions  - Left ventricle: The cavity size was normal. The estimated   ejection fraction was 40%. There is akinesis of the mid   anteroseptal and apical septal myocardium. There is akinesis of   the entireinferior and apical myocardium. There is akinesis of   the apicalanterior myocardium. The study is not technically   sufficient to allow evaluation of LV diastolic function. - Aortic valve: Trileaflet; mildly thickened, mildly calcified   leaflets. There was trivial regurgitation. - Aorta: Aortic root dimension: 38 mm (ED). - Aortic root: The aortic root was mildly dilated. - Pulmonary arteries: PA peak pressure: 41 mm Hg (S).  Impressions:  - The right ventricular systolic pressure was increased  consistent   with mild pulmonary hypertension.  Patient Profile     75 year old man who [place], history of right nephrectomy, admitted 11/27 with generalized weakness and fevers, labs showed rhabdomyolysis with CK of 28 K, ARF. Blood cultures subsequently growing Escherichia coli. Echo shows EF 40% with regional wall motion abnormality.  Assessment & Plan    1. LV dysfunction. Regional wall motion abnormality suggests CAD but no prior history. Suspect some of LV dysfunction related to sepsis syndrome. Will need further assessment once recovered from acute illness. Currently volume overloaded due to volume resuscitation and RF. Plan for dialysis today. Not a candidate for any cardiac meds at this point other than ASA due to hypotension and RF. If he tolerated dialysis well today would consider adding beta blocker. Would hold off on statin due to acute elevation of liver enzymes. 2. E coli sepsis. ? If source UTI versus cholecystitis. On antibiotics per CCM 3. Severe Rhabdomyolysis. 4. ARF secondary to #2 and #3. 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 Acute respiratory distress secondary to volume overload, sepsis, and acidosis. 6. Metabolic acidosis. Improved on bicarb. Should resolve with dialysis. 7. Acute acalculous cholecystitis.  8. Uncontrolled DM.   Signed, Milton Sagona [place], MD  05/28/2016, 8:55 AM

## 2016-05-29 DIAGNOSIS — E119 Type 2 diabetes mellitus without complications: Secondary | ICD-10-CM

## 2016-05-29 DIAGNOSIS — R7881 Bacteremia: Secondary | ICD-10-CM

## 2016-05-29 DIAGNOSIS — Z794 Long term (current) use of insulin: Secondary | ICD-10-CM

## 2016-05-29 DIAGNOSIS — N390 Urinary tract infection, site not specified: Secondary | ICD-10-CM

## 2016-05-29 DIAGNOSIS — I5021 Acute systolic (congestive) heart failure: Secondary | ICD-10-CM

## 2016-05-29 LAB — GLUCOSE, CAPILLARY
GLUCOSE-CAPILLARY: 185 mg/dL — AB (ref 65–99)
GLUCOSE-CAPILLARY: 193 mg/dL — AB (ref 65–99)
GLUCOSE-CAPILLARY: 203 mg/dL — AB (ref 65–99)
GLUCOSE-CAPILLARY: 250 mg/dL — AB (ref 65–99)
GLUCOSE-CAPILLARY: 267 mg/dL — AB (ref 65–99)
Glucose-Capillary: 177 mg/dL — ABNORMAL HIGH (ref 65–99)

## 2016-05-29 LAB — CULTURE, BLOOD (ROUTINE X 2)

## 2016-05-29 LAB — RENAL FUNCTION PANEL
Albumin: 2.1 g/dL — ABNORMAL LOW (ref 3.5–5.0)
Anion gap: 15 (ref 5–15)
BUN: 70 mg/dL — AB (ref 6–20)
CHLORIDE: 95 mmol/L — AB (ref 101–111)
CO2: 24 mmol/L (ref 22–32)
Calcium: 8.2 mg/dL — ABNORMAL LOW (ref 8.9–10.3)
Creatinine, Ser: 7.54 mg/dL — ABNORMAL HIGH (ref 0.61–1.24)
GFR, EST AFRICAN AMERICAN: 7 mL/min — AB (ref >60)
GFR, EST NON AFRICAN AMERICAN: 6 mL/min — AB (ref >60)
Glucose, Bld: 199 mg/dL — ABNORMAL HIGH (ref 65–99)
POTASSIUM: 4.5 mmol/L (ref 3.5–5.1)
Phosphorus: 4.5 mg/dL (ref 2.5–4.6)
Sodium: 134 mmol/L — ABNORMAL LOW (ref 135–145)

## 2016-05-29 LAB — URINE CULTURE

## 2016-05-29 LAB — CBC
HCT: 29.1 % — ABNORMAL LOW (ref 39.0–52.0)
Hemoglobin: 10.2 g/dL — ABNORMAL LOW (ref 13.0–17.0)
MCH: 30.4 pg (ref 26.0–34.0)
MCHC: 35.1 g/dL (ref 30.0–36.0)
MCV: 86.6 fL (ref 78.0–100.0)
PLATELETS: 124 10*3/uL — AB (ref 150–400)
RBC: 3.36 MIL/uL — ABNORMAL LOW (ref 4.22–5.81)
RDW: 15 % (ref 11.5–15.5)
WBC: 11.5 10*3/uL — AB (ref 4.0–10.5)

## 2016-05-29 LAB — HEPATITIS B SURFACE ANTIBODY,QUALITATIVE: Hep B S Ab: NONREACTIVE

## 2016-05-29 LAB — MAGNESIUM: Magnesium: 2.1 mg/dL (ref 1.7–2.4)

## 2016-05-29 LAB — CK: CK TOTAL: 6520 U/L — AB (ref 49–397)

## 2016-05-29 LAB — HCV COMMENT:

## 2016-05-29 LAB — HEPATITIS C ANTIBODY (REFLEX): HCV Ab: 0.1 s/co ratio (ref 0.0–0.9)

## 2016-05-29 MED ORDER — SODIUM CHLORIDE 0.9 % IV SOLN: 100.0000 mL | INTRAVENOUS | Status: DC | PRN

## 2016-05-29 MED ORDER — DEXTROSE 5 % IV SOLN: 1.0000 g | INTRAVENOUS | Status: DC

## 2016-05-29 MED ORDER — HEPARIN SODIUM (PORCINE) 1000 UNIT/ML DIALYSIS: 1000.0000 [IU] | INTRAMUSCULAR | Status: DC | PRN

## 2016-05-29 MED ORDER — CARVEDILOL 3.125 MG PO TABS
3.1250 mg | ORAL_TABLET | Freq: Two times a day (BID) | ORAL | Status: DC
  Administered 2016-05-29 – 2016-05-30 (×4): 3.125 mg via ORAL
  Filled 2016-05-29 (×5): qty 1

## 2016-05-29 MED ORDER — PENTAFLUOROPROP-TETRAFLUOROETH EX AERO: 1.0000 "application " | INHALATION_SPRAY | CUTANEOUS | Status: DC | PRN

## 2016-05-29 MED ORDER — LIDOCAINE-PRILOCAINE 2.5-2.5 % EX CREA: 1.0000 "application " | TOPICAL_CREAM | CUTANEOUS | Status: DC | PRN

## 2016-05-29 MED ORDER — ALTEPLASE 2 MG IJ SOLR: 2.0000 mg | Freq: Once | INTRAMUSCULAR | Status: DC | PRN

## 2016-05-29 MED ORDER — DEXTROSE 5 % IV SOLN
2.0000 g | INTRAVENOUS | Status: DC
  Administered 2016-05-29 – 2016-06-04 (×7): 2 g via INTRAVENOUS
  Filled 2016-05-29 (×10): qty 2

## 2016-05-29 MED ORDER — CARVEDILOL 6.25 MG PO TABS: 6.2500 mg | ORAL_TABLET | Freq: Two times a day (BID) | ORAL | Status: DC

## 2016-05-29 MED ORDER — CALCITRIOL 0.25 MCG PO CAPS
0.2500 ug | ORAL_CAPSULE | Freq: Every day | ORAL | Status: DC
  Administered 2016-05-29 – 2016-06-05 (×8): 0.25 ug via ORAL
  Filled 2016-05-29 (×9): qty 1

## 2016-05-29 MED ORDER — HEPARIN SODIUM (PORCINE) 1000 UNIT/ML DIALYSIS
40.0000 [IU]/kg | Freq: Once | INTRAMUSCULAR | Status: DC
  Filled 2016-05-29: qty 5

## 2016-05-29 MED ORDER — LIDOCAINE HCL (PF) 1 % IJ SOLN: 5.0000 mL | INTRAMUSCULAR | Status: DC | PRN

## 2016-05-29 MED ORDER — RENA-VITE PO TABS
1.0000 | ORAL_TABLET | Freq: Every day | ORAL | Status: DC
  Administered 2016-05-29 – 2016-06-05 (×7): 1 via ORAL
  Filled 2016-05-29 (×8): qty 1

## 2016-05-29 NOTE — Progress Notes (Signed)
PROGRESS NOTE    Jonathan BartersJames Welling  ZOX:[id number] DOB: [birth year] DOA: 05/26/2016 PCP: Pcp Not In System   Brief Narrative:  75 y.o. BM PMHx DM Type 2 uncontrolled with complication , HTN, Solitary Kidney, HLD,.    Patient is in town visiting family for thanksgiving.  2 days ago started to develop generalized weakness, SOB, productive cough, urinary urgency.  Today fell twice.  Was down for up to a couple of hours after first episode.  No LOC.  Fever 101 at home.  Has been having diarrhea today.  Intermittent confusion for past 2 days (at baseline lives at home alone).  Patient brought to ED.  ED Course: In ED patient is septic, has kidney failure with creat of 4.5 (presumably acute), mild transaminitis, CK elevated at 25k (patient has rhabdomyolysis).  Given empiric zosyn and vanc.  UA pending, CXR (see read) is basically radiologist hedging.  Trop os 0.13.  Lactate 3.34, BMP also shows NAG metabolic acidosis with bicarb of 16.   Subjective: 11/30 A/O 4 states had a mass on his right kidney~10 to 15 years ago (benign) however had nephrectomy. Unknown baseline Cr. Not on home O2. Currently states negative CP, negative SOB, negative abdominal pain. Sitting comfortably in bed during HD session.    Assessment & Plan:   Principal Problem:   Sepsis (HCC) Active Problems:   IDDM (insulin dependent diabetes mellitus) (HCC)   HTN (hypertension)   AKI (acute kidney injury) (HCC)   Delirium   Rhabdomyolysis   Transaminitis   Diarrhea   Cholangitis   Cardiomyopathy (HCC)   Demand ischemia (HCC)   Severe Sepsis /Positive Escherichia coli bacteremia and UTI -Complete 2 week course of antibiotic   Acute renal failure (unknown baseline) -Renal ultrasound shows S/P right nephrectomy, multiple left renal cysts. Lab Results  Component Value Date   CREATININE 7.54 (H) 05/29/2016   CREATININE 8.49 (H) 05/28/2016   CREATININE 8.65 (H) 05/28/2016  -Hold all nephrotoxic  medication -Continue HD per Nephrology Dr. Fayrene FearingJames Deterding   Hyperkalemia -Control with HD  Demand ischemia vs MI/Acute Systolic CHF -Patient with abnormal EKG, positive troponins R/O MI vs demand ischemia. -Echocardiogram: Systolic CHF and pulmonary hypertension see results below -Strict in and out since admission -612ml -Daily weight Filed Weights   05/28/16 1345 05/29/16 0300 05/29/16 1100  Weight: 123.2 kg (271 lb 9.7 oz) 118.3 kg (260 lb 12.9 oz) 119.3 kg (263 lb 0.1 oz)  -Cardiology: Further assessment once recovered from acute illness -Coreg 3.125 mg BID   Pulmonary hypertension -See demand ischemia   Rhabdomyolysis -Unknown cause: Possibly secondary to patient laying in bed most of the weekend. -Resolving.  Transaminitis -Rhabdomyolysis? -Cholangitis? Patient has elevated AST, ALT and total bilirubin. -RUQ abdominal ultrasound: Acalculous cholecystitis see results below -Will check enzymes on 12/1 -Lipase: WNL  Acalculus Cholecystitis.   Diabetes type 2 uncontrolled with complication -11/28 Hemoglobin A1c= 10 -Lantus 14 units daily -Resistant SSI  Delirium -Most likely secondary to sepsis: Resolved     DVT prophylaxis: Heparin drip Code Status: Full Family Communication: None Disposition Plan: Per nephrology and cardiology     Consultants:  Cardiology  Nephrology     Procedures/Significant Events:  11/28 Renal ultrasound:-S/P Right nephrectomy. -  Multiple left renal cysts, 11/28 Echocardiogram:- LVEF=40%. akinesis of the mid anteroseptal and apical septal myocardium. There is akinesis of the entireinferior and apical myocardium. There is akinesis of the apicalanterior myocardium.   - Pulmonary arteries: PA peak pressure: 41 mm Hg (S). 11/28 RUQ abdominal  ultrasound: -Gallbladder concerning for acalculus cholecystitis. -Cystic area containing minimal septations in the anterior segment right lobe of the liver. Appears benign. -No biliary duct  dilatation evident.    VENTILATOR SETTINGS:    Cultures 11/27 blood 2 Positive Escherichia coli 11/28 urine positive for Escherichia coli   Antimicrobials: Ceftaz 11/30>> Zosyn 11/27>>11/30 Vancomycin 11/27>> 11/28    Devices None   LINES / TUBES:      Continuous Infusions: .  sodium bicarbonate 150 mEq in sterile water 1000 mL infusion 10 mL/hr at 05/28/16 1900     Objective: Vitals:   05/29/16 1104 05/29/16 1130 05/29/16 1200 05/29/16 1230  BP: 134/78 131/71 124/75 134/73  Pulse: 80 89 85 88  Resp: (!) 29 (!) 28 (!) 26 (!) 24  Temp:      TempSrc:      SpO2:  98%    Weight:      Height:        Intake/Output Summary (Last 24 hours) at 05/29/16 1255 Last data filed at 05/29/16 1000  Gross per 24 hour  Intake              680 ml  Output             3205 ml  Net            -2525 ml   Filed Weights   05/28/16 1345 05/29/16 0300 05/29/16 1100  Weight: 123.2 kg (271 lb 9.7 oz) 118.3 kg (260 lb 12.9 oz) 119.3 kg (263 lb 0.1 oz)    Examination:  General: A/O 4, No acute respiratory distress Eyes: negative scleral hemorrhage, negative anisocoria, negative icterus ENT: Negative Runny nose, negative gingival bleeding, Neck:  Negative scars, masses, torticollis, lymphadenopathy, JVD Lungs: Clear to auscultation bilaterally without wheezes or crackles Cardiovascular: Regular rate and rhythm without murmur gallop or rub normal S1 and S2 Abdomen: negative abdominal pain, positive distention, positive soft, bowel sounds, no rebound, no ascites, no appreciable mass Extremities: No significant cyanosis, clubbing, or edema bilateral lower extremities Skin: Negative rashes, lesions, ulcers Psychiatric:  Negative depression, negative anxiety, negative fatigue, negative mania  Central nervous system:  Cranial nerves II through XII intact, tongue/uvula midline, all extremities muscle strength 5/5, sensation intact throughout,negative dysarthria, negative expressive  aphasia, negative receptive aphasia.  .     Data Reviewed: Care during the described time interval was provided by me .  I have reviewed this patient's available data, including medical history, events of note, physical examination, and all test results as part of my evaluation. I have personally reviewed and interpreted all radiology studies.  CBC:  Recent Labs Lab 05/26/16 2305 05/27/16 0259 05/28/16 0634 05/29/16 1127  WBC 10.3 6.9 15.8* 11.5*  NEUTROABS 8.8*  --  13.6*  --   HGB 11.2* 11.7* 11.0* 10.2*  HCT 32.6* 33.7* 31.2* 29.1*  MCV 89.1 88.5 87.2 86.6  PLT 95* 89* 97* 124*   Basic Metabolic Panel:  Recent Labs Lab 05/27/16 0853  05/27/16 1431 05/27/16 1852 05/27/16 2229 05/28/16 0634 05/29/16 0239  NA 131*  < > 131* 132* 134* 136  137 134*  K 5.3*  < > 5.5* 5.2* 4.9  4.9 4.6  4.6 4.5  CL 101  < > 101 100* 100* 98*  99* 95*  CO2 16*  < > 17* 19* 21* 23  24 24   GLUCOSE 324*  < > 341* 315* 249* 198*  199* 199*  BUN 58*  < > 63* 68* 70* 75*  74*  70*  CREATININE 6.60*  < > 6.97* 7.49* 7.53* 8.65*  8.49* 7.54*  CALCIUM 8.6*  < > 8.4* 8.4* 8.4* 8.5*  8.5* 8.2*  MG 1.7  --   --   --   --  2.0 2.1  PHOS  --   --   --  3.5 3.1 3.2  3.3 4.5  < > = values in this interval not displayed. GFR: Estimated Creatinine Clearance: 11.6 mL/min (by C-G formula based on SCr of 7.54 mg/dL (H)). Liver Function Tests:  Recent Labs Lab 05/26/16 2305 05/27/16 0259 05/27/16 1852 05/27/16 2229 05/28/16 0634 05/29/16 0239  AST 310* 355*  --   --  376*  --   ALT 101* 113*  --   --  148*  --   ALKPHOS 110 118  --   --  102  --   BILITOT 1.2 1.6*  --   --  1.2  --   PROT 6.1* 6.6  --   --  6.8  --   ALBUMIN 2.8* 2.8* 2.6* 2.4* 2.5*  2.4* 2.1*    Recent Labs Lab 05/27/16 0853  LIPASE 20   No results for input(s): AMMONIA in the last 168 hours. Coagulation Profile: No results for input(s): INR, PROTIME in the last 168 hours. Cardiac Enzymes:  Recent Labs Lab  05/26/16 2305 05/27/16 0025 05/27/16 0259 05/27/16 1212 05/27/16 1852 05/27/16 2229 05/28/16 0634 05/29/16 0239  CKTOTAL 25,775*  --  16,109* 28,316* 25,261* 22,886* 19,483* 6,520*  TROPONINI 0.13* 0.15* 0.48* 0.18* 0.12*  --   --   --    BNP (last 3 results) No results for input(s): PROBNP in the last 8760 hours. HbA1C:  Recent Labs  05/27/16 1212  HGBA1C 10.0*   CBG:  Recent Labs Lab 05/28/16 1633 05/28/16 2034 05/29/16 0025 05/29/16 0416 05/29/16 0900  GLUCAP 143* 217* 193* 203* 185*   Lipid Profile:  Recent Labs  05/27/16 1212  CHOL 82  HDL 14*  LDLCALC 7  TRIG 306*  CHOLHDL 5.9   Thyroid Function Tests: No results for input(s): TSH, T4TOTAL, FREET4, T3FREE, THYROIDAB in the last 72 hours. Anemia Panel: No results for input(s): VITAMINB12, FOLATE, FERRITIN, TIBC, IRON, RETICCTPCT in the last 72 hours. Urine analysis:    Component Value Date/Time   COLORURINE AMBER (A) 05/27/2016 0244   APPEARANCEUR TURBID (A) 05/27/2016 0244   LABSPEC 1.021 05/27/2016 0244   PHURINE 6.5 05/27/2016 0244   GLUCOSEU 250 (A) 05/27/2016 0244   HGBUR LARGE (A) 05/27/2016 0244   BILIRUBINUR SMALL (A) 05/27/2016 0244   KETONESUR 15 (A) 05/27/2016 0244   PROTEINUR >300 (A) 05/27/2016 0244   NITRITE NEGATIVE 05/27/2016 0244   LEUKOCYTESUR LARGE (A) 05/27/2016 0244   Sepsis Labs: @LABRCNTIP (procalcitonin:4,lacticidven:4)  ) Recent Results (from the past 240 hour(s))  Blood Culture (routine x 2)     Status: Abnormal   Collection Time: 05/26/16 11:55 PM  Result Value Ref Range Status   Specimen Description BLOOD LEFT ANTECUBITAL  Final   Special Requests BOTTLES DRAWN AEROBIC AND ANAEROBIC 5CC  Final   Culture  Setup Time   Final    GRAM NEGATIVE RODS IN BOTH AEROBIC AND ANAEROBIC BOTTLES Organism ID to follow CRITICAL RESULT CALLED TO, READ BACK BY AND VERIFIED WITH: N. Batchelder Pharm.D. 13:15 05/27/16  ([email or web address])    Culture ESCHERICHIA COLI (A)  Final   Report  Status 05/29/2016 FINAL  Final   Organism ID, Bacteria ESCHERICHIA COLI  Final  Susceptibility   Escherichia coli - MIC*    AMPICILLIN <=2 SENSITIVE Sensitive     CEFAZOLIN <=4 SENSITIVE Sensitive     CEFEPIME <=1 SENSITIVE Sensitive     CEFTAZIDIME <=1 SENSITIVE Sensitive     CEFTRIAXONE <=1 SENSITIVE Sensitive     CIPROFLOXACIN <=0.25 SENSITIVE Sensitive     GENTAMICIN <=1 SENSITIVE Sensitive     IMIPENEM <=0.25 SENSITIVE Sensitive     TRIMETH/SULFA <=20 SENSITIVE Sensitive     AMPICILLIN/SULBACTAM <=2 SENSITIVE Sensitive     PIP/TAZO <=4 SENSITIVE Sensitive     Extended ESBL NEGATIVE Sensitive     * ESCHERICHIA COLI  Blood Culture ID Panel (Reflexed)     Status: Abnormal   Collection Time: 05/26/16 11:55 PM  Result Value Ref Range Status   Enterococcus species NOT DETECTED NOT DETECTED Final   Listeria monocytogenes NOT DETECTED NOT DETECTED Final   Staphylococcus species NOT DETECTED NOT DETECTED Final   Staphylococcus aureus NOT DETECTED NOT DETECTED Final   Streptococcus species NOT DETECTED NOT DETECTED Final   Streptococcus agalactiae NOT DETECTED NOT DETECTED Final   Streptococcus pneumoniae NOT DETECTED NOT DETECTED Final   Streptococcus pyogenes NOT DETECTED NOT DETECTED Final   Acinetobacter baumannii NOT DETECTED NOT DETECTED Final   Enterobacteriaceae species DETECTED (A) NOT DETECTED Final    Comment: CRITICAL RESULT CALLED TO, READ BACK BY AND VERIFIED WITH: N. Batchelder Pharm.D. 13:15 05/27/16 ([email or web address])    Enterobacter cloacae complex NOT DETECTED NOT DETECTED Final   Escherichia coli DETECTED (A) NOT DETECTED Final    Comment: CRITICAL RESULT CALLED TO, READ BACK BY AND VERIFIED WITH: N. Batchelder Pharm.D. 13:15 05/27/16 ([email or web address])    Klebsiella oxytoca NOT DETECTED NOT DETECTED Final   Klebsiella pneumoniae NOT DETECTED NOT DETECTED Final   Proteus species NOT DETECTED NOT DETECTED Final   Serratia marcescens NOT DETECTED NOT DETECTED Final    Carbapenem resistance NOT DETECTED NOT DETECTED Final   Haemophilus influenzae NOT DETECTED NOT DETECTED Final   Neisseria meningitidis NOT DETECTED NOT DETECTED Final   Pseudomonas aeruginosa NOT DETECTED NOT DETECTED Final   Candida albicans NOT DETECTED NOT DETECTED Final   Candida glabrata NOT DETECTED NOT DETECTED Final   Candida krusei NOT DETECTED NOT DETECTED Final   Candida parapsilosis NOT DETECTED NOT DETECTED Final   Candida tropicalis NOT DETECTED NOT DETECTED Final  Blood Culture (routine x 2)     Status: Abnormal   Collection Time: 05/27/16 12:20 AM  Result Value Ref Range Status   Specimen Description BLOOD RIGHT HAND  Final   Special Requests IN PEDIATRIC BOTTLE 1CC  Final   Culture  Setup Time   Final    GRAM NEGATIVE RODS AEROBIC BOTTLE ONLY CRITICAL RESULT CALLED TO, READ BACK BY AND VERIFIED WITH: N. Batchelder Pharm.D. 13:15 05/27/16  ([email or web address])    Culture (A)  Final    ESCHERICHIA COLI SUSCEPTIBILITIES PERFORMED ON PREVIOUS CULTURE WITHIN THE LAST 5 DAYS.    Report Status 05/29/2016 FINAL  Final  Urine culture     Status: Abnormal   Collection Time: 05/27/16  2:44 AM  Result Value Ref Range Status   Specimen Description URINE, CLEAN CATCH  Final   Special Requests NONE  Final   Culture >=100,000 COLONIES/mL ESCHERICHIA COLI (A)  Final   Report Status 05/29/2016 FINAL  Final   Organism ID, Bacteria ESCHERICHIA COLI (A)  Final      Susceptibility   Escherichia coli - MIC*    AMPICILLIN 4 SENSITIVE Sensitive  CEFAZOLIN <=4 SENSITIVE Sensitive     CEFTRIAXONE <=1 SENSITIVE Sensitive     CIPROFLOXACIN <=0.25 SENSITIVE Sensitive     GENTAMICIN <=1 SENSITIVE Sensitive     IMIPENEM <=0.25 SENSITIVE Sensitive     NITROFURANTOIN <=16 SENSITIVE Sensitive     TRIMETH/SULFA <=20 SENSITIVE Sensitive     AMPICILLIN/SULBACTAM <=2 SENSITIVE Sensitive     PIP/TAZO <=4 SENSITIVE Sensitive     Extended ESBL NEGATIVE Sensitive     * >=100,000 COLONIES/mL ESCHERICHIA  COLI  MRSA PCR Screening     Status: None   Collection Time: 05/27/16  3:03 PM  Result Value Ref Range Status   MRSA by PCR NEGATIVE NEGATIVE Final    Comment:        The GeneXpert MRSA Assay (FDA approved for NASAL specimens only), is one component of a comprehensive MRSA colonization surveillance program. It is not intended to diagnose MRSA infection nor to guide or monitor treatment for MRSA infections.          Radiology Studies: Dg Chest Portable 1 View  Result Date: 05/28/2016 CLINICAL DATA:  Placement of the right dialysis line EXAM: PORTABLE CHEST 1 VIEW COMPARISON:  05/27/2016 FINDINGS: Right side hemodialysis catheter tip is in the SVC. No pneumothorax. Heart is normal size. Mild vascular congestion. No confluent opacities or effusions. IMPRESSION: Right central line tip in the SVC.  No pneumothorax. Electronically Signed   By: Charlett Nose M.D.   On: 05/28/2016 10:02   Dg Chest Port 1 View  Result Date: 05/27/2016 CLINICAL DATA:  Shortness of breath EXAM: PORTABLE CHEST 1 VIEW COMPARISON:  05/26/2016 FINDINGS: Normal heart size with mild central vascular congestion. No focal pneumonia, collapse or consolidation. Negative for edema, effusion or pneumothorax. Trachea is midline. Atherosclerosis of the aorta. No acute osseous finding. IMPRESSION: Mild central vascular congestion without CHF or focal pneumonia. Electronically Signed   By: Judie Petit.  Shick M.D.   On: 05/27/2016 17:09        Scheduled Meds: . calcitRIOL  0.25 mcg Oral Daily  . carvedilol  3.125 mg Oral BID WC  . cefTRIAXone (ROCEPHIN)  IV  2 g Intravenous Q24H  . famotidine  20 mg Oral Daily  . heparin  40 Units/kg Dialysis Once in dialysis  . heparin  40 Units/kg Dialysis Once in dialysis  . heparin subcutaneous  5,000 Units Subcutaneous Q8H  . insulin aspart  0-20 Units Subcutaneous Q4H  . insulin glargine  14 Units Subcutaneous Q24H  . multivitamin  1 tablet Oral QHS   Continuous Infusions: .   sodium bicarbonate 150 mEq in sterile water 1000 mL infusion 10 mL/hr at 05/28/16 1900     LOS: 2 days    Time spent: 40 minutes    WOODS, Roselind Messier, MD [facility] Pager [phone]   If 7PM-7AM, please contact night-coverage [email or web address] Password [email or web address] 05/29/2016, 12:55 PM

## 2016-05-29 NOTE — Progress Notes (Signed)
Subjective: Interval History: has complaints still N, V, sleepy.  Objective: Vital signs in last 24 hours: Temp:  [97.8 F (36.6 C)-99.4 F (37.4 C)] 97.9 F (36.6 C) (11/30 0300) Pulse Rate:  [83-108] 85 (11/30 0700) Resp:  [12-35] 28 (11/30 0700) BP: (112-144)/(60-94) 127/69 (11/30 0700) SpO2:  [92 %-99 %] 94 % (11/30 0700) Weight:  [118.3 kg (260 lb 12.9 oz)-123.2 kg (271 lb 9.7 oz)] 118.3 kg (260 lb 12.9 oz) (11/30 0300) Weight change: -0.224 kg (-7.9 oz)  Intake/Output from previous day: 11/29 0701 - 11/30 0700 In: 629.3 [P.O.:120; I.V.:409.3; IV Piggyback:100] Out: 3285 [Urine:285] Intake/Output this shift: No intake/output data recorded.  General appearance: cooperative, moderately obese and slowed mentation Neck: IJ cath Cardio: S1, S2 normal and systolic murmur: holosystolic 2/6, blowing at apex GI: obese, pos bs, soft Extremities: edema 1+  Lab Results:  Recent Labs  05/27/16 0259 05/28/16 0634  WBC 6.9 15.8*  HGB 11.7* 11.0*  HCT 33.7* 31.2*  PLT 89* 97*   BMET:  Recent Labs  05/28/16 0634 05/29/16 0239  NA 136  137 134*  K 4.6  4.6 4.5  CL 98*  99* 95*  CO2 23  24 24   GLUCOSE 198*  199* 199*  BUN 75*  74* 70*  CREATININE 8.65*  8.49* 7.54*  CALCIUM 8.5*  8.5* 8.2*    Recent Labs  05/27/16 1653  PTH 194*   Iron Studies: No results for input(s): IRON, TIBC, TRANSFERRIN, FERRITIN in the last 72 hours.  Studies/Results: Dg Chest Portable 1 View  Result Date: 05/28/2016 CLINICAL DATA:  Placement of the right dialysis line EXAM: PORTABLE CHEST 1 VIEW COMPARISON:  05/27/2016 FINDINGS: Right side hemodialysis catheter tip is in the SVC. No pneumothorax. Heart is normal size. Mild vascular congestion. No confluent opacities or effusions. IMPRESSION: Right central line tip in the SVC.  No pneumothorax. Electronically Signed   By: Charlett NoseKevin  Dover M.D.   On: 05/28/2016 10:02   Dg Chest Port 1 View  Result Date: 05/27/2016 CLINICAL DATA:   Shortness of breath EXAM: PORTABLE CHEST 1 VIEW COMPARISON:  05/26/2016 FINDINGS: Normal heart size with mild central vascular congestion. No focal pneumonia, collapse or consolidation. Negative for edema, effusion or pneumothorax. Trachea is midline. Atherosclerosis of the aorta. No acute osseous finding. IMPRESSION: Mild central vascular congestion without CHF or focal pneumonia. Electronically Signed   By: Judie PetitM.  Shick M.D.   On: 05/27/2016 17:09   [place] Abdomen Limited Ruq  Result Date: 05/27/2016 CLINICAL DATA:  Fever with right abdominal pain EXAM: US ABDOMEN LIMITED - RIGHT UPPER QUADRANT COMPARISON:  None. FINDINGS: Gallbladder: No gallstones are evident. The gallbladder wall appears thickened and edematous. Minimal pericholecystic fluid noted. No sonographic Murphy sign noted by sonographer; note, however, the patient was given pain medication prior to this study which could mask focal tenderness to interrogation. Common bile duct: Diameter: 6 mm. No intrahepatic or extrahepatic biliary duct dilatation. Liver: In the anterior segment of the right lobe of the liver, there is a 2.8 x 2.2 x 2.8 cm cystic area containing minimal septations. No other focal liver lesion evident. Within normal limits in parenchymal echogenicity. IMPRESSION: The appearance of the gallbladder is concerning for acalculus cholecystitis. Cystic area containing minimal septations in the anterior segment right lobe of the liver. This area appears benign. No biliary duct dilatation evident. Electronically Signed   By: Bretta BangWilliam  Woodruff III M.D.   On: 05/27/2016 09:49    I have reviewed the patient's current medications.  Assessment/Plan: 1 AKI  Oliguric renal injury hemodynamic and rhabdo.  Uremic, vol and acid/base better 2 HTN not an issue 3 Dm controlle 4 cholecystitis 5 Anemia 6 HPTH vit D 7 Obesity 9 Sepsis urinary, will need Urology P HD again, vit D, ab    LOS: 2 days   Yanira Tolsma,Milledge L 05/29/2016,8:08 AM

## 2016-05-29 NOTE — Procedures (Signed)
I was present at this session.  I have reviewed the session itself and made appropriate changes.  HD via temp cath.  bp 120s-130s.  To get off 2.5. tol well  Lam Bjorklund,Ayaansh L 11/30/201712:20 PM

## 2016-05-29 NOTE — Progress Notes (Signed)
Patient Name: Jonathan BartersJames Waller Date of Encounter: 05/29/2016  Primary Cardiologist: Saidee Geremia [place] MD- new  Hospital Problem List     Principal Problem:   Sepsis [facility](HCC) Active Problems:   IDDM (insulin dependent diabetes mellitus) (HCC)   HTN (hypertension)   AKI (acute kidney injury) (HCC)   Delirium   Rhabdomyolysis   Transaminitis   Diarrhea   Cholangitis   Cardiomyopathy (HCC)   Demand ischemia (HCC)     Subjective   Feels much better today. Denies SOB or chest pain. Weak. No abdominal pain  Inpatient Medications    Scheduled Meds: . famotidine  20 mg Oral Daily  . heparin  40 Units/kg Dialysis Once in dialysis  . heparin subcutaneous  5,000 Units Subcutaneous Q8H  . insulin aspart  0-20 Units Subcutaneous Q4H  . insulin glargine  14 Units Subcutaneous Q24H  . mouth rinse  15 mL Mouth Rinse BID  . piperacillin-tazobactam (ZOSYN)  IV  3.375 g Intravenous Q12H   Continuous Infusions: .  sodium bicarbonate 150 mEq in sterile water 1000 mL infusion 10 mL/hr at 05/28/16 1900   PRN Meds: sodium chloride, sodium chloride, acetaminophen **OR** acetaminophen, alteplase, heparin, lidocaine (PF), lidocaine-prilocaine, ondansetron (ZOFRAN) IV, pentafluoroprop-tetrafluoroeth, phenol   Vital Signs    Vitals:   05/29/16 0400 05/29/16 0500 05/29/16 0600 05/29/16 0700  BP: (!) 133/94 126/70 125/88 127/69  Pulse: 91 83 87 85  Resp: (!) 24 (!) 34 (!) 30 (!) 28  Temp:      TempSrc:      SpO2: 98% 98% 97% 94%  Weight:      Height:        Intake/Output Summary (Last 24 hours) at 05/29/16 0725 Last data filed at 05/29/16 0600  Gross per 24 hour  Intake           619.25 ml  Output             3285 ml  Net         -2665.75 ml   Filed Weights   05/28/16 0413 05/28/16 1345 05/29/16 0300  Weight: 271 lb 9.7 oz (123.2 kg) 271 lb 9.7 oz (123.2 kg) 260 lb 12.9 oz (118.3 kg)    Physical Exam    GEN: Well nourished, well developed, in NAD  HEENT: Grossly normal.  Neck:  Supple, no JVD, carotid bruits, or masses. Cardiac: RRR, no murmurs, rubs, or gallops. No clubbing, cyanosis, edema.  Radials/DP/PT 2+ and equal bilaterally.  Respiratory:  Respiratory rate normal, nonlabored, clear to auscultation bilaterally. GI: Soft, nontender, nondistended, BS + x 4. MS: no deformity or atrophy. Skin: warm and dry, no rash. Neuro:  Strength and sensation are intact. Psych: AAOx3.  Normal affect.  Labs    CBC  Recent Labs  05/26/16 2305 05/27/16 0259 05/28/16 0634  WBC 10.3 6.9 15.8*  NEUTROABS 8.8*  --  13.6*  HGB 11.2* 11.7* 11.0*  HCT 32.6* 33.7* 31.2*  MCV 89.1 88.5 87.2  PLT 95* 89* 97*   Basic Metabolic Panel  Recent Labs  05/28/16 0634 05/29/16 0239  NA 136  137 134*  K 4.6  4.6 4.5  CL 98*  99* 95*  CO2 23  24 24   GLUCOSE 198*  199* 199*  BUN 75*  74* 70*  CREATININE 8.65*  8.49* 7.54*  CALCIUM 8.5*  8.5* 8.2*  MG 2.0 2.1  PHOS 3.2  3.3 4.5   Liver Function Tests  Recent Labs  05/27/16 0259  05/28/16 0634 05/29/16 0239  AST 355*  --  376*  --   ALT 113*  --  148*  --   ALKPHOS 118  --  102  --   BILITOT 1.6*  --  1.2  --   PROT 6.6  --  6.8  --   ALBUMIN 2.8*  < > 2.5*  2.4* 2.1*  < > = values in this interval not displayed.  Recent Labs  05/27/16 0853  LIPASE 20   Cardiac Enzymes  Recent Labs  05/27/16 0259 05/27/16 1212 05/27/16 1852 05/27/16 2229 05/28/16 0634 05/29/16 0239  CKTOTAL 50,354* 28,316* 25,261* 22,886* 19,483* 6,520*  TROPONINI 0.48* 0.18* 0.12*  --   --   --    BNP Invalid input(s): POCBNP D-Dimer No results for input(s): DDIMER in the last 72 hours. Hemoglobin A1C  Recent Labs  05/27/16 1212  HGBA1C 10.0*   Fasting Lipid Panel  Recent Labs  05/27/16 1212  CHOL 82  HDL 14*  LDLCALC 7  TRIG 306*  CHOLHDL 5.9   Thyroid Function Tests No results for input(s): TSH, T4TOTAL, T3FREE, THYROIDAB in the last 72 hours.  Invalid input(s): FREET3  Telemetry    NSR -  Personally Reviewed  ECG    Sinus tachy, RBBB, LAFB - Personally Reviewed  Radiology    Dg Chest Portable 1 View  Result Date: 05/28/2016 CLINICAL DATA:  Placement of the right dialysis line EXAM: PORTABLE CHEST 1 VIEW COMPARISON:  05/27/2016 FINDINGS: Right side hemodialysis catheter tip is in the SVC. No pneumothorax. Heart is normal size. Mild vascular congestion. No confluent opacities or effusions. IMPRESSION: Right central line tip in the SVC.  No pneumothorax. Electronically Signed   By: Charlett Nose M.D.   On: 05/28/2016 10:02   Dg Chest Port 1 View  Result Date: 05/27/2016 CLINICAL DATA:  Shortness of breath EXAM: PORTABLE CHEST 1 VIEW COMPARISON:  05/26/2016 FINDINGS: Normal heart size with mild central vascular congestion. No focal pneumonia, collapse or consolidation. Negative for edema, effusion or pneumothorax. Trachea is midline. Atherosclerosis of the aorta. No acute osseous finding. IMPRESSION: Mild central vascular congestion without CHF or focal pneumonia. Electronically Signed   By: Judie Petit.  Shick M.D.   On: 05/27/2016 17:09   US Abdomen Limited Ruq  Result Date: 05/27/2016 CLINICAL DATA:  Fever with right abdominal pain EXAM: US ABDOMEN LIMITED - RIGHT UPPER QUADRANT COMPARISON:  None. FINDINGS: Gallbladder: No gallstones are evident. The gallbladder wall appears thickened and edematous. Minimal pericholecystic fluid noted. No sonographic Murphy sign noted by sonographer; note, however, the patient was given pain medication prior to this study which could mask focal tenderness to interrogation. Common bile duct: Diameter: 6 mm. No intrahepatic or extrahepatic biliary duct dilatation. Liver: In the anterior segment of the right lobe of the liver, there is a 2.8 x 2.2 x 2.8 cm cystic area containing minimal septations. No other focal liver lesion evident. Within normal limits in parenchymal echogenicity. IMPRESSION: The appearance of the gallbladder is concerning for acalculus  cholecystitis. Cystic area containing minimal septations in the anterior segment right lobe of the liver. This area appears benign. No biliary duct dilatation evident. Electronically Signed   By: Bretta Bang III M.D.   On: 05/27/2016 09:49    Cardiac Studies   Echo: 05/27/16: Study Conclusions  - Left ventricle: The cavity size was normal. The estimated   ejection fraction was 40%. There is akinesis of the mid   anteroseptal and apical septal myocardium. There is akinesis of   the  entireinferior and apical myocardium. There is akinesis of   the apicalanterior myocardium. The study is not technically   sufficient to allow evaluation of LV diastolic function. - Aortic valve: Trileaflet; mildly thickened, mildly calcified   leaflets. There was trivial regurgitation. - Aorta: Aortic root dimension: 38 mm (ED). - Aortic root: The aortic root was mildly dilated. - Pulmonary arteries: PA peak pressure: 41 mm Hg (S).  Impressions:  - The right ventricular systolic pressure was increased consistent   with mild pulmonary hypertension.  Patient Profile     75 year old man who is visiting his daughter from [place], history of right nephrectomy, admitted 11/27 with generalized weakness and fevers, labs showed rhabdomyolysis with CK of 28 K, ARF. Blood cultures subsequently growing Escherichia coli. Echo shows EF 40% with regional wall motion abnormality.  Assessment & Plan    1. LV dysfunction. Regional wall motion abnormality suggests CAD but no prior history. Suspect some of LV dysfunction related to sepsis syndrome. Will need further assessment once recovered from acute illness. Volume overloaded due to volume resuscitation and RF. Tolerated dialysis well yesterday and volume status improved. Not a candidate for ACEi/ARB due to  RF. BP is good so we will add Coreg today.  Would hold off on statin due to acute elevation of liver enzymes but consider starting once acute illness  improved. 2. E coli sepsis. ? If source UTI versus cholecystitis. On antibiotics per CCM 3. Severe Rhabdomyolysis. CK improving. 4. ARF secondary to #2 and #3. 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 Nephrology following. 5. Acute respiratory distress secondary to volume overload, sepsis, and acidosis. Resolved.  6. Metabolic acidosis. Improved on bicarb. Should resolve with dialysis. 7. Acute acalculous cholecystitis.  8. Uncontrolled DM.   Signed, Niema Carrara [place], MD  05/29/2016, 7:25 AM

## 2016-05-29 NOTE — Progress Notes (Signed)
Inpatient Diabetes Program Recommendations  AACE/ADA: New Consensus Statement on Inpatient Glycemic Control (2015)  Target Ranges:  Prepandial:   less than 140 mg/dL      Peak postprandial:   less than 180 mg/dL (1-2 hours)      Critically ill patients:  140 - 180 mg/dL   Results for ABDIAZIZ, KRETZER (MRN [id number]) as of 05/29/2016 08:47  Ref. Range 05/28/2016 04:11 05/28/2016 10:29 05/28/2016 11:42 05/28/2016 16:33 05/28/2016 20:34 05/29/2016 00:25 05/29/2016 04:16  Glucose-Capillary Latest Ref Range: 65 - 99 mg/dL 361 (H) 443 (H) 154 (H) 143 (H) 217 (H) 193 (H) 203 (H)   Review of Glycemic Control  Diabetes history: DM 2 Outpatient Diabetes medications: 70/30 0-20 units TID Current orders for Inpatient glycemic control: Lantus 14 units Q24hours, Novolog 0-20 units Q4 hours  Inpatient Diabetes Program Recommendations: Glucose still elevated above inpatient goal of 140-180 mg/dl. Please consider increasing basal insulin to Lantus 18 units Q24hours.  Thanks,  Christena Deem RN, MSN, [facility] Inpatient Diabetes Coordinator Team Pager [phone] (8a-5p)

## 2016-05-29 NOTE — Progress Notes (Signed)
PULMONARY / CRITICAL CARE MEDICINE   Name: Jonathan BartersJames Bussey MRN: [id number] DOB: [birth year]    ADMISSION DATE:  05/26/2016 CONSULTATION DATE:  05/29/2016  REFERRING MD:  Joseph ArtWoods, triad  CHIEF COMPLAINT:  Respiratory distress, renal failure  HISTORY OF PRESENT ILLNESS:   75 year old man who is visiting his daughter from [place], history of right nephrectomy, admitted 11/27 with generalized weakness and fevers, labs showed rhabdomyolysis with CK of 20 5K, creatinine 4.7, lactate of 3.3, WBC 10 k, blood cultures subsequently growing Escherichia coli. He was treated with broad-spectrum antibiotics, admitted to stepdown, creatinine has now increased to 6.9, CK has increased to 28K, renal and cardiology consulted PCCM is consulted due to concern for respiratory failure especially with fluid and bicarbonate infusions Ultrasound abdomen is suggestive acalculous cholecystitis and no hydronephrosis was noted   SUBJECTIVE:  Afebrile Breathing ok Nausea subsided  VITAL SIGNS: BP 108/61   Pulse 88   Temp 98.6 F (37 C) (Oral)   Resp (!) 33   Ht 6\' 2"  (1.88 m)   Wt 260 lb 12.9 oz (118.3 kg)   SpO2 93%   BMI 33.49 kg/m   HEMODYNAMICS:    VENTILATOR SETTINGS:    INTAKE / OUTPUT: I/O last 3 completed shifts: In: 2483 [P.O.:120; I.V.:2213; IV Piggyback:150] Out: 3485 [Urine:485; Other:3000]  PHYSICAL EXAMINATION: Obese man sitting in bed in no respiratory distress, able to speak in full sentences No pallor, icterus Decreased breath sounds bilateral, no rhonchi S1 and S2 normal, no S3 or rub Soft, nondistended, nontender abdomen, no guarding Grossly nonfocal, alert and interactive no asterixis No pedal edema  LABS:  BMET  Recent Labs Lab 05/27/16 2229 05/28/16 0634 05/29/16 0239  NA 134* 136  137 134*  K 4.9  4.9 4.6  4.6 4.5  CL 100* 98*  99* 95*  CO2 21* 23  24 24   BUN 70* 75*  74* 70*  CREATININE 7.53* 8.65*  8.49* 7.54*  GLUCOSE 249* 198*  199* 199*     Electrolytes  Recent Labs Lab 05/27/16 0853  05/27/16 2229 05/28/16 0634 05/29/16 0239  CALCIUM 8.6*  < > 8.4* 8.5*  8.5* 8.2*  MG 1.7  --   --  2.0 2.1  PHOS  --   < > 3.1 3.2  3.3 4.5  < > = values in this interval not displayed.  CBC  Recent Labs Lab 05/26/16 2305 05/27/16 0259 05/28/16 0634  WBC 10.3 6.9 15.8*  HGB 11.2* 11.7* 11.0*  HCT 32.6* 33.7* 31.2*  PLT 95* 89* 97*    Coag's No results for input(s): APTT, INR in the last 168 hours.  Sepsis Markers  Recent Labs Lab 05/26/16 2313 05/27/16 0508 05/27/16 0853  LATICACIDVEN 3.34* 2.1* 2.2*    ABG  Recent Labs Lab 05/27/16 0430  PHART 7.336*  PCO2ART 29.7*  PO2ART 98.0    Liver Enzymes  Recent Labs Lab 05/26/16 2305 05/27/16 0259  05/27/16 2229 05/28/16 0634 05/29/16 0239  AST 310* 355*  --   --  376*  --   ALT 101* 113*  --   --  148*  --   ALKPHOS 110 118  --   --  102  --   BILITOT 1.2 1.6*  --   --  1.2  --   ALBUMIN 2.8* 2.8*  < > 2.4* 2.5*  2.4* 2.1*  < > = values in this interval not displayed.  Cardiac Enzymes  Recent Labs Lab 05/27/16 0259 05/27/16 1212 05/27/16 1852  TROPONINI 0.48*  0.18* 0.12*    Glucose  Recent Labs Lab 05/28/16 1142 05/28/16 1633 05/28/16 2034 05/29/16 0025 05/29/16 0416 05/29/16 0900  GLUCAP 233* 143* 217* 193* 203* 185*    Imaging No results found.   STUDIES:  11/28 [place] abd >> gallbladder wall thickening and edema c/w a calculus cholecystitis, liver cyst 11/28 US renal's >> right nephrectomy, left renal cysts, no hydronephrosis Echo - EF 40%, WMA =  CULTURES: 11/27 blood >> e coli 11/28 urine >> e coli  ANTIBIOTICS: 11/27 zosyn >>11/30 ceftx 11/30 >>  SIGNIFICANT EVENTS:   LINES/TUBES:   DISCUSSION: Escherichia coli sepsis causing AKI and rhabdomyolysis, with concern for fluid overload   ASSESSMENT / PLAN:  PULMONARY A: Acute respiratory distress P:    improved with HD  CARDIOVASCULAR A:   cardiomyopathy ? Of sepsis P:  Reassess once acute issues resolved  RENAL A:  Single kidney AKI - due to sepsis, rhabdomyolysis with aldactone/ ARB on board P:   HD per renal CK coming down  GASTROINTESTINAL A:   Acute acalculous cholecystitis P:   Follow LFts doubt need for chole drain  HEMATOLOGIC A:   thrombocytopenia P:  follow  INFECTIOUS A:   E coli sepsis P:   ceftx   ENDOCRINE A:   Dm-2, uncontrolled   P:   SSI- resistant   Summary -  hopeful for renal recovery eventually, source seems to be UTI not cholecystitis - so doubt need for chole drain  PCCM available as needed Can tr to floor    Cyril Mourning MD. Tonny Bollman. [facility] Pager [phone] If no response call [phone]    05/29/2016, 10:28 AM

## 2016-05-29 NOTE — Progress Notes (Signed)
Per Dr Darrick Penna d/t risk of recurring obstruction, leave foley in place at this time.

## 2016-05-30 DIAGNOSIS — I5021 Acute systolic (congestive) heart failure: Secondary | ICD-10-CM

## 2016-05-30 DIAGNOSIS — R7881 Bacteremia: Secondary | ICD-10-CM

## 2016-05-30 LAB — COMPREHENSIVE METABOLIC PANEL
ALT: 120 U/L — AB (ref 17–63)
AST: 160 U/L — ABNORMAL HIGH (ref 15–41)
Albumin: 1.9 g/dL — ABNORMAL LOW (ref 3.5–5.0)
Alkaline Phosphatase: 110 U/L (ref 38–126)
Anion gap: 16 — ABNORMAL HIGH (ref 5–15)
BUN: 88 mg/dL — ABNORMAL HIGH (ref 6–20)
CHLORIDE: 94 mmol/L — AB (ref 101–111)
CO2: 22 mmol/L (ref 22–32)
CREATININE: 7.49 mg/dL — AB (ref 0.61–1.24)
Calcium: 8.3 mg/dL — ABNORMAL LOW (ref 8.9–10.3)
GFR calc non Af Amer: 6 mL/min — ABNORMAL LOW (ref >60)
GFR, EST AFRICAN AMERICAN: 7 mL/min — AB (ref >60)
Glucose, Bld: 146 mg/dL — ABNORMAL HIGH (ref 65–99)
POTASSIUM: 4.1 mmol/L (ref 3.5–5.1)
SODIUM: 132 mmol/L — AB (ref 135–145)
Total Bilirubin: 0.8 mg/dL (ref 0.3–1.2)
Total Protein: 6.5 g/dL (ref 6.5–8.1)

## 2016-05-30 LAB — CBC
HEMATOCRIT: 30.5 % — AB (ref 39.0–52.0)
HEMOGLOBIN: 10.6 g/dL — AB (ref 13.0–17.0)
MCH: 30.2 pg (ref 26.0–34.0)
MCHC: 34.8 g/dL (ref 30.0–36.0)
MCV: 86.9 fL (ref 78.0–100.0)
PLATELETS: 147 10*3/uL — AB (ref 150–400)
RBC: 3.51 MIL/uL — AB (ref 4.22–5.81)
RDW: 14.9 % (ref 11.5–15.5)
WBC: 8.9 10*3/uL (ref 4.0–10.5)

## 2016-05-30 LAB — GLUCOSE, CAPILLARY
Glucose-Capillary: 130 mg/dL — ABNORMAL HIGH (ref 65–99)
Glucose-Capillary: 152 mg/dL — ABNORMAL HIGH (ref 65–99)
Glucose-Capillary: 176 mg/dL — ABNORMAL HIGH (ref 65–99)
Glucose-Capillary: 249 mg/dL — ABNORMAL HIGH (ref 65–99)
Glucose-Capillary: 262 mg/dL — ABNORMAL HIGH (ref 65–99)
Glucose-Capillary: 344 mg/dL — ABNORMAL HIGH (ref 65–99)

## 2016-05-30 LAB — PHOSPHORUS: Phosphorus: 4.9 mg/dL — ABNORMAL HIGH (ref 2.5–4.6)

## 2016-05-30 LAB — IRON AND TIBC
Iron: 61 ug/dL (ref 45–182)
Saturation Ratios: 36 % (ref 17.9–39.5)
TIBC: 168 ug/dL — ABNORMAL LOW (ref 250–450)
UIBC: 107 ug/dL

## 2016-05-30 LAB — CK: Total CK: 2989 U/L — ABNORMAL HIGH (ref 49–397)

## 2016-05-30 LAB — HEPATITIS B SURFACE ANTIGEN: Hepatitis B Surface Ag: NEGATIVE

## 2016-05-30 LAB — MAGNESIUM: Magnesium: 2.3 mg/dL (ref 1.7–2.4)

## 2016-05-30 MED ORDER — SODIUM CHLORIDE 0.9% FLUSH
10.0000 mL | INTRAVENOUS | Status: DC | PRN
  Administered 2016-06-01 – 2016-06-03 (×2): 10 mL
  Filled 2016-05-30 (×2): qty 40

## 2016-05-30 MED ORDER — INSULIN ASPART 100 UNIT/ML ~~LOC~~ SOLN
8.0000 [IU] | Freq: Three times a day (TID) | SUBCUTANEOUS | Status: DC
  Administered 2016-05-30 – 2016-06-06 (×11): 8 [IU] via SUBCUTANEOUS

## 2016-05-30 NOTE — Progress Notes (Signed)
PROGRESS NOTE    Jonathan Waller  ONG:[id number] DOB: 11/25/1940 DOA: 05/26/2016 PCP: Pcp Not In System   Brief Narrative:  75 y.o. BM PMHx DM Type 2 uncontrolled with complication , HTN, Solitary Kidney, HLD,.    Patient is in town visiting family for thanksgiving.  2 days ago started to develop generalized weakness, SOB, productive cough, urinary urgency.  Today fell twice.  Was down for up to a couple of hours after first episode.  No LOC.  Fever 101 at home.  Has been having diarrhea today.  Intermittent confusion for past 2 days (at baseline lives at home alone).  Patient brought to ED.  ED Course: In ED patient is septic, has kidney failure with creat of 4.5 (presumably acute), mild transaminitis, CK elevated at 25k (patient has rhabdomyolysis).  Given empiric zosyn and vanc.  UA pending, CXR (see read) is basically radiologist hedging.  Trop os 0.13.  Lactate 3.34, BMP also shows NAG metabolic acidosis with bicarb of 16.   Subjective: 12/1 A/O 4 states had a mass on his right kidney~10 to 15 years ago (benign) however had nephrectomy. Unknown baseline Cr. Not on home O2. Currently states negative CP, negative SOB, negative abdominal pain. Sitting comfortably in chair. States ambulated around ward today. Initially had some lightheadedness but resolved.    Assessment & Plan:   Principal Problem:   Sepsis (HCC) Active Problems:   IDDM (insulin dependent diabetes mellitus) (HCC)   HTN (hypertension)   AKI (acute kidney injury) (HCC)   Delirium   Rhabdomyolysis   Transaminitis   Diarrhea   Cholangitis   Cardiomyopathy (HCC)   Demand ischemia (HCC)   Bacteremia due to Escherichia coli   Urinary tract infection without hematuria   Acute systolic CHF (congestive heart failure) (HCC)   Severe Sepsis /Positive Escherichia coli bacteremia and UTI -Complete 2 week course of antibiotic   Acute renal failure (unknown baseline) -Renal ultrasound shows S/P right nephrectomy,  multiple left renal cysts. Lab Results  Component Value Date   CREATININE 7.49 (H) 05/30/2016   CREATININE 7.54 (H) 05/29/2016   CREATININE 8.49 (H) 05/28/2016   CREATININE 8.65 (H) 05/28/2016  -Hold all nephrotoxic medication -Continue HD per Nephrology Dr. Fayrene Fearing Deterding   Hyperkalemia -Control with HD  Demand ischemia vs MI/Acute Systolic CHF -Patient with abnormal EKG, positive troponins R/O MI vs demand ischemia. -Echocardiogram: Systolic CHF and pulmonary hypertension see results below -Strict in and out since admission -2.3 L -Daily weight Filed Weights   05/29/16 1100 05/29/16 1405 05/29/16 2148  Weight: 119.3 kg (263 lb 0.1 oz) 115.8 kg (255 lb 4.7 oz) 117.1 kg (258 lb 3.2 oz)  -Cardiology: Further assessment once recovered from acute illness: Will need catheterization. -Coreg 3.125 mg BID   Pulmonary hypertension -See demand ischemia   Rhabdomyolysis -Unknown cause: Possibly secondary to patient laying in bed most of the weekend. -CK continues to trend down though remains elevated.  Transaminitis -Resolving with resolution of Rhabdomyolysis  Acalculus Cholecystitis. -Most likely was incidental finding, as liver enzymes are trending down with resolution of rhabdo.  Diabetes type 2 uncontrolled with complication -11/28 Hemoglobin A1c= 10 -Lantus 14 units daily -Start NovoLog 8 units QAC -Resistant SSI  Delirium -Most likely secondary to sepsis: Resolved     DVT prophylaxis: Subcutaneous heparin Code Status: Full Family Communication: None Disposition Plan: Per nephrology and cardiology     Consultants:  Cardiology  Nephrology     Procedures/Significant Events:  11/28 Renal ultrasound:-S/P Right nephrectomy. -  Multiple left renal cysts, 11/28 Echocardiogram:- LVEF=40%. akinesis of the mid anteroseptal and apical septal myocardium. There is akinesis of the entireinferior and apical myocardium. There is akinesis of the apicalanterior  myocardium.   - Pulmonary arteries: PA peak pressure: 41 mm Hg (S). 11/28 RUQ abdominal ultrasound: -Gallbladder concerning for acalculus cholecystitis. -Cystic area containing minimal septations in the anterior segment right lobe of the liver. Appears benign. -No biliary duct dilatation evident.    VENTILATOR SETTINGS:    Cultures 11/27 blood 2 Positive Escherichia coli 11/28 urine positive for Escherichia coli   Antimicrobials: Ceftaz 11/30>> Zosyn 11/27>>11/30 Vancomycin 11/27>> 11/28    Devices None   LINES / TUBES:      Continuous Infusions: .  sodium bicarbonate 150 mEq in sterile water 1000 mL infusion 10 mL/hr at 05/28/16 1900     Objective: Vitals:   05/29/16 2000 05/29/16 2148 05/30/16 0418 05/30/16 0946  BP: 119/71 119/68 (!) 106/59 (!) 126/57  Pulse:  79 84 71  Resp:  (!) 24 20 18   Temp:  97.8 F (36.6 C) 98.1 F (36.7 C) 97.8 F (36.6 C)  TempSrc:  Oral Oral Oral  SpO2:  95% 97% 96%  Weight:  117.1 kg (258 lb 3.2 oz)    Height:        Intake/Output Summary (Last 24 hours) at 05/30/16 1648 Last data filed at 05/30/16 1034  Gross per 24 hour  Intake              730 ml  Output                0 ml  Net              730 ml   Filed Weights   05/29/16 1100 05/29/16 1405 05/29/16 2148  Weight: 119.3 kg (263 lb 0.1 oz) 115.8 kg (255 lb 4.7 oz) 117.1 kg (258 lb 3.2 oz)    Examination:  General: A/O 4, No acute respiratory distress Eyes: negative scleral hemorrhage, negative anisocoria, negative icterus ENT: Negative Runny nose, negative gingival bleeding, Neck:  Negative scars, masses, torticollis, lymphadenopathy, JVD Lungs: Clear to auscultation bilaterally without wheezes or crackles Cardiovascular: Regular rate and rhythm without murmur gallop or rub normal S1 and S2 Abdomen: negative abdominal pain, positive distention, positive soft, bowel sounds, no rebound, no ascites, no appreciable mass Extremities: No significant cyanosis,  clubbing, or edema bilateral lower extremities Skin: Negative rashes, lesions, ulcers Psychiatric:  Negative depression, negative anxiety, negative fatigue, negative mania  Central nervous system:  Cranial nerves II through XII intact, tongue/uvula midline, all extremities muscle strength 5/5, sensation intact throughout,negative dysarthria, negative expressive aphasia, negative receptive aphasia.  .     Data Reviewed: Care during the described time interval was provided by me .  I have reviewed this patient's available data, including medical history, events of note, physical examination, and all test results as part of my evaluation. I have personally reviewed and interpreted all radiology studies.  CBC:  Recent Labs Lab 05/26/16 2305 05/27/16 0259 05/28/16 0634 05/29/16 1127 05/30/16 0504  WBC 10.3 6.9 15.8* 11.5* 8.9  NEUTROABS 8.8*  --  13.6*  --   --   HGB 11.2* 11.7* 11.0* 10.2* 10.6*  HCT 32.6* 33.7* 31.2* 29.1* 30.5*  MCV 89.1 88.5 87.2 86.6 86.9  PLT 95* 89* 97* 124* 147*   Basic Metabolic Panel:  Recent Labs Lab 05/27/16 0853  05/27/16 1852 05/27/16 2229 05/28/16 0634 05/29/16 0239 05/30/16 0504  NA 131*  < >  132* 134* 136  137 134* 132*  K 5.3*  < > 5.2* 4.9  4.9 4.6  4.6 4.5 4.1  CL 101  < > 100* 100* 98*  99* 95* 94*  CO2 16*  < > 19* 21* 23  24 24 22   GLUCOSE 324*  < > 315* 249* 198*  199* 199* 146*  BUN 58*  < > 68* 70* 75*  74* 70* 88*  CREATININE 6.60*  < > 7.49* 7.53* 8.65*  8.49* 7.54* 7.49*  CALCIUM 8.6*  < > 8.4* 8.4* 8.5*  8.5* 8.2* 8.3*  MG 1.7  --   --   --  2.0 2.1 2.3  PHOS  --   --  3.5 3.1 3.2  3.3 4.5 4.9*  < > = values in this interval not displayed. GFR: Estimated Creatinine Clearance: 11.6 mL/min (by C-G formula based on SCr of 7.49 mg/dL (H)). Liver Function Tests:  Recent Labs Lab 05/26/16 2305 05/27/16 0259 05/27/16 1852 05/27/16 2229 05/28/16 0634 05/29/16 0239 05/30/16 0504  AST 310* 355*  --   --  376*  --   160*  ALT 101* 113*  --   --  148*  --  120*  ALKPHOS 110 118  --   --  102  --  110  BILITOT 1.2 1.6*  --   --  1.2  --  0.8  PROT 6.1* 6.6  --   --  6.8  --  6.5  ALBUMIN 2.8* 2.8* 2.6* 2.4* 2.5*  2.4* 2.1* 1.9*    Recent Labs Lab 05/27/16 0853  LIPASE 20   No results for input(s): AMMONIA in the last 168 hours. Coagulation Profile: No results for input(s): INR, PROTIME in the last 168 hours. Cardiac Enzymes:  Recent Labs Lab 05/26/16 2305 05/27/16 0025 05/27/16 0259 05/27/16 1212 05/27/16 1852 05/27/16 2229 05/28/16 4098 05/29/16 0239 05/30/16 0504  CKTOTAL 11,914*  --  78,295* 28,316* 25,261* 22,886* 19,483* 6,520* 2,989*  TROPONINI 0.13* 0.15* 0.48* 0.18* 0.12*  --   --   --   --    BNP (last 3 results) No results for input(s): PROBNP in the last 8760 hours. HbA1C: No results for input(s): HGBA1C in the last 72 hours. CBG:  Recent Labs Lab 05/30/16 0002 05/30/16 0358 05/30/16 0749 05/30/16 1145 05/30/16 1624  GLUCAP 250* 176* 130* 152* 249*   Lipid Profile: No results for input(s): CHOL, HDL, LDLCALC, TRIG, CHOLHDL, LDLDIRECT in the last 72 hours. Thyroid Function Tests: No results for input(s): TSH, T4TOTAL, FREET4, T3FREE, THYROIDAB in the last 72 hours. Anemia Panel:  Recent Labs  05/30/16 1129  TIBC 168*  IRON 61   Urine analysis:    Component Value Date/Time   COLORURINE AMBER (A) 05/27/2016 0244   APPEARANCEUR TURBID (A) 05/27/2016 0244   LABSPEC 1.021 05/27/2016 0244   PHURINE 6.5 05/27/2016 0244   GLUCOSEU 250 (A) 05/27/2016 0244   HGBUR LARGE (A) 05/27/2016 0244   BILIRUBINUR SMALL (A) 05/27/2016 0244   KETONESUR 15 (A) 05/27/2016 0244   PROTEINUR >300 (A) 05/27/2016 0244   NITRITE NEGATIVE 05/27/2016 0244   LEUKOCYTESUR LARGE (A) 05/27/2016 0244   Sepsis Labs: @LABRCNTIP (procalcitonin:4,lacticidven:4)  ) Recent Results (from the past 240 hour(s))  Blood Culture (routine x 2)     Status: Abnormal   Collection Time:  05/26/16 11:55 PM  Result Value Ref Range Status   Specimen Description BLOOD LEFT ANTECUBITAL  Final   Special Requests BOTTLES DRAWN AEROBIC AND ANAEROBIC 5CC  Final  Culture  Setup Time   Final    GRAM NEGATIVE RODS IN BOTH AEROBIC AND ANAEROBIC BOTTLES Organism ID to follow CRITICAL RESULT CALLED TO, READ BACK BY AND VERIFIED WITH: N. Batchelder Pharm.D. 13:15 05/27/16  ([email or web address])    Culture ESCHERICHIA COLI (A)  Final   Report Status 05/29/2016 FINAL  Final   Organism ID, Bacteria ESCHERICHIA COLI  Final      Susceptibility   Escherichia coli - MIC*    AMPICILLIN <=2 SENSITIVE Sensitive     CEFAZOLIN <=4 SENSITIVE Sensitive     CEFEPIME <=1 SENSITIVE Sensitive     CEFTAZIDIME <=1 SENSITIVE Sensitive     CEFTRIAXONE <=1 SENSITIVE Sensitive     CIPROFLOXACIN <=0.25 SENSITIVE Sensitive     GENTAMICIN <=1 SENSITIVE Sensitive     IMIPENEM <=0.25 SENSITIVE Sensitive     TRIMETH/SULFA <=20 SENSITIVE Sensitive     AMPICILLIN/SULBACTAM <=2 SENSITIVE Sensitive     PIP/TAZO <=4 SENSITIVE Sensitive     Extended ESBL NEGATIVE Sensitive     * ESCHERICHIA COLI  Blood Culture ID Panel (Reflexed)     Status: Abnormal   Collection Time: 05/26/16 11:55 PM  Result Value Ref Range Status   Enterococcus species NOT DETECTED NOT DETECTED Final   Listeria monocytogenes NOT DETECTED NOT DETECTED Final   Staphylococcus species NOT DETECTED NOT DETECTED Final   Staphylococcus aureus NOT DETECTED NOT DETECTED Final   Streptococcus species NOT DETECTED NOT DETECTED Final   Streptococcus agalactiae NOT DETECTED NOT DETECTED Final   Streptococcus pneumoniae NOT DETECTED NOT DETECTED Final   Streptococcus pyogenes NOT DETECTED NOT DETECTED Final   Acinetobacter baumannii NOT DETECTED NOT DETECTED Final   Enterobacteriaceae species DETECTED (A) NOT DETECTED Final    Comment: CRITICAL RESULT CALLED TO, READ BACK BY AND VERIFIED WITH: N. Batchelder Pharm.D. 13:15 05/27/16 ([email or web address])    Enterobacter  cloacae complex NOT DETECTED NOT DETECTED Final   Escherichia coli DETECTED (A) NOT DETECTED Final    Comment: CRITICAL RESULT CALLED TO, READ BACK BY AND VERIFIED WITH: N. Batchelder Pharm.D. 13:15 05/27/16 ([email or web address])    Klebsiella oxytoca NOT DETECTED NOT DETECTED Final   Klebsiella pneumoniae NOT DETECTED NOT DETECTED Final   Proteus species NOT DETECTED NOT DETECTED Final   Serratia marcescens NOT DETECTED NOT DETECTED Final   Carbapenem resistance NOT DETECTED NOT DETECTED Final   Haemophilus influenzae NOT DETECTED NOT DETECTED Final   Neisseria meningitidis NOT DETECTED NOT DETECTED Final   Pseudomonas aeruginosa NOT DETECTED NOT DETECTED Final   Candida albicans NOT DETECTED NOT DETECTED Final   Candida glabrata NOT DETECTED NOT DETECTED Final   Candida krusei NOT DETECTED NOT DETECTED Final   Candida parapsilosis NOT DETECTED NOT DETECTED Final   Candida tropicalis NOT DETECTED NOT DETECTED Final  Blood Culture (routine x 2)     Status: Abnormal   Collection Time: 05/27/16 12:20 AM  Result Value Ref Range Status   Specimen Description BLOOD RIGHT HAND  Final   Special Requests IN PEDIATRIC BOTTLE 1CC  Final   Culture  Setup Time   Final    GRAM NEGATIVE RODS AEROBIC BOTTLE ONLY CRITICAL RESULT CALLED TO, READ BACK BY AND VERIFIED WITH: N. Batchelder Pharm.D. 13:15 05/27/16  ([email or web address])    Culture (A)  Final    ESCHERICHIA COLI SUSCEPTIBILITIES PERFORMED ON PREVIOUS CULTURE WITHIN THE LAST 5 DAYS.    Report Status 05/29/2016 FINAL  Final  Urine culture     Status: Abnormal   Collection Time: 05/27/16  2:44  AM  Result Value Ref Range Status   Specimen Description URINE, CLEAN CATCH  Final   Special Requests NONE  Final   Culture >=100,000 COLONIES/mL ESCHERICHIA COLI (A)  Final   Report Status 05/29/2016 FINAL  Final   Organism ID, Bacteria ESCHERICHIA COLI (A)  Final      Susceptibility   Escherichia coli - MIC*    AMPICILLIN 4 SENSITIVE Sensitive     CEFAZOLIN <=4  SENSITIVE Sensitive     CEFTRIAXONE <=1 SENSITIVE Sensitive     CIPROFLOXACIN <=0.25 SENSITIVE Sensitive     GENTAMICIN <=1 SENSITIVE Sensitive     IMIPENEM <=0.25 SENSITIVE Sensitive     NITROFURANTOIN <=16 SENSITIVE Sensitive     TRIMETH/SULFA <=20 SENSITIVE Sensitive     AMPICILLIN/SULBACTAM <=2 SENSITIVE Sensitive     PIP/TAZO <=4 SENSITIVE Sensitive     Extended ESBL NEGATIVE Sensitive     * >=100,000 COLONIES/mL ESCHERICHIA COLI  MRSA PCR Screening     Status: None   Collection Time: 05/27/16  3:03 PM  Result Value Ref Range Status   MRSA by PCR NEGATIVE NEGATIVE Final    Comment:        The GeneXpert MRSA Assay (FDA approved for NASAL specimens only), is one component of a comprehensive MRSA colonization surveillance program. It is not intended to diagnose MRSA infection nor to guide or monitor treatment for MRSA infections.          Radiology Studies: No results found.      Scheduled Meds: . calcitRIOL  0.25 mcg Oral Daily  . carvedilol  3.125 mg Oral BID WC  . cefTRIAXone (ROCEPHIN)  IV  2 g Intravenous Q24H  . famotidine  20 mg Oral Daily  . heparin subcutaneous  5,000 Units Subcutaneous Q8H  . insulin aspart  0-20 Units Subcutaneous Q4H  . insulin glargine  14 Units Subcutaneous Q24H  . multivitamin  1 tablet Oral QHS   Continuous Infusions: .  sodium bicarbonate 150 mEq in sterile water 1000 mL infusion 10 mL/hr at 05/28/16 1900     LOS: 3 days    Time spent: 40 minutes    Panfilo Ketchum, Roselind MessierURTIS J, MD [facility] Pager [phone]   If 7PM-7AM, please contact night-coverage [email or web address] Password [email or web address] 05/30/2016, 4:48 PM

## 2016-05-30 NOTE — Progress Notes (Signed)
Patient Name: Jonathan Waller Date of Encounter: 05/30/2016  Primary Cardiologist: Matalie Romberger [place] MD- new  Hospital Problem List     Principal Problem:   Sepsis [facility]) Active Problems:   IDDM (insulin dependent diabetes mellitus) (HCC)   HTN (hypertension)   AKI (acute kidney injury) (HCC)   Delirium   Rhabdomyolysis   Transaminitis   Diarrhea   Cholangitis   Cardiomyopathy (HCC)   Demand ischemia (HCC)   Bacteremia due to Escherichia coli   Urinary tract infection without hematuria   Acute systolic CHF (congestive heart failure) (HCC)     Subjective   Feels much better today. Denies SOB or chest pain. Feels stronger. No abdominal pain  Inpatient Medications    Scheduled Meds: . calcitRIOL  0.25 mcg Oral Daily  . carvedilol  3.125 mg Oral BID WC  . cefTRIAXone (ROCEPHIN)  IV  2 g Intravenous Q24H  . famotidine  20 mg Oral Daily  . heparin subcutaneous  5,000 Units Subcutaneous Q8H  . insulin aspart  0-20 Units Subcutaneous Q4H  . insulin glargine  14 Units Subcutaneous Q24H  . multivitamin  1 tablet Oral QHS   Continuous Infusions: .  sodium bicarbonate 150 mEq in sterile water 1000 mL infusion 10 mL/hr at 05/28/16 1900   PRN Meds: acetaminophen **OR** acetaminophen, ondansetron (ZOFRAN) IV, phenol   Vital Signs    Vitals:   05/29/16 1800 05/29/16 2000 05/29/16 2148 05/30/16 0418  BP: 126/69 119/71 119/68 (!) 106/59  Pulse: 86  79 84  Resp: (!) 29  (!) 24 20  Temp:   97.8 F (36.6 C) 98.1 F (36.7 C)  TempSrc:   Oral Oral  SpO2: 93%  95% 97%  Weight:   258 lb 3.2 oz (117.1 kg)   Height:        Intake/Output Summary (Last 24 hours) at 05/30/16 0919 Last data filed at 05/30/16 7829  Gross per 24 hour  Intake              370 ml  Output             2625 ml  Net            -2255 ml   Filed Weights   05/29/16 1100 05/29/16 1405 05/29/16 2148  Weight: 263 lb 0.1 oz (119.3 kg) 255 lb 4.7 oz (115.8 kg) 258 lb 3.2 oz (117.1 kg)    Physical Exam      GEN: Well nourished, well developed, in NAD  HEENT: Grossly normal.  Neck: Supple, no JVD, carotid bruits, or masses. Cardiac: RRR, no murmurs, rubs, or gallops. No clubbing, cyanosis, edema.  Radials/DP/PT 2+ and equal bilaterally.  Respiratory:  Respiratory rate normal, nonlabored, clear to auscultation bilaterally. GI: Soft, nontender, nondistended, BS + x 4. MS: no deformity or atrophy. Skin: warm and dry, no rash. Neuro:  Strength and sensation are intact. Psych: AAOx3.  Normal affect.  Labs    CBC  Recent Labs  05/28/16 0634 05/29/16 1127 05/30/16 0504  WBC 15.8* 11.5* 8.9  NEUTROABS 13.6*  --   --   HGB 11.0* 10.2* 10.6*  HCT 31.2* 29.1* 30.5*  MCV 87.2 86.6 86.9  PLT 97* 124* 147*   Basic Metabolic Panel  Recent Labs  05/29/16 0239 05/30/16 0504  NA 134* 132*  K 4.5 4.1  CL 95* 94*  CO2 24 22  GLUCOSE 199* 146*  BUN 70* 88*  CREATININE 7.54* 7.49*  CALCIUM 8.2* 8.3*  MG 2.1 2.3  PHOS 4.5 4.9*   Liver Function Tests  Recent Labs  05/28/16 0634 05/29/16 0239 05/30/16 0504  AST 376*  --  160*  ALT 148*  --  120*  ALKPHOS 102  --  110  BILITOT 1.2  --  0.8  PROT 6.8  --  6.5  ALBUMIN 2.5*  2.4* 2.1* 1.9*   No results for input(s): LIPASE, AMYLASE in the last 72 hours. Cardiac Enzymes  Recent Labs  05/27/16 1212 05/27/16 1852  05/28/16 0634 05/29/16 0239 05/30/16 0504  CKTOTAL 28,316* 25,261*  < > 19,483* 6,520* 2,989*  TROPONINI 0.18* 0.12*  --   --   --   --   < > = values in this interval not displayed. BNP Invalid input(s): POCBNP D-Dimer No results for input(s): DDIMER in the last 72 hours. Hemoglobin A1C  Recent Labs  05/27/16 1212  HGBA1C 10.0*   Fasting Lipid Panel  Recent Labs  05/27/16 1212  CHOL 82  HDL 14*  LDLCALC 7  TRIG 306*  CHOLHDL 5.9   Thyroid Function Tests No results for input(s): TSH, T4TOTAL, T3FREE, THYROIDAB in the last 72 hours.  Invalid input(s): FREET3  Telemetry    NSR - Personally  Reviewed  ECG    Sinus tachy, RBBB, LAFB - Personally Reviewed  Radiology    Dg Chest Portable 1 View  Result Date: 05/28/2016 CLINICAL DATA:  Placement of the right dialysis line EXAM: PORTABLE CHEST 1 VIEW COMPARISON:  05/27/2016 FINDINGS: Right side hemodialysis catheter tip is in the SVC. No pneumothorax. Heart is normal size. Mild vascular congestion. No confluent opacities or effusions. IMPRESSION: Right central line tip in the SVC.  No pneumothorax. Electronically Signed   By: Charlett Nose M.D.   On: 05/28/2016 10:02    Cardiac Studies   Echo: 05/27/16: Study Conclusions  - Left ventricle: The cavity size was normal. The estimated   ejection fraction was 40%. There is akinesis of the mid   anteroseptal and apical septal myocardium. There is akinesis of   the entireinferior and apical myocardium. There is akinesis of   the apicalanterior myocardium. The study is not technically   sufficient to allow evaluation of LV diastolic function. - Aortic valve: Trileaflet; mildly thickened, mildly calcified   leaflets. There was trivial regurgitation. - Aorta: Aortic root dimension: 38 mm (ED). - Aortic root: The aortic root was mildly dilated. - Pulmonary arteries: PA peak pressure: 41 mm Hg (S).  Impressions:  - The right ventricular systolic pressure was increased consistent   with mild pulmonary hypertension.  Patient Profile     75 year old man who is visiting his daughter from [place], history of right nephrectomy, admitted 11/27 with generalized weakness and fevers, labs showed rhabdomyolysis with CK of 28 K, ARF. Blood cultures subsequently growing Escherichia coli. Echo shows EF 40% with regional wall motion abnormality.  Assessment & Plan    1. LV dysfunction. Regional wall motion abnormality suggests CAD but no prior history. Suspect some of LV dysfunction related to sepsis syndrome. Will need further assessment once recovered from acute illness. Volume overloaded  due to volume resuscitation and RF. Tolerating dialysis well and volume status improved. Not a candidate for ACEi/ARB due to  RF. BP is good so we did add Coreg.  Would hold off on statin due to acute elevation of liver enzymes but consider starting once acute illness improved. It may be worthwhile repeating Echo next week to see if LV function improved after treating sepsis. He is not  a candidate for invasive evaluation until renal function recovers. 2. E coli sepsis. ? If source UTI versus cholecystitis. On antibiotics per CCM 3. Severe Rhabdomyolysis. CK improving. 4. ARF secondary to #2 and #3. 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 Nephrology following. 5. Acute respiratory distress secondary to volume overload, sepsis, and acidosis. Resolved.  6. Metabolic acidosis. Improved on bicarb. Should resolve with dialysis. 7. Acute acalculous cholecystitis.  8. Uncontrolled DM.   Signed, Taeshaun Rames [place], MD  05/30/2016, 9:19 AM

## 2016-05-30 NOTE — Progress Notes (Signed)
Subjective: Interval History: has complaints sleepy.  Objective: Vital signs in last 24 hours: Temp:  [97.6 F (36.4 C)-99 F (37.2 C)] 97.8 F (36.6 C) (12/01 0946) Pulse Rate:  [71-95] 71 (12/01 0946) Resp:  [18-29] 18 (12/01 0946) BP: (106-134)/(57-81) 126/57 (12/01 0946) SpO2:  [93 %-100 %] 96 % (12/01 0946) Weight:  [115.8 kg (255 lb 4.7 oz)-119.3 kg (263 lb 0.1 oz)] 117.1 kg (258 lb 3.2 oz) (11/30 2148) Weight change: -3.9 kg (-8 lb 9.6 oz)  Intake/Output from previous day: 11/30 0701 - 12/01 0700 In: 510 [P.O.:360; I.V.:100; IV Piggyback:50] Out: 2625 [Urine:125] Intake/Output this shift: Total I/O In: 720 [P.O.:720] Out: 0   General appearance: cooperative, moderately obese and slowed mentation Neck: RIJ cath Resp: diminished breath sounds bilaterally Cardio: S1, S2 normal and systolic murmur: holosystolic 2/6, blowing at apex GI: obese, pos bs, nontender Extremities: edema 1+  Lab Results:  Recent Labs  05/29/16 1127 05/30/16 0504  WBC 11.5* 8.9  HGB 10.2* 10.6*  HCT 29.1* 30.5*  PLT 124* 147*   BMET:  Recent Labs  05/29/16 0239 05/30/16 0504  NA 134* 132*  K 4.5 4.1  CL 95* 94*  CO2 24 22  GLUCOSE 199* 146*  BUN 70* 88*  CREATININE 7.54* 7.49*  CALCIUM 8.2* 8.3*    Recent Labs  05/27/16 1653  PTH 194*   Iron Studies: No results for input(s): IRON, TIBC, TRANSFERRIN, FERRITIN in the last 72 hours.  Studies/Results: No results found.  I have reviewed the patient's current medications.  Assessment/Plan: 1 AKI has some urine. Acid/base/k better, will hold off HD and follow chem 2 Anemia  Follow  3 rhabdo  Improving, ? As cause of ^^LFT 4 ? Cholecystitis 5 E coli sepsis on AB 6 CM per cards 7 DM controlled P follow chem, diet, check Fe, PTH    LOS: 3 days   Jonathan Waller,Jonathan Waller 05/30/2016,10:55 AM

## 2016-05-30 NOTE — Care Management Note (Signed)
Case Management Note  Patient Details  Name: Jonathan Waller MRN: [id number] Date of Birth: 31-May-1941  Subjective/Objective:       CM following for progression and d/c planning.              Action/Plan: 05/31/2015 No CM needs identified at this time. Will continue to follow.   Expected Discharge Date:                  Expected Discharge Plan:  Home w Home Health Services  In-House Referral:  NA  Discharge planning Services  CM Consult  Post Acute Care Choice:  Durable Medical Equipment, Home Health Choice offered to:  Patient  DME Arranged:    DME Agency:     HH Arranged:    HH Agency:     Status of Service:  In process, will continue to follow  If discussed at Long Length of Stay Meetings, dates discussed:    Additional Comments:  Starlyn Skeans, RN 05/30/2016, 3:28 PM

## 2016-05-30 NOTE — Care Management Important Message (Signed)
Important Message  Patient Details  Name: Jonathan Waller MRN: [id number] Date of Birth: May 03, 1941   Medicare Important Message Given:  Yes    [name] 05/30/2016, 1:19 PM

## 2016-05-30 NOTE — Progress Notes (Signed)
Inpatient Diabetes Program Recommendations  AACE/ADA: New Consensus Statement on Inpatient Glycemic Control (2015)  Target Ranges:  Prepandial:   less than 140 mg/dL      Peak postprandial:   less than 180 mg/dL (1-2 hours)      Critically ill patients:  140 - 180 mg/dL   Review of Glycemic Control  Diabetes history: DM 2 Outpatient Diabetes medications: 70/30 0-20 units TID  Spoke with patient about diabetes and home regimen for diabetes control. Patient reports that he is followed by his PCP for diabetes management. Patient reports that he just recently went to the doctor and his last A1c per his doctor was around 8%. Spoke with patient about A1c level obtained this admission (10% on 05/27/16). Patient reports he could improve on his diet. Gave patient a meal planning guide. Patient reports thinking that his blood sugars run in the 200's. Spoke with him about A1c and glucose goals. Encouraged patient to meet with PCP to improve glycemic control. Discussed importance of checking CBGs and maintaining good CBG control to prevent long-term and short-term complications. Explained how hyperglycemia leads to damage within blood vessels which lead to the common complications seen with uncontrolled diabetes. Discussed impact of nutrition, exercise, stress, sickness, and medications on diabetes control.Patient verbalized understanding of information discussed and has no further questions at this time related to diabetes.   Thanks,  Christena Deem RN, MSN, [facility] Inpatient Diabetes Coordinator Team Pager [phone] (8a-5p)

## 2016-05-30 NOTE — Evaluation (Signed)
Physical Therapy Evaluation Patient Details Name: Jonathan BartersJames Waller MRN: [id number] DOB: 23-Aug-1940 Today's Date: 05/30/2016   History of Present Illness   Jonathan Waller is a 75 y.o. male with medical history significant of IDDM, HTN, solitary kidney.  Patient is in town visiting family for thanksgiving.  Patient was in usual state of health until 2 days ago when he started to develop generalized weakness, SOB, productive cough, urinary urgency.  Today fell twice.  Was down for up to a couple of hours after first episode.  No LOC.  Fever 101 at home.  Has been having diarrhea today.  Intermittent confusion for past 2 days (at baseline lives at home alone).  Patient brought to ED.  Clinical Impression  Pt admitted with/for general weakness, SOB, productive cough, and urinary urgency..  Pt currently limited functionally due to the problems listed. ( See problems list.)   Pt will benefit from PT to maximize function and safety in order to get ready for next venue listed below.      Follow Up Recommendations No PT follow up    Equipment Recommendations  None recommended by PT    Recommendations for Other Services       Precautions / Restrictions Precautions Precautions: Fall      Mobility  Bed Mobility Overal bed mobility: Modified Independent                Transfers Overall transfer level: Needs assistance   Transfers: Sit to/from Stand Sit to Stand: Min guard         General transfer comment: pt needed to approximate his legs to the bed for support  Ambulation/Gait Ambulation/Gait assistance: Min guard Ambulation Distance (Feet): 200 Feet Assistive device:  (iv pole) Gait Pattern/deviations: Step-through pattern   Gait velocity interpretation: at or above normal speed for age/gender General Gait Details: mildly unsteady benefiting from the iv pole.  Improved in steadiness at time letting go of the pole without incident or deviation  Stairs             Wheelchair Mobility    Modified Rankin (Stroke Patients Only)       Balance Overall balance assessment: Needs assistance Sitting-balance support: No upper extremity supported Sitting balance-Leahy Scale: Good     Standing balance support: No upper extremity supported;Single extremity supported Standing balance-Leahy Scale: Fair                               Pertinent Vitals/Pain Pain Assessment: Faces Faces Pain Scale: Hurts a little bit Pain Location: joint pain Pain Descriptors / Indicators:  (stiffness) Pain Intervention(s): Monitored during session    Home Living Family/patient expects to be discharged to:: Private residence Living Arrangements: Alone Available Help at Discharge: Family;Available 24 hours/day (will be here in the area with family until he can be alone) Type of Home: House Home Access: Stairs to enter Entrance Stairs-Rails: Right;Left Entrance Stairs-Number of Steps: several Home Layout: Two level;Able to live on main level with bedroom/bathroom Home Equipment: None      Prior Function Level of Independence: Independent         Comments: independent, works as a Surveyor, [employer]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     Hand Dominance        Extremity/Trunk Assessment   Upper Extremity Assessment: Overall WFL for tasks assessed           Lower Extremity Assessment: Overall WFL for tasks assessed (general weakness from immobility)  Communication      Cognition Arousal/Alertness: Awake/alert Behavior During Therapy: WFL for tasks assessed/performed Overall Cognitive Status: Within Functional Limits for tasks assessed                      General Comments      Exercises     Assessment/Plan    PT Assessment Patient needs continued PT services  PT Problem List Decreased strength;Decreased balance;Decreased mobility;Decreased activity tolerance          PT Treatment Interventions Gait training;Stair training;Functional  mobility training;Therapeutic activities;Balance training;Patient/family education    PT Goals (Current goals can be found in the Care Plan section)  Acute Rehab PT Goals Patient Stated Goal: get home as soon as I can PT Goal Formulation: With patient Time For Goal Achievement: 06/06/16 Potential to Achieve Goals: Good    Frequency Min 3X/week   Barriers to discharge        Co-evaluation               End of Session   Activity Tolerance: Patient tolerated treatment well Patient left: in chair;with call bell/phone within reach;with chair alarm set Nurse Communication: Mobility status         Time: 2952-8413 PT Time Calculation (min) (ACUTE ONLY): 27 min   Charges:   PT Evaluation $PT Eval Moderate Complexity: 1 Procedure PT Treatments $Gait Training: 8-22 mins   PT G CodesEliseo Gum Osa Campoli 05/30/2016, 4:39 PM  05/30/2016  [place], PT [phone]  (pager)

## 2016-05-31 LAB — GLUCOSE, CAPILLARY
GLUCOSE-CAPILLARY: 269 mg/dL — AB (ref 65–99)
GLUCOSE-CAPILLARY: 148 mg/dL — AB (ref 65–99)
GLUCOSE-CAPILLARY: 96 mg/dL (ref 65–99)
GLUCOSE-CAPILLARY: 177 mg/dL — AB (ref 65–99)
Glucose-Capillary: 158 mg/dL — ABNORMAL HIGH (ref 65–99)
Glucose-Capillary: 206 mg/dL — ABNORMAL HIGH (ref 65–99)

## 2016-05-31 LAB — COMPREHENSIVE METABOLIC PANEL
ALBUMIN: 1.9 g/dL — AB (ref 3.5–5.0)
ALT: 122 U/L — ABNORMAL HIGH (ref 17–63)
ANION GAP: 16 — AB (ref 5–15)
AST: 136 U/L — AB (ref 15–41)
Alkaline Phosphatase: 120 U/L (ref 38–126)
BILIRUBIN TOTAL: 2 mg/dL — AB (ref 0.3–1.2)
BUN: 120 mg/dL — AB (ref 6–20)
CO2: 23 mmol/L (ref 22–32)
Calcium: 8.2 mg/dL — ABNORMAL LOW (ref 8.9–10.3)
Chloride: 93 mmol/L — ABNORMAL LOW (ref 101–111)
Creatinine, Ser: 9.09 mg/dL — ABNORMAL HIGH (ref 0.61–1.24)
GFR calc Af Amer: 6 mL/min — ABNORMAL LOW (ref >60)
GFR calc non Af Amer: 5 mL/min — ABNORMAL LOW (ref >60)
GLUCOSE: 117 mg/dL — AB (ref 65–99)
POTASSIUM: 4.7 mmol/L (ref 3.5–5.1)
SODIUM: 132 mmol/L — AB (ref 135–145)
TOTAL PROTEIN: 5.9 g/dL — AB (ref 6.5–8.1)

## 2016-05-31 LAB — PHOSPHORUS: Phosphorus: 7.1 mg/dL — ABNORMAL HIGH (ref 2.5–4.6)

## 2016-05-31 LAB — MAGNESIUM: Magnesium: 2.6 mg/dL — ABNORMAL HIGH (ref 1.7–2.4)

## 2016-05-31 MED ORDER — CALCIUM ACETATE (PHOS BINDER) 667 MG PO CAPS
ORAL_CAPSULE | ORAL | Status: AC
  Filled 2016-05-31: qty 1

## 2016-05-31 MED ORDER — CALCIUM ACETATE (PHOS BINDER) 667 MG PO CAPS
1334.0000 mg | ORAL_CAPSULE | Freq: Three times a day (TID) | ORAL | Status: DC
  Administered 2016-05-31 – 2016-06-06 (×17): 1334 mg via ORAL
  Filled 2016-05-31 (×17): qty 2

## 2016-05-31 NOTE — Progress Notes (Signed)
Subjective: Interval History: has no complaint , feels better, needs to get moving.  Objective: Vital signs in last 24 hours: Temp:  [97.5 F (36.4 C)-97.8 F (36.6 C)] 97.6 F (36.4 C) (12/02 0414) Pulse Rate:  [69-77] 70 (12/02 0414) Resp:  [17-20] 20 (12/02 0414) BP: (102-126)/(47-57) 108/47 (12/02 0414) SpO2:  [96 %-98 %] 98 % (12/02 0414) Weight:  [117.9 kg (259 lb 14.8 oz)-117.9 kg (260 lb)] 117.9 kg (259 lb 14.8 oz) (12/02 0115) Weight change: -1.365 kg (-3 lb 0.1 oz)  Intake/Output from previous day: 12/01 0701 - 12/02 0700 In: 1320 [P.O.:1320] Out: 750 [Urine:750] Intake/Output this shift: No intake/output data recorded.  General appearance: alert, cooperative and no distress Neck: IJ cath Resp: rales bibasilar Cardio: S1, S2 normal and systolic murmur: holosystolic 2/6, blowing at apex GI: obese, pos bs, soft Extremities: edema 1+  Lab Results:  Recent Labs  05/29/16 1127 05/30/16 0504  WBC 11.5* 8.9  HGB 10.2* 10.6*  HCT 29.1* 30.5*  PLT 124* 147*   BMET:  Recent Labs  05/30/16 0504 05/31/16 0456  NA 132* 132*  K 4.1 4.7  CL 94* 93*  CO2 22 23  GLUCOSE 146* 117*  BUN 88* 120*  CREATININE 7.49* 9.09*  CALCIUM 8.3* 8.2*   No results for input(s): PTH in the last 72 hours. Iron Studies:  Recent Labs  05/30/16 1129  IRON 61  TIBC 168*    Studies/Results: No results found.  I have reviewed the patient's current medications.  Assessment/Plan: 1 AKI/CKD oliguric  No function, rhabdo, sepsis.  Very catabolic, needs HD 2 E coli urinary sepsis on AB 3 Rhabdo 4 CKD need records 5 DM controlled 6 low bps, hold coreg P HD, get records, give AB, advance diet    LOS: 4 days   Sheniece Ruggles,Javari L 05/31/2016,9:32 AM

## 2016-05-31 NOTE — Progress Notes (Signed)
Cardiology will be available over the weekend as needed. Dr. [place] recommended a repeat limited echo next week to evaluate for improvement in LVEF. He is not a cath candidate at this time due to renal insufficiency. Call with questions.  Chrystie Nose, MD, [facility] Attending Cardiologist [facility]

## 2016-05-31 NOTE — Progress Notes (Signed)
PROGRESS NOTE    Sherryl BartersJames Brunn  UJW:[id number]:[id number] DOB: [birth year] DOA: 05/26/2016 PCP: Pcp Not In System   Brief Narrative:  75 y.o. BM PMHx DM Type 2 uncontrolled with complication , HTN, Solitary Kidney, HLD,.    Patient is in town visiting family for thanksgiving.  2 days ago started to develop generalized weakness, SOB, productive cough, urinary urgency.  Today fell twice.  Was down for up to a couple of hours after first episode.  No LOC.  Fever 101 at home.  Has been having diarrhea today.  Intermittent confusion for past 2 days (at baseline lives at home alone).  Patient brought to ED.  ED Course: In ED patient is septic, has kidney failure with creat of 4.5 (presumably acute), mild transaminitis, CK elevated at 25k (patient has rhabdomyolysis).  Given empiric zosyn and vanc.  UA pending, CXR (see read) is basically radiologist hedging.  Trop os 0.13.  Lactate 3.34, BMP also shows NAG metabolic acidosis with bicarb of 16.   Subjective: 12/2  A/O 4 states ambulated hallway this a.m. with negative CP, negative DOE, positive fatigue.    Assessment & Plan:   Principal Problem:   Sepsis (HCC) Active Problems:   IDDM (insulin dependent diabetes mellitus) (HCC)   HTN (hypertension)   AKI (acute kidney injury) (HCC)   Delirium   Rhabdomyolysis   Transaminitis   Diarrhea   Cholangitis   Cardiomyopathy (HCC)   Demand ischemia (HCC)   Bacteremia due to Escherichia coli   Urinary tract infection without hematuria   Acute systolic CHF (congestive heart failure) (HCC)   Severe Sepsis /Positive Escherichia coli bacteremia and UTI -Complete 2 week course of antibiotic   Acute renal failure (unknown baseline) -Renal ultrasound shows S/P right nephrectomy, multiple left renal cysts. Lab Results  Component Value Date   CREATININE 9.09 (H) 05/31/2016   CREATININE 7.49 (H) 05/30/2016   CREATININE 7.54 (H) 05/29/2016  -Hold all nephrotoxic medication -Continue HD per Nephrology  Dr. Fayrene FearingJames Deterding   Hyperkalemia -Control with HD  Demand ischemia vs MI/Acute Systolic CHF -Patient with abnormal EKG, positive troponins R/O MI vs demand ischemia. -Echocardiogram: Systolic CHF and pulmonary hypertension see results below -Strict in and out since admission -2.6 L -Daily weight Filed Weights   05/29/16 2148 05/30/16 2011 05/31/16 0115  Weight: 117.1 kg (258 lb 3.2 oz) 117.9 kg (260 lb) 117.9 kg (259 lb 14.8 oz)  -Cardiology: Further assessment once recovered from acute illness: Will need catheterization. -12/2 Coreg DC to facilitate HD since patient's BP soft  Pulmonary hypertension -See demand ischemia  Rhabdomyolysis -Unknown cause: Possibly secondary to patient laying in bed most of the weekend. -CK continues to trend down though remains elevated.  Transaminitis -Resolving with resolution of Rhabdomyolysis  Acalculus Cholecystitis. -Most likely was incidental finding, as liver enzymes are trending down with resolution of rhabdo.  Diabetes type 2 uncontrolled with complication -11/28 Hemoglobin A1c= 10 -Lantus 14 units daily -Start NovoLog 8 units QAC -Resistant SSI  Delirium -Most likely secondary to sepsis: Resolved     DVT prophylaxis: Subcutaneous heparin Code Status: Full Family Communication: None Disposition Plan: Per nephrology and cardiology     Consultants:  Cardiology  Nephrology     Procedures/Significant Events:  11/28 Renal ultrasound:-S/P Right nephrectomy. -  Multiple left renal cysts, 11/28 Echocardiogram:- LVEF=40%. akinesis of the mid anteroseptal and apical septal myocardium. There is akinesis of the entireinferior and apical myocardium. There is akinesis of the apicalanterior myocardium.   - Pulmonary arteries: PA  peak pressure: 41 mm Hg (S). 11/28 RUQ abdominal ultrasound: -Gallbladder concerning for acalculus cholecystitis. -Cystic area containing minimal septations in the anterior segment right lobe of the  liver. Appears benign. -No biliary duct dilatation evident.    VENTILATOR SETTINGS:    Cultures 11/27 blood 2 Positive Escherichia coli 11/28 urine positive for Escherichia coli   Antimicrobials: Ceftaz 11/30>> Zosyn 11/27>>11/30 Vancomycin 11/27>> 11/28    Devices None   LINES / TUBES:      Continuous Infusions:    Objective: Vitals:   05/30/16 2011 05/31/16 0115 05/31/16 0414 05/31/16 0932  BP:   (!) 108/47 (!) 118/53  Pulse:   70 77  Resp:   20 18  Temp:   97.6 F (36.4 C) 98 F (36.7 C)  TempSrc:    Oral  SpO2:   98% 96%  Weight: 117.9 kg (260 lb) 117.9 kg (259 lb 14.8 oz)    Height:        Intake/Output Summary (Last 24 hours) at 05/31/16 1111 Last data filed at 05/31/16 0600  Gross per 24 hour  Intake              600 ml  Output              750 ml  Net             -150 ml   Filed Weights   05/29/16 2148 05/30/16 2011 05/31/16 0115  Weight: 117.1 kg (258 lb 3.2 oz) 117.9 kg (260 lb) 117.9 kg (259 lb 14.8 oz)    Examination:  General: A/O 4, No acute respiratory distress Eyes: negative scleral hemorrhage, negative anisocoria, negative icterus ENT: Negative Runny nose, negative gingival bleeding, Neck:  Negative scars, masses, torticollis, lymphadenopathy, JVD Lungs: Clear to auscultation bilaterally without wheezes or crackles Cardiovascular: Regular rate and rhythm without murmur gallop or rub normal S1 and S2 Abdomen: negative abdominal pain, positive distention, positive soft, bowel sounds, no rebound, no ascites, no appreciable mass Extremities: No significant cyanosis, clubbing, or edema bilateral lower extremities Skin: Negative rashes, lesions, ulcers Psychiatric:  Negative depression, negative anxiety, negative fatigue, negative mania  Central nervous system:  Cranial nerves II through XII intact, tongue/uvula midline, all extremities muscle strength 5/5, sensation intact throughout,negative dysarthria, negative expressive  aphasia, negative receptive aphasia.  .     Data Reviewed: Care during the described time interval was provided by me .  I have reviewed this patient's available data, including medical history, events of note, physical examination, and all test results as part of my evaluation. I have personally reviewed and interpreted all radiology studies.  CBC:  Recent Labs Lab 05/26/16 2305 05/27/16 0259 05/28/16 0634 05/29/16 1127 05/30/16 0504  WBC 10.3 6.9 15.8* 11.5* 8.9  NEUTROABS 8.8*  --  13.6*  --   --   HGB 11.2* 11.7* 11.0* 10.2* 10.6*  HCT 32.6* 33.7* 31.2* 29.1* 30.5*  MCV 89.1 88.5 87.2 86.6 86.9  PLT 95* 89* 97* 124* 147*   Basic Metabolic Panel:  Recent Labs Lab 05/27/16 0853  05/27/16 2229 05/28/16 0634 05/29/16 0239 05/30/16 0504 05/31/16 0456  NA 131*  < > 134* 136  137 134* 132* 132*  K 5.3*  < > 4.9  4.9 4.6  4.6 4.5 4.1 4.7  CL 101  < > 100* 98*  99* 95* 94* 93*  CO2 16*  < > 21* 23  24 24 22 23   GLUCOSE 324*  < > 249* 198*  199* 199* 146*  117*  BUN 58*  < > 70* 75*  74* 70* 88* 120*  CREATININE 6.60*  < > 7.53* 8.65*  8.49* 7.54* 7.49* 9.09*  CALCIUM 8.6*  < > 8.4* 8.5*  8.5* 8.2* 8.3* 8.2*  MG 1.7  --   --  2.0 2.1 2.3 2.6*  PHOS  --   < > 3.1 3.2  3.3 4.5 4.9* 7.1*  < > = values in this interval not displayed. GFR: Estimated Creatinine Clearance: 9.6 mL/min (by C-G formula based on SCr of 9.09 mg/dL (H)). Liver Function Tests:  Recent Labs Lab 05/26/16 2305 05/27/16 0259  05/27/16 2229 05/28/16 0634 05/29/16 0239 05/30/16 0504 05/31/16 0456  AST 310* 355*  --   --  376*  --  160* 136*  ALT 101* 113*  --   --  148*  --  120* 122*  ALKPHOS 110 118  --   --  102  --  110 120  BILITOT 1.2 1.6*  --   --  1.2  --  0.8 2.0*  PROT 6.1* 6.6  --   --  6.8  --  6.5 5.9*  ALBUMIN 2.8* 2.8*  < > 2.4* 2.5*  2.4* 2.1* 1.9* 1.9*  < > = values in this interval not displayed.  Recent Labs Lab 05/27/16 0853  LIPASE 20   No results for  input(s): AMMONIA in the last 168 hours. Coagulation Profile: No results for input(s): INR, PROTIME in the last 168 hours. Cardiac Enzymes:  Recent Labs Lab 05/26/16 2305 05/27/16 0025 05/27/16 0259 05/27/16 1212 05/27/16 1852 05/27/16 2229 05/28/16 8119 05/29/16 0239 05/30/16 0504  CKTOTAL 14,782*  --  95,621* 28,316* 25,261* 22,886* 19,483* 6,520* 2,989*  TROPONINI 0.13* 0.15* 0.48* 0.18* 0.12*  --   --   --   --    BNP (last 3 results) No results for input(s): PROBNP in the last 8760 hours. HbA1C: No results for input(s): HGBA1C in the last 72 hours. CBG:  Recent Labs Lab 05/30/16 1624 05/30/16 1958 05/31/16 0001 05/31/16 0409 05/31/16 0740  GLUCAP 249* 344* 262* 148* 96   Lipid Profile: No results for input(s): CHOL, HDL, LDLCALC, TRIG, CHOLHDL, LDLDIRECT in the last 72 hours. Thyroid Function Tests: No results for input(s): TSH, T4TOTAL, FREET4, T3FREE, THYROIDAB in the last 72 hours. Anemia Panel:  Recent Labs  05/30/16 1129  TIBC 168*  IRON 61   Urine analysis:    Component Value Date/Time   COLORURINE AMBER (A) 05/27/2016 0244   APPEARANCEUR TURBID (A) 05/27/2016 0244   LABSPEC 1.021 05/27/2016 0244   PHURINE 6.5 05/27/2016 0244   GLUCOSEU 250 (A) 05/27/2016 0244   HGBUR LARGE (A) 05/27/2016 0244   BILIRUBINUR SMALL (A) 05/27/2016 0244   KETONESUR 15 (A) 05/27/2016 0244   PROTEINUR >300 (A) 05/27/2016 0244   NITRITE NEGATIVE 05/27/2016 0244   LEUKOCYTESUR LARGE (A) 05/27/2016 0244   Sepsis Labs: @LABRCNTIP (procalcitonin:4,lacticidven:4)  ) Recent Results (from the past 240 hour(s))  Blood Culture (routine x 2)     Status: Abnormal   Collection Time: 05/26/16 11:55 PM  Result Value Ref Range Status   Specimen Description BLOOD LEFT ANTECUBITAL  Final   Special Requests BOTTLES DRAWN AEROBIC AND ANAEROBIC 5CC  Final   Culture  Setup Time   Final    GRAM NEGATIVE RODS IN BOTH AEROBIC AND ANAEROBIC BOTTLES Organism ID to follow CRITICAL  RESULT CALLED TO, READ BACK BY AND VERIFIED WITH: N. Batchelder Pharm.D. 13:15 05/27/16  ([email or web address])  Culture ESCHERICHIA COLI (A)  Final   Report Status 05/29/2016 FINAL  Final   Organism ID, Bacteria ESCHERICHIA COLI  Final      Susceptibility   Escherichia coli - MIC*    AMPICILLIN <=2 SENSITIVE Sensitive     CEFAZOLIN <=4 SENSITIVE Sensitive     CEFEPIME <=1 SENSITIVE Sensitive     CEFTAZIDIME <=1 SENSITIVE Sensitive     CEFTRIAXONE <=1 SENSITIVE Sensitive     CIPROFLOXACIN <=0.25 SENSITIVE Sensitive     GENTAMICIN <=1 SENSITIVE Sensitive     IMIPENEM <=0.25 SENSITIVE Sensitive     TRIMETH/SULFA <=20 SENSITIVE Sensitive     AMPICILLIN/SULBACTAM <=2 SENSITIVE Sensitive     PIP/TAZO <=4 SENSITIVE Sensitive     Extended ESBL NEGATIVE Sensitive     * ESCHERICHIA COLI  Blood Culture ID Panel (Reflexed)     Status: Abnormal   Collection Time: 05/26/16 11:55 PM  Result Value Ref Range Status   Enterococcus species NOT DETECTED NOT DETECTED Final   Listeria monocytogenes NOT DETECTED NOT DETECTED Final   Staphylococcus species NOT DETECTED NOT DETECTED Final   Staphylococcus aureus NOT DETECTED NOT DETECTED Final   Streptococcus species NOT DETECTED NOT DETECTED Final   Streptococcus agalactiae NOT DETECTED NOT DETECTED Final   Streptococcus pneumoniae NOT DETECTED NOT DETECTED Final   Streptococcus pyogenes NOT DETECTED NOT DETECTED Final   Acinetobacter baumannii NOT DETECTED NOT DETECTED Final   Enterobacteriaceae species DETECTED (A) NOT DETECTED Final    Comment: CRITICAL RESULT CALLED TO, READ BACK BY AND VERIFIED WITH: N. Batchelder Pharm.D. 13:15 05/27/16 ([email or web address])    Enterobacter cloacae complex NOT DETECTED NOT DETECTED Final   Escherichia coli DETECTED (A) NOT DETECTED Final    Comment: CRITICAL RESULT CALLED TO, READ BACK BY AND VERIFIED WITH: N. Batchelder Pharm.D. 13:15 05/27/16 ([email or web address])    Klebsiella oxytoca NOT DETECTED NOT DETECTED Final   Klebsiella pneumoniae  NOT DETECTED NOT DETECTED Final   Proteus species NOT DETECTED NOT DETECTED Final   Serratia marcescens NOT DETECTED NOT DETECTED Final   Carbapenem resistance NOT DETECTED NOT DETECTED Final   Haemophilus influenzae NOT DETECTED NOT DETECTED Final   Neisseria meningitidis NOT DETECTED NOT DETECTED Final   Pseudomonas aeruginosa NOT DETECTED NOT DETECTED Final   Candida albicans NOT DETECTED NOT DETECTED Final   Candida glabrata NOT DETECTED NOT DETECTED Final   Candida krusei NOT DETECTED NOT DETECTED Final   Candida parapsilosis NOT DETECTED NOT DETECTED Final   Candida tropicalis NOT DETECTED NOT DETECTED Final  Blood Culture (routine x 2)     Status: Abnormal   Collection Time: 05/27/16 12:20 AM  Result Value Ref Range Status   Specimen Description BLOOD RIGHT HAND  Final   Special Requests IN PEDIATRIC BOTTLE 1CC  Final   Culture  Setup Time   Final    GRAM NEGATIVE RODS AEROBIC BOTTLE ONLY CRITICAL RESULT CALLED TO, READ BACK BY AND VERIFIED WITH: N. Batchelder Pharm.D. 13:15 05/27/16  ([email or web address])    Culture (A)  Final    ESCHERICHIA COLI SUSCEPTIBILITIES PERFORMED ON PREVIOUS CULTURE WITHIN THE LAST 5 DAYS.    Report Status 05/29/2016 FINAL  Final  Urine culture     Status: Abnormal   Collection Time: 05/27/16  2:44 AM  Result Value Ref Range Status   Specimen Description URINE, CLEAN CATCH  Final   Special Requests NONE  Final   Culture >=100,000 COLONIES/mL ESCHERICHIA COLI (A)  Final   Report Status 05/29/2016 FINAL  Final  Organism ID, Bacteria ESCHERICHIA COLI (A)  Final      Susceptibility   Escherichia coli - MIC*    AMPICILLIN 4 SENSITIVE Sensitive     CEFAZOLIN <=4 SENSITIVE Sensitive     CEFTRIAXONE <=1 SENSITIVE Sensitive     CIPROFLOXACIN <=0.25 SENSITIVE Sensitive     GENTAMICIN <=1 SENSITIVE Sensitive     IMIPENEM <=0.25 SENSITIVE Sensitive     NITROFURANTOIN <=16 SENSITIVE Sensitive     TRIMETH/SULFA <=20 SENSITIVE Sensitive     AMPICILLIN/SULBACTAM  <=2 SENSITIVE Sensitive     PIP/TAZO <=4 SENSITIVE Sensitive     Extended ESBL NEGATIVE Sensitive     * >=100,000 COLONIES/mL ESCHERICHIA COLI  MRSA PCR Screening     Status: None   Collection Time: 05/27/16  3:03 PM  Result Value Ref Range Status   MRSA by PCR NEGATIVE NEGATIVE Final    Comment:        The GeneXpert MRSA Assay (FDA approved for NASAL specimens only), is one component of a comprehensive MRSA colonization surveillance program. It is not intended to diagnose MRSA infection nor to guide or monitor treatment for MRSA infections.          Radiology Studies: No results found.      Scheduled Meds: . calcitRIOL  0.25 mcg Oral Daily  . calcium acetate  1,334 mg Oral TID WC  . cefTRIAXone (ROCEPHIN)  IV  2 g Intravenous Q24H  . famotidine  20 mg Oral Daily  . heparin subcutaneous  5,000 Units Subcutaneous Q8H  . insulin aspart  0-20 Units Subcutaneous Q4H  . insulin aspart  8 Units Subcutaneous TID WC  . insulin glargine  14 Units Subcutaneous Q24H  . multivitamin  1 tablet Oral QHS   Continuous Infusions:    LOS: 4 days    Time spent: 40 minutes    Milan Perkins, Roselind Messier, MD [facility] Pager [phone]   If 7PM-7AM, please contact night-coverage [email or web address] Password [email or web address] 05/31/2016, 11:11 AM

## 2016-06-01 LAB — CBC
HEMATOCRIT: 28.3 % — AB (ref 39.0–52.0)
HEMOGLOBIN: 9.8 g/dL — AB (ref 13.0–17.0)
MCH: 30.1 pg (ref 26.0–34.0)
MCHC: 34.6 g/dL (ref 30.0–36.0)
MCV: 86.8 fL (ref 78.0–100.0)
Platelets: 190 10*3/uL (ref 150–400)
RBC: 3.26 MIL/uL — ABNORMAL LOW (ref 4.22–5.81)
RDW: 14.8 % (ref 11.5–15.5)
WBC: 10.3 10*3/uL (ref 4.0–10.5)

## 2016-06-01 LAB — RENAL FUNCTION PANEL
ALBUMIN: 1.9 g/dL — AB (ref 3.5–5.0)
Anion gap: 15 (ref 5–15)
BUN: 98 mg/dL — AB (ref 6–20)
CHLORIDE: 94 mmol/L — AB (ref 101–111)
CO2: 25 mmol/L (ref 22–32)
Calcium: 8.5 mg/dL — ABNORMAL LOW (ref 8.9–10.3)
Creatinine, Ser: 7.85 mg/dL — ABNORMAL HIGH (ref 0.61–1.24)
GFR calc Af Amer: 7 mL/min — ABNORMAL LOW (ref >60)
GFR calc non Af Amer: 6 mL/min — ABNORMAL LOW (ref >60)
GLUCOSE: 117 mg/dL — AB (ref 65–99)
POTASSIUM: 3.8 mmol/L (ref 3.5–5.1)
Phosphorus: 7.2 mg/dL — ABNORMAL HIGH (ref 2.5–4.6)
Sodium: 134 mmol/L — ABNORMAL LOW (ref 135–145)

## 2016-06-01 LAB — MAGNESIUM: Magnesium: 2.2 mg/dL (ref 1.7–2.4)

## 2016-06-01 LAB — GLUCOSE, CAPILLARY
GLUCOSE-CAPILLARY: 159 mg/dL — AB (ref 65–99)
GLUCOSE-CAPILLARY: 189 mg/dL — AB (ref 65–99)
GLUCOSE-CAPILLARY: 149 mg/dL — AB (ref 65–99)
Glucose-Capillary: 107 mg/dL — ABNORMAL HIGH (ref 65–99)
Glucose-Capillary: 132 mg/dL — ABNORMAL HIGH (ref 65–99)
Glucose-Capillary: 166 mg/dL — ABNORMAL HIGH (ref 65–99)

## 2016-06-01 LAB — CK: Total CK: 619 U/L — ABNORMAL HIGH (ref 49–397)

## 2016-06-01 MED ORDER — FAMOTIDINE 20 MG PO TABS
20.0000 mg | ORAL_TABLET | Freq: Two times a day (BID) | ORAL | Status: DC
  Administered 2016-06-01 – 2016-06-03 (×4): 20 mg via ORAL
  Filled 2016-06-01 (×4): qty 1

## 2016-06-01 NOTE — Progress Notes (Signed)
Subjective: Interval History: has no complaint, feels a lot better..  Objective: Vital signs in last 24 hours: Temp:  [97.6 F (36.4 C)-98.1 F (36.7 C)] 97.8 F (36.6 C) (12/03 0933) Pulse Rate:  [67-81] 78 (12/03 0933) Resp:  [12-24] 17 (12/03 0933) BP: (109-141)/(39-75) 129/54 (12/03 0933) SpO2:  [95 %-98 %] 98 % (12/03 0933) Weight:  [113.5 kg (250 lb 3.2 oz)-118.6 kg (261 lb 7.5 oz)] 113.5 kg (250 lb 3.6 oz) (12/03 0151) Weight change: 0.665 kg (1 lb 7.5 oz)  Intake/Output from previous day: 12/02 0701 - 12/03 0700 In: 540 [P.O.:540] Out: 2721 [Urine:720; Stool:1] Intake/Output this shift: Total I/O In: 120 [P.O.:120] Out: 250 [Urine:250]  General appearance: alert, cooperative, no distress and moderately obese Neck: RIJ cath Resp: clear to auscultation bilaterally Cardio: regularly irregular rhythm and systolic murmur: holosystolic 2/6, blowing at apex GI: obese, pos bs, soft Extremities: edema tr  Lab Results:  Recent Labs  05/30/16 0504 06/01/16 0605  WBC 8.9 10.3  HGB 10.6* 9.8*  HCT 30.5* 28.3*  PLT 147* 190   BMET:  Recent Labs  05/31/16 0456 06/01/16 0605  NA 132* 134*  K 4.7 3.8  CL 93* 94*  CO2 23 25  GLUCOSE 117* 117*  BUN 120* 98*  CREATININE 9.09* 7.85*  CALCIUM 8.2* 8.5*   No results for input(s): PTH in the last 72 hours. Iron Studies:  Recent Labs  05/30/16 1129  IRON 61  TIBC 168*    Studies/Results: No results found.  I have reviewed the patient's current medications.  Assessment/Plan: 1 CKD??level will get records from primary MD [employer] [place] 2 AKI less uremic and starting to make urine. HD yest Rhabdo/sepsis/ARB 3 DM controlled  4 HTN fair control 5 E Coli sepsis presumed Urinary 6 1 kidney P get records, follow chem     LOS: 5 days   Mindy Gali,Reakwon L 06/01/2016,11:16 AM

## 2016-06-01 NOTE — Progress Notes (Signed)
PROGRESS NOTE    Jonathan Waller  ZOX:[id number] DOB: [birth year] DOA: 05/26/2016 PCP: Pcp Not In System   Brief Narrative:  75 y.o. BM PMHx DM Type 2 uncontrolled with complication , HTN, Solitary Kidney, HLD,.    Patient is in town visiting family for thanksgiving.  2 days ago started to develop generalized weakness, SOB, productive cough, urinary urgency.  Today fell twice.  Was down for up to a couple of hours after first episode.  No LOC.  Fever 101 at home.  Has been having diarrhea today.  Intermittent confusion for past 2 days (at baseline lives at home alone).  Patient brought to ED.  ED Course: In ED patient is septic, has kidney failure with creat of 4.5 (presumably acute), mild transaminitis, CK elevated at 25k (patient has rhabdomyolysis).  Given empiric zosyn and vanc.  UA pending, CXR (see read) is basically radiologist hedging.  Trop os 0.13.  Lactate 3.34, BMP also shows NAG metabolic acidosis with bicarb of 16.   Subjective: 12/3  A/O 4 states ambulated hallway this a.m. with negative CP, negative DOE, positive fatigue. Patient is under complaint is GERD, states usually Zantac helps.    Assessment & Plan:   Principal Problem:   Sepsis (HCC) Active Problems:   IDDM (insulin dependent diabetes mellitus) (HCC)   HTN (hypertension)   AKI (acute kidney injury) (HCC)   Delirium   Rhabdomyolysis   Transaminitis   Diarrhea   Cholangitis   Cardiomyopathy (HCC)   Demand ischemia (HCC)   Bacteremia due to Escherichia coli   Urinary tract infection without hematuria   Acute systolic CHF (congestive heart failure) (HCC)   Pre-eclampsia superimposed on chronic hypertension, antepartum   Severe Sepsis /Positive Escherichia coli bacteremia and UTI -Complete 2 week course of antibiotic   Acute renal failure (unknown baseline) -Renal ultrasound shows S/P right nephrectomy, multiple left renal cysts. Lab Results  Component Value Date   CREATININE 7.85 (H) 06/01/2016     CREATININE 9.09 (H) 05/31/2016   CREATININE 7.49 (H) 05/30/2016  -Hold all nephrotoxic medication -Continue HD per Nephrology Dr. Fayrene Fearing Deterding   Hyperkalemia -Control with HD  Demand ischemia vs MI/Acute Systolic CHF -Patient with abnormal EKG, positive troponins R/O MI vs demand ischemia. -Echocardiogram: Systolic CHF and pulmonary hypertension see results below -Strict in and out since admission - 4.8 L -Daily weight Filed Weights   05/31/16 1434 05/31/16 2235 06/01/16 0151  Weight: 116.7 kg (257 lb 4.4 oz) 113.5 kg (250 lb 3.2 oz) 113.5 kg (250 lb 3.6 oz)  -Cardiology: Further assessment once recovered from acute illness: Will need catheterization. -12/2 Coreg DC to facilitate HD since patient's BP soft  Pulmonary hypertension -See demand ischemia  Rhabdomyolysis -Unknown cause: Possibly secondary to patient laying in bed most of the weekend. -CK continues to trend down though remains elevated.  Transaminitis -Resolving with resolution of Rhabdomyolysis  Acalculus Cholecystitis. -Most likely was incidental finding, as liver enzymes are trending down with resolution of rhabdo.  Diabetes type 2 uncontrolled with complication -11/28 Hemoglobin A1c= 10 -Lantus 14 units daily -Start NovoLog 8 units QAC -Resistant SSI  Delirium -Most likely secondary to sepsis: Resolved     DVT prophylaxis: Subcutaneous heparin Code Status: Full Family Communication: None Disposition Plan: Per nephrology and cardiology     Consultants:  Cardiology  Nephrology     Procedures/Significant Events:  11/28 Renal ultrasound:-S/P Right nephrectomy. -  Multiple left renal cysts, 11/28 Echocardiogram:- LVEF=40%. akinesis of the mid anteroseptal and apical septal myocardium.  There is akinesis of the entireinferior and apical myocardium. There is akinesis of the apicalanterior myocardium.   - Pulmonary arteries: PA peak pressure: 41 mm Hg (S). 11/28 RUQ abdominal ultrasound:  -Gallbladder concerning for acalculus cholecystitis. -Cystic area containing minimal septations in the anterior segment right lobe of the liver. Appears benign. -No biliary duct dilatation evident.    VENTILATOR SETTINGS:    Cultures 11/27 blood 2 Positive Escherichia coli 11/28 urine positive for Escherichia coli   Antimicrobials: Ceftaz 11/30>> Zosyn 11/27>>11/30 Vancomycin 11/27>> 11/28    Devices None   LINES / TUBES:      Continuous Infusions:    Objective: Vitals:   05/31/16 2235 06/01/16 0151 06/01/16 0444 06/01/16 0933  BP: (!) 113/39  (!) 117/56 (!) 129/54  Pulse: 76  72 78  Resp: 20  18 17   Temp: 98 F (36.7 C)  97.7 F (36.5 C) 97.8 F (36.6 C)  TempSrc: Oral  Oral Oral  SpO2: 95%  95% 98%  Weight: 113.5 kg (250 lb 3.2 oz) 113.5 kg (250 lb 3.6 oz)    Height:        Intake/Output Summary (Last 24 hours) at 06/01/16 1041 Last data filed at 06/01/16 0600  Gross per 24 hour  Intake              300 ml  Output             2421 ml  Net            -2121 ml   Filed Weights   05/31/16 1434 05/31/16 2235 06/01/16 0151  Weight: 116.7 kg (257 lb 4.4 oz) 113.5 kg (250 lb 3.2 oz) 113.5 kg (250 lb 3.6 oz)    Examination:  General: A/O 4, No acute respiratory distress Eyes: negative scleral hemorrhage, negative anisocoria, negative icterus ENT: Negative Runny nose, negative gingival bleeding, Neck:  Negative scars, masses, torticollis, lymphadenopathy, JVD Lungs: Clear to auscultation bilaterally without wheezes or crackles Cardiovascular: Regular rate and rhythm without murmur gallop or rub normal S1 and S2 Abdomen: negative abdominal pain, positive distention, positive soft, bowel sounds, no rebound, no ascites, no appreciable mass Extremities: No significant cyanosis, clubbing, or edema bilateral lower extremities Skin: Negative rashes, lesions, ulcers Psychiatric:  Negative depression, negative anxiety, negative fatigue, negative mania    Central nervous system:  Cranial nerves II through XII intact, tongue/uvula midline, all extremities muscle strength 5/5, sensation intact throughout,negative dysarthria, negative expressive aphasia, negative receptive aphasia.  .     Data Reviewed: Care during the described time interval was provided by me .  I have reviewed this patient's available data, including medical history, events of note, physical examination, and all test results as part of my evaluation. I have personally reviewed and interpreted all radiology studies.  CBC:  Recent Labs Lab 05/26/16 2305 05/27/16 0259 05/28/16 0634 05/29/16 1127 05/30/16 0504 06/01/16 0605  WBC 10.3 6.9 15.8* 11.5* 8.9 10.3  NEUTROABS 8.8*  --  13.6*  --   --   --   HGB 11.2* 11.7* 11.0* 10.2* 10.6* 9.8*  HCT 32.6* 33.7* 31.2* 29.1* 30.5* 28.3*  MCV 89.1 88.5 87.2 86.6 86.9 86.8  PLT 95* 89* 97* 124* 147* 190   Basic Metabolic Panel:  Recent Labs Lab 05/28/16 0634 05/29/16 0239 05/30/16 0504 05/31/16 0456 06/01/16 0605  NA 136  137 134* 132* 132* 134*  K 4.6  4.6 4.5 4.1 4.7 3.8  CL 98*  99* 95* 94* 93* 94*  CO2 23  24 24 22 23 25   GLUCOSE 198*  199* 199* 146* 117* 117*  BUN 75*  74* 70* 88* 120* 98*  CREATININE 8.65*  8.49* 7.54* 7.49* 9.09* 7.85*  CALCIUM 8.5*  8.5* 8.2* 8.3* 8.2* 8.5*  MG 2.0 2.1 2.3 2.6* 2.2  PHOS 3.2  3.3 4.5 4.9* 7.1* 7.2*   GFR: Estimated Creatinine Clearance: 10.9 mL/min (by C-G formula based on SCr of 7.85 mg/dL (H)). Liver Function Tests:  Recent Labs Lab 05/26/16 2305 05/27/16 0259  05/28/16 7829 05/29/16 0239 05/30/16 0504 05/31/16 0456 06/01/16 0605  AST 310* 355*  --  376*  --  160* 136*  --   ALT 101* 113*  --  148*  --  120* 122*  --   ALKPHOS 110 118  --  102  --  110 120  --   BILITOT 1.2 1.6*  --  1.2  --  0.8 2.0*  --   PROT 6.1* 6.6  --  6.8  --  6.5 5.9*  --   ALBUMIN 2.8* 2.8*  < > 2.5*  2.4* 2.1* 1.9* 1.9* 1.9*  < > = values in this interval not  displayed.  Recent Labs Lab 05/27/16 0853  LIPASE 20   No results for input(s): AMMONIA in the last 168 hours. Coagulation Profile: No results for input(s): INR, PROTIME in the last 168 hours. Cardiac Enzymes:  Recent Labs Lab 05/26/16 2305 05/27/16 0025 05/27/16 0259 05/27/16 1212 05/27/16 1852 05/27/16 2229 05/28/16 5621 05/29/16 0239 05/30/16 0504 06/01/16 3086  CKTOTAL 57,846*  --  96,295* 28,316* 25,261* 22,886* 19,483* 6,520* 2,989* 619*  TROPONINI 0.13* 0.15* 0.48* 0.18* 0.12*  --   --   --   --   --    BNP (last 3 results) No results for input(s): PROBNP in the last 8760 hours. HbA1C: No results for input(s): HGBA1C in the last 72 hours. CBG:  Recent Labs Lab 05/31/16 1604 05/31/16 2000 06/01/16 0018 06/01/16 0356 06/01/16 0750  GLUCAP 158* 206* 166* 132* 107*   Lipid Profile: No results for input(s): CHOL, HDL, LDLCALC, TRIG, CHOLHDL, LDLDIRECT in the last 72 hours. Thyroid Function Tests: No results for input(s): TSH, T4TOTAL, FREET4, T3FREE, THYROIDAB in the last 72 hours. Anemia Panel:  Recent Labs  05/30/16 1129  TIBC 168*  IRON 61   Urine analysis:    Component Value Date/Time   COLORURINE AMBER (A) 05/27/2016 0244   APPEARANCEUR TURBID (A) 05/27/2016 0244   LABSPEC 1.021 05/27/2016 0244   PHURINE 6.5 05/27/2016 0244   GLUCOSEU 250 (A) 05/27/2016 0244   HGBUR LARGE (A) 05/27/2016 0244   BILIRUBINUR SMALL (A) 05/27/2016 0244   KETONESUR 15 (A) 05/27/2016 0244   PROTEINUR >300 (A) 05/27/2016 0244   NITRITE NEGATIVE 05/27/2016 0244   LEUKOCYTESUR LARGE (A) 05/27/2016 0244   Sepsis Labs: @LABRCNTIP (procalcitonin:4,lacticidven:4)  ) Recent Results (from the past 240 hour(s))  Blood Culture (routine x 2)     Status: Abnormal   Collection Time: 05/26/16 11:55 PM  Result Value Ref Range Status   Specimen Description BLOOD LEFT ANTECUBITAL  Final   Special Requests BOTTLES DRAWN AEROBIC AND ANAEROBIC 5CC  Final   Culture  Setup Time    Final    GRAM NEGATIVE RODS IN BOTH AEROBIC AND ANAEROBIC BOTTLES Organism ID to follow CRITICAL RESULT CALLED TO, READ BACK BY AND VERIFIED WITH: N. Batchelder Pharm.D. 13:15 05/27/16  ([email or web address])    Culture ESCHERICHIA COLI (A)  Final   Report Status 05/29/2016 FINAL  Final   Organism ID, Bacteria ESCHERICHIA COLI  Final      Susceptibility   Escherichia coli - MIC*    AMPICILLIN <=2 SENSITIVE Sensitive     CEFAZOLIN <=4 SENSITIVE Sensitive     CEFEPIME <=1 SENSITIVE Sensitive     CEFTAZIDIME <=1 SENSITIVE Sensitive     CEFTRIAXONE <=1 SENSITIVE Sensitive     CIPROFLOXACIN <=0.25 SENSITIVE Sensitive     GENTAMICIN <=1 SENSITIVE Sensitive     IMIPENEM <=0.25 SENSITIVE Sensitive     TRIMETH/SULFA <=20 SENSITIVE Sensitive     AMPICILLIN/SULBACTAM <=2 SENSITIVE Sensitive     PIP/TAZO <=4 SENSITIVE Sensitive     Extended ESBL NEGATIVE Sensitive     * ESCHERICHIA COLI  Blood Culture ID Panel (Reflexed)     Status: Abnormal   Collection Time: 05/26/16 11:55 PM  Result Value Ref Range Status   Enterococcus species NOT DETECTED NOT DETECTED Final   Listeria monocytogenes NOT DETECTED NOT DETECTED Final   Staphylococcus species NOT DETECTED NOT DETECTED Final   Staphylococcus aureus NOT DETECTED NOT DETECTED Final   Streptococcus species NOT DETECTED NOT DETECTED Final   Streptococcus agalactiae NOT DETECTED NOT DETECTED Final   Streptococcus pneumoniae NOT DETECTED NOT DETECTED Final   Streptococcus pyogenes NOT DETECTED NOT DETECTED Final   Acinetobacter baumannii NOT DETECTED NOT DETECTED Final   Enterobacteriaceae species DETECTED (A) NOT DETECTED Final    Comment: CRITICAL RESULT CALLED TO, READ BACK BY AND VERIFIED WITH: N. Batchelder Pharm.D. 13:15 05/27/16 ([email or web address])    Enterobacter cloacae complex NOT DETECTED NOT DETECTED Final   Escherichia coli DETECTED (A) NOT DETECTED Final    Comment: CRITICAL RESULT CALLED TO, READ BACK BY AND VERIFIED WITH: N. Batchelder Pharm.D.  13:15 05/27/16 ([email or web address])    Klebsiella oxytoca NOT DETECTED NOT DETECTED Final   Klebsiella pneumoniae NOT DETECTED NOT DETECTED Final   Proteus species NOT DETECTED NOT DETECTED Final   Serratia marcescens NOT DETECTED NOT DETECTED Final   Carbapenem resistance NOT DETECTED NOT DETECTED Final   Haemophilus influenzae NOT DETECTED NOT DETECTED Final   Neisseria meningitidis NOT DETECTED NOT DETECTED Final   Pseudomonas aeruginosa NOT DETECTED NOT DETECTED Final   Candida albicans NOT DETECTED NOT DETECTED Final   Candida glabrata NOT DETECTED NOT DETECTED Final   Candida krusei NOT DETECTED NOT DETECTED Final   Candida parapsilosis NOT DETECTED NOT DETECTED Final   Candida tropicalis NOT DETECTED NOT DETECTED Final  Blood Culture (routine x 2)     Status: Abnormal   Collection Time: 05/27/16 12:20 AM  Result Value Ref Range Status   Specimen Description BLOOD RIGHT HAND  Final   Special Requests IN PEDIATRIC BOTTLE 1CC  Final   Culture  Setup Time   Final    GRAM NEGATIVE RODS AEROBIC BOTTLE ONLY CRITICAL RESULT CALLED TO, READ BACK BY AND VERIFIED WITH: N. Batchelder Pharm.D. 13:15 05/27/16  ([email or web address])    Culture (A)  Final    ESCHERICHIA COLI SUSCEPTIBILITIES PERFORMED ON PREVIOUS CULTURE WITHIN THE LAST 5 DAYS.    Report Status 05/29/2016 FINAL  Final  Urine culture     Status: Abnormal   Collection Time: 05/27/16  2:44 AM  Result Value Ref Range Status   Specimen Description URINE, CLEAN CATCH  Final   Special Requests NONE  Final   Culture >=100,000 COLONIES/mL ESCHERICHIA COLI (A)  Final   Report Status 05/29/2016 FINAL  Final   Organism ID, Bacteria ESCHERICHIA COLI (A)  Final  Susceptibility   Escherichia coli - MIC*    AMPICILLIN 4 SENSITIVE Sensitive     CEFAZOLIN <=4 SENSITIVE Sensitive     CEFTRIAXONE <=1 SENSITIVE Sensitive     CIPROFLOXACIN <=0.25 SENSITIVE Sensitive     GENTAMICIN <=1 SENSITIVE Sensitive     IMIPENEM <=0.25 SENSITIVE Sensitive      NITROFURANTOIN <=16 SENSITIVE Sensitive     TRIMETH/SULFA <=20 SENSITIVE Sensitive     AMPICILLIN/SULBACTAM <=2 SENSITIVE Sensitive     PIP/TAZO <=4 SENSITIVE Sensitive     Extended ESBL NEGATIVE Sensitive     * >=100,000 COLONIES/mL ESCHERICHIA COLI  MRSA PCR Screening     Status: None   Collection Time: 05/27/16  3:03 PM  Result Value Ref Range Status   MRSA by PCR NEGATIVE NEGATIVE Final    Comment:        The GeneXpert MRSA Assay (FDA approved for NASAL specimens only), is one component of a comprehensive MRSA colonization surveillance program. It is not intended to diagnose MRSA infection nor to guide or monitor treatment for MRSA infections.          Radiology Studies: No results found.      Scheduled Meds: . calcitRIOL  0.25 mcg Oral Daily  . calcium acetate  1,334 mg Oral TID WC  . cefTRIAXone (ROCEPHIN)  IV  2 g Intravenous Q24H  . famotidine  20 mg Oral Daily  . heparin subcutaneous  5,000 Units Subcutaneous Q8H  . insulin aspart  0-20 Units Subcutaneous Q4H  . insulin aspart  8 Units Subcutaneous TID WC  . insulin glargine  14 Units Subcutaneous Q24H  . multivitamin  1 tablet Oral QHS   Continuous Infusions:    LOS: 5 days    Time spent: 40 minutes    Idamae Coccia, Roselind Messier, MD [facility] Pager [phone]   If 7PM-7AM, please contact night-coverage [email or web address] Password [email or web address] 06/01/2016, 10:41 AM

## 2016-06-02 ENCOUNTER — Inpatient Hospital Stay (HOSPITAL_COMMUNITY): Payer: Medicare Other

## 2016-06-02 DIAGNOSIS — I369 Nonrheumatic tricuspid valve disorder, unspecified: Secondary | ICD-10-CM

## 2016-06-02 DIAGNOSIS — I10 Essential (primary) hypertension: Secondary | ICD-10-CM

## 2016-06-02 LAB — ECHOCARDIOGRAM LIMITED
CHL CUP TV REG PEAK VELOCITY: 262 cm/s
E decel time: 264 msec
E/e' ratio: 5.54
FS: 40 % (ref 28–44)
HEIGHTINCHES: 74 in
IV/PV OW: 0.95
LA ID, A-P, ES: 36 mm
LADIAMINDEX: 1.42 cm/m2
LDCA: 3.8 cm2
LEFT ATRIUM END SYS DIAM: 36 mm
LV E/e'average: 5.54
LV e' LATERAL: 11.3 cm/s
LVEEMED: 5.54
LVOT diameter: 22 mm
MV Dec: 264
MV pk A vel: 103 m/s
MV pk E vel: 62.6 m/s
P 1/2 time: 590 ms
PW: 11.4 mm — AB (ref 0.6–1.1)
TDI e' lateral: 11.3
TDI e' medial: 6.53
TRMAXVEL: 262 cm/s
WEIGHTICAEL: 4229.3 [oz_av]

## 2016-06-02 LAB — GLUCOSE, CAPILLARY
GLUCOSE-CAPILLARY: 107 mg/dL — AB (ref 65–99)
GLUCOSE-CAPILLARY: 190 mg/dL — AB (ref 65–99)
GLUCOSE-CAPILLARY: 203 mg/dL — AB (ref 65–99)
Glucose-Capillary: 124 mg/dL — ABNORMAL HIGH (ref 65–99)
Glucose-Capillary: 126 mg/dL — ABNORMAL HIGH (ref 65–99)
Glucose-Capillary: 180 mg/dL — ABNORMAL HIGH (ref 65–99)

## 2016-06-02 LAB — CK: CK TOTAL: 484 U/L — AB (ref 49–397)

## 2016-06-02 LAB — COMPREHENSIVE METABOLIC PANEL
ALK PHOS: 110 U/L (ref 38–126)
ALT: 117 U/L — ABNORMAL HIGH (ref 17–63)
ANION GAP: 20 — AB (ref 5–15)
AST: 104 U/L — ABNORMAL HIGH (ref 15–41)
Albumin: 2.2 g/dL — ABNORMAL LOW (ref 3.5–5.0)
BUN: 119 mg/dL — ABNORMAL HIGH (ref 6–20)
CALCIUM: 8.7 mg/dL — AB (ref 8.9–10.3)
CHLORIDE: 92 mmol/L — AB (ref 101–111)
CO2: 21 mmol/L — AB (ref 22–32)
Creatinine, Ser: 8.86 mg/dL — ABNORMAL HIGH (ref 0.61–1.24)
GFR calc non Af Amer: 5 mL/min — ABNORMAL LOW (ref >60)
GFR, EST AFRICAN AMERICAN: 6 mL/min — AB (ref >60)
Glucose, Bld: 129 mg/dL — ABNORMAL HIGH (ref 65–99)
Potassium: 4.1 mmol/L (ref 3.5–5.1)
SODIUM: 133 mmol/L — AB (ref 135–145)
Total Bilirubin: 0.5 mg/dL (ref 0.3–1.2)
Total Protein: 6.4 g/dL — ABNORMAL LOW (ref 6.5–8.1)

## 2016-06-02 LAB — MAGNESIUM: Magnesium: 2.2 mg/dL (ref 1.7–2.4)

## 2016-06-02 LAB — PHOSPHORUS: Phosphorus: 7.9 mg/dL — ABNORMAL HIGH (ref 2.5–4.6)

## 2016-06-02 NOTE — Progress Notes (Signed)
  Echocardiogram 2D Echocardiogram has been performed.  Delcie Roch 06/02/2016, 5:15 PM

## 2016-06-02 NOTE — Progress Notes (Signed)
Patient ID: Jonathan Waller, male   DOB: 03/12/1941, 75 y.o.   MRN: [id number] S: No complaints O:BP 124/61 (BP Location: Right Arm)   Pulse 71   Temp 98 F (36.7 C) (Oral)   Resp 17   Ht 6\' 2"  (1.88 m)   Wt 119.9 kg (264 lb 5.3 oz)   SpO2 99%   BMI 33.94 kg/m   Intake/Output Summary (Last 24 hours) at 06/02/16 1145 Last data filed at 06/02/16 0930  Gross per 24 hour  Intake              460 ml  Output             1595 ml  Net            -1135 ml   Intake/Output: I/O last 3 completed shifts: In: 540 [P.O.:540] Out: 1815 [Urine:1815]  Intake/Output this shift:  Total I/O In: 220 [P.O.:220] Out: 450 [Urine:450] Weight change: 1.3 kg (2 lb 13.9 oz) Gen:WD obese AAM in NAD CVS:no rub Resp:cta Abd: obese, +BS, soft, NT Ext: no edema   Recent Labs Lab 05/26/16 2305 05/27/16 0259  05/27/16 2229 05/28/16 0634 05/29/16 0239 05/30/16 0504 05/31/16 0456 06/01/16 0605 06/02/16 0636  NA 126* 128*  < > 134* 136  137 134* 132* 132* 134* 133*  K 4.9 5.2*  < > 4.9  4.9 4.6  4.6 4.5 4.1 4.7 3.8 4.1  CL 98* 97*  < > 100* 98*  99* 95* 94* 93* 94* 92*  CO2 16* 18*  < > 21* 23  24 24 22 23 25  21*  GLUCOSE 347* 348*  < > 249* 198*  199* 199* 146* 117* 117* 129*  BUN 50* 55*  < > 70* 75*  74* 70* 88* 120* 98* 119*  CREATININE 4.78* 5.96*  < > 7.53* 8.65*  8.49* 7.54* 7.49* 9.09* 7.85* 8.86*  ALBUMIN 2.8* 2.8*  < > 2.4* 2.5*  2.4* 2.1* 1.9* 1.9* 1.9* 2.2*  CALCIUM 8.6* 8.8*  < > 8.4* 8.5*  8.5* 8.2* 8.3* 8.2* 8.5* 8.7*  PHOS  --   --   < > 3.1 3.2  3.3 4.5 4.9* 7.1* 7.2* 7.9*  AST 310* 355*  --   --  376*  --  160* 136*  --  104*  ALT 101* 113*  --   --  148*  --  120* 122*  --  117*  < > = values in this interval not displayed. Liver Function Tests:  Recent Labs Lab 05/30/16 0504 05/31/16 0456 06/01/16 0605 06/02/16 0636  AST 160* 136*  --  104*  ALT 120* 122*  --  117*  ALKPHOS 110 120  --  110  BILITOT 0.8 2.0*  --  0.5  PROT 6.5 5.9*  --  6.4*  ALBUMIN  1.9* 1.9* 1.9* 2.2*    Recent Labs Lab 05/27/16 0853  LIPASE 20   No results for input(s): AMMONIA in the last 168 hours. CBC:  Recent Labs Lab 05/26/16 2305 05/27/16 0259 05/28/16 0634 05/29/16 1127 05/30/16 0504 06/01/16 0605  WBC 10.3 6.9 15.8* 11.5* 8.9 10.3  NEUTROABS 8.8*  --  13.6*  --   --   --   HGB 11.2* 11.7* 11.0* 10.2* 10.6* 9.8*  HCT 32.6* 33.7* 31.2* 29.1* 30.5* 28.3*  MCV 89.1 88.5 87.2 86.6 86.9 86.8  PLT 95* 89* 97* 124* 147* 190   Cardiac Enzymes:  Recent Labs Lab 05/26/16 2305 05/27/16 0025 05/27/16 0259 05/27/16 1212 05/27/16  1852  05/28/16 0634 05/29/16 0239 05/30/16 0504 06/01/16 0605 06/02/16 0636  CKTOTAL 25,775*  --  73,403* 28,316* 25,261*  < > 19,483* 6,520* 2,989* 619* 484*  TROPONINI 0.13* 0.15* 0.48* 0.18* 0.12*  --   --   --   --   --   --   < > = values in this interval not displayed. CBG:  Recent Labs Lab 06/01/16 1615 06/01/16 1956 06/02/16 0004 06/02/16 0501 06/02/16 0747  GLUCAP 189* 149* 107* 124* 126*    Iron Studies: No results for input(s): IRON, TIBC, TRANSFERRIN, FERRITIN in the last 72 hours. Studies/Results: No results found. . calcitRIOL  0.25 mcg Oral Daily  . calcium acetate  1,334 mg Oral TID WC  . cefTRIAXone (ROCEPHIN)  IV  2 g Intravenous Q24H  . famotidine  20 mg Oral BID  . heparin subcutaneous  5,000 Units Subcutaneous Q8H  . insulin aspart  0-20 Units Subcutaneous Q4H  . insulin aspart  8 Units Subcutaneous TID WC  . insulin glargine  14 Units Subcutaneous Q24H  . multivitamin  1 tablet Oral QHS    BMET    Component Value Date/Time   NA 133 (L) 06/02/2016 0636   K 4.1 06/02/2016 0636   CL 92 (L) 06/02/2016 0636   CO2 21 (L) 06/02/2016 0636   GLUCOSE 129 (H) 06/02/2016 0636   BUN 119 (H) 06/02/2016 0636   CREATININE 8.86 (H) 06/02/2016 0636   CALCIUM 8.7 (L) 06/02/2016 0636   GFRNONAA 5 (L) 06/02/2016 0636   GFRAA 6 (L) 06/02/2016 0636   CBC    Component Value Date/Time   WBC  10.3 06/01/2016 0605   RBC 3.26 (L) 06/01/2016 0605   HGB 9.8 (L) 06/01/2016 0605   HCT 28.3 (L) 06/01/2016 0605   PLT 190 06/01/2016 0605   MCV 86.8 06/01/2016 0605   MCH 30.1 06/01/2016 0605   MCHC 34.6 06/01/2016 0605   RDW 14.8 06/01/2016 0605   LYMPHSABS 0.5 (L) 05/28/2016 0634   MONOABS 1.8 (H) 05/28/2016 0634   EOSABS 0.0 05/28/2016 0634   BASOSABS 0.0 05/28/2016 0634     Assessment/Plan:  1. 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  Increased UOP.  Last HD session 05/31/16.  Will hold off on HD for now and follow.  Hopefully he is regaining some function 2. E coli sepsis 3. DM- per primary 4. Anemia-  5. Thrombocytopenia- improving.  6. Abnormal LFT's- ?shock liver. Improving 7. acalculus cholecystitis-   [name]

## 2016-06-02 NOTE — Progress Notes (Signed)
Physical Therapy Treatment Patient Details Name: Jonathan Waller MRN: [id number] DOB: August 22, 1940 Today's Date: 06/02/2016    History of Present Illness  Jonathan Waller is a 75 y.o. male with medical history significant of IDDM, HTN, solitary kidney.  Patient is in town visiting family for thanksgiving.  Patient was in usual state of health until 2 days ago when he started to develop generalized weakness, SOB, productive cough, urinary urgency.  Today fell twice.  Was down for up to a couple of hours after first episode.  No LOC.  Fever 101 at home.  Has been having diarrhea today.  Intermittent confusion for past 2 days (at baseline lives at home alone).  Patient brought to ED.    PT Comments    Pt progressing well; discussed option of amb with cane to improve stability; will continue to follow in acute setting; activity tol improved although pt fatigued after amb and ex's; VSS  Follow Up Recommendations  No PT follow up     Equipment Recommendations  Cane (may benefit from single point cane if pt  agrees)    Recommendations for Other Services       Precautions / Restrictions Precautions Precautions: Fall Restrictions Weight Bearing Restrictions: No    Mobility  Bed Mobility Overal bed mobility: Modified Independent                Transfers Overall transfer level: Needs assistance Equipment used: None Transfers: Sit to/from Stand Sit to Stand: Supervision;Min guard         General transfer comment: for safety; repeated sit<>stand x6 without use of UEs for strenghtening/activity tol  Ambulation/Gait Ambulation/Gait assistance: Min guard Ambulation Distance (Feet): 140 Feet Assistive device: 1 person hand held assist;None (rail at times) Gait Pattern/deviations: Step-through pattern;Decreased stride length;Drifts right/left     General Gait Details: mildly unsteady benefiting from the rail touch or HHA; no overt deviations, decr push off bilaterally adn decr  toe clearance on R; stability improved with distance   Stairs            Wheelchair Mobility    Modified Rankin (Stroke Patients Only)       Balance           Standing balance support: During functional activity;Single extremity supported Standing balance-Leahy Scale: Fair                      Cognition Arousal/Alertness: Awake/alert Behavior During Therapy: WFL for tasks assessed/performed Overall Cognitive Status: Within Functional Limits for tasks assessed                      Exercises General Exercises - Lower Extremity Hip Flexion/Marching: AROM;Strengthening;Both;10 reps;Standing Heel Raises: AROM;Strengthening;Both;15 reps;Standing    General Comments        Pertinent Vitals/Pain Pain Assessment: Faces Faces Pain Scale: Hurts a little bit Pain Location: "all over" Pain Descriptors / Indicators: Discomfort Pain Intervention(s): Monitored during session    Home Living                      Prior Function            PT Goals (current goals can now be found in the care plan section) Acute Rehab PT Goals Patient Stated Goal: get home as soon as I can PT Goal Formulation: With patient Time For Goal Achievement: 06/06/16 Potential to Achieve Goals: Good Progress towards PT goals: Progressing toward goals    Frequency  Min 3X/week      PT Plan Current plan remains appropriate    Co-evaluation             End of Session Equipment Utilized During Treatment: Gait belt Activity Tolerance: Patient tolerated treatment well Patient left: in bed;with call Waller/phone within reach;with family/visitor present     Time: 1120-1145 PT Time Calculation (min) (ACUTE ONLY): 25 min  Charges:  $Gait Training: 23-37 mins                    G Codes:      Jonathan Waller 06/02/2016, 12:51 PM

## 2016-06-02 NOTE — Progress Notes (Deleted)
PT Cancellation Note  Patient Details Name: Jonathan Waller MRN: [id number] DOB: 1941-03-01   Cancelled Treatment:   Note deleted [facility] 06/02/2016, 12:54 PM

## 2016-06-02 NOTE — Progress Notes (Signed)
[place] TEAM 1 - Stepdown/ICU Neomia GlassEAM  Finlay Ringstad  NWG:[id number] DOB: [birth year] DOA: 05/26/2016 PCP: Pcp Not In System    Brief Narrative:  75 y.o.M Hx DM2, HTN, Solitary Kidney, HLD, in town visiting family when he developed generalized weakness, SOB, productive cough, and urinary urgency. He fell twice, and was down for up to a couple of hours after first episode. No LOC. Fever 101 at home w/ diarrhea and intermittent confusion for 2 days.  In the ED the pt was found to have kidney failure with creat of 4.5, mild transaminitis, and CK 25K.   Subjective: The patient is resting comfortably in bed.  He denies chest pain shortness breath fevers chills nausea or vomiting.  He reports his appetite is improving.  Assessment & Plan:  Severe Sepsis due to Escherichia coli UTI w/ bacteremia -to complete a 2 week course of antibiotic tx   Acute renal failure - unknown baseline -Renal US noted S/P right nephrectomy, multiple left renal cysts -Holding all nephrotoxic medication -HD per Nephrology - holding for now w/ improving UOP   Hyperkalemia -Controlled with HD  Demand ischemia vs MI  -Patient with abnormal EKG, positive troponins R/O MI vs demand ischemia - Cards following, but no plans for angio at this time given ARF  Acute Systolic CHF -Echocardiogram: Systolic CHF and pulmonary hypertension - volume control per HD Filed Weights   05/31/16 2235 06/01/16 0151 06/01/16 2151  Weight: 113.5 kg (250 lb 3.2 oz) 113.5 kg (250 lb 3.6 oz) 119.9 kg (264 lb 5.3 oz)    Pulmonary hypertension  Rhabdomyolysis -secondary to patient laying in bed most of the weekend / falling - now essentially resolved   Transaminitis -Resolving  Acalculus Cholecystitis -Most likely an incidental finding  DM2 -CBG currently reasonably controlled - no change in treatment plan today  Acute Delirium -Most likely secondary to sepsis: Resolved  DVT prophylaxis: SQ heparin  Code  Status: FULL CODE Family Communication: no family present at time of exam  Disposition Plan:   Consultants:  Nephrology  Cardiology   Procedures: 11/28 Renal US - S/P Right nephrectomy. - Multiple left renal cysts, 11/28 TTE - EF 40%. akinesis of the mid anteroseptal and apical septal myocardium. There is akinesis of the entireinferior and apical myocardium. There is akinesis of the apicalanterior myocardium.   - Pulmonary arteries: PA peak pressure: 41 mm Hg (S). 11/28 RUQ abdominal US - Gallbladder concerning for acalculus cholecystitis. -Cystic area containing minimal septations in the anterior segment right lobe of the liver. Appears benign. -No biliary duct dilatation evident.  Antimicrobials:  Ceftriaxone 11/30 > Zosyn 11/27 > 11/30 Vancomycin 11/27 > 11/28  Objective: Blood pressure 124/61, pulse 71, temperature 98 F (36.7 C), temperature source Oral, resp. rate 17, height 6\' 2"  (1.88 m), weight 119.9 kg (264 lb 5.3 oz), SpO2 99 %.  Intake/Output Summary (Last 24 hours) at 06/02/16 1611 Last data filed at 06/02/16 1330  Gross per 24 hour  Intake              460 ml  Output             1620 ml  Net            -1160 ml   Filed Weights   05/31/16 2235 06/01/16 0151 06/01/16 2151  Weight: 113.5 kg (250 lb 3.2 oz) 113.5 kg (250 lb 3.6 oz) 119.9 kg (264 lb 5.3 oz)    Examination: General: No acute respiratory distress Lungs: Clear to  auscultation bilaterally without wheezes or crackles Cardiovascular: Regular rate and rhythm without murmur gallop or rub normal S1 and S2 Abdomen: Nontender, nondistended, soft, bowel sounds positive, no rebound, no ascites, no appreciable mass Extremities: No significant cyanosis, clubbing, or edema bilateral lower extremities  CBC:  Recent Labs Lab 05/26/16 2305 05/27/16 0259 05/28/16 0634 05/29/16 1127 05/30/16 0504 06/01/16 0605  WBC 10.3 6.9 15.8* 11.5* 8.9 10.3  NEUTROABS 8.8*  --  13.6*  --   --   --   HGB 11.2* 11.7*  11.0* 10.2* 10.6* 9.8*  HCT 32.6* 33.7* 31.2* 29.1* 30.5* 28.3*  MCV 89.1 88.5 87.2 86.6 86.9 86.8  PLT 95* 89* 97* 124* 147* 190   Basic Metabolic Panel:  Recent Labs Lab 05/29/16 0239 05/30/16 0504 05/31/16 0456 06/01/16 0605 06/02/16 0636  NA 134* 132* 132* 134* 133*  K 4.5 4.1 4.7 3.8 4.1  CL 95* 94* 93* 94* 92*  CO2 24 22 23 25  21*  GLUCOSE 199* 146* 117* 117* 129*  BUN 70* 88* 120* 98* 119*  CREATININE 7.54* 7.49* 9.09* 7.85* 8.86*  CALCIUM 8.2* 8.3* 8.2* 8.5* 8.7*  MG 2.1 2.3 2.6* 2.2 2.2  PHOS 4.5 4.9* 7.1* 7.2* 7.9*   GFR: Estimated Creatinine Clearance: 9.9 mL/min (by C-G formula based on SCr of 8.86 mg/dL (H)).  Liver Function Tests:  Recent Labs Lab 05/27/16 0259  05/28/16 0634 05/29/16 0239 05/30/16 0504 05/31/16 0456 06/01/16 0605 06/02/16 0636  AST 355*  --  376*  --  160* 136*  --  104*  ALT 113*  --  148*  --  120* 122*  --  117*  ALKPHOS 118  --  102  --  110 120  --  110  BILITOT 1.6*  --  1.2  --  0.8 2.0*  --  0.5  PROT 6.6  --  6.8  --  6.5 5.9*  --  6.4*  ALBUMIN 2.8*  < > 2.5*  2.4* 2.1* 1.9* 1.9* 1.9* 2.2*  < > = values in this interval not displayed.  Recent Labs Lab 05/27/16 0853  LIPASE 20   Cardiac Enzymes:  Recent Labs Lab 05/26/16 2305 05/27/16 0025 05/27/16 0259 05/27/16 1212 05/27/16 1852  05/28/16 0634 05/29/16 0239 05/30/16 0504 06/01/16 0605 06/02/16 0636  CKTOTAL 25,775*  --  28,687* 28,316* 25,261*  < > 19,483* 6,520* 2,989* 619* 484*  TROPONINI 0.13* 0.15* 0.48* 0.18* 0.12*  --   --   --   --   --   --   < > = values in this interval not displayed.  HbA1C: Hgb A1c MFr Bld  Date/Time Value Ref Range Status  05/27/2016 12:12 PM 10.0 (H) 4.8 - 5.6 % Final    Comment:    (NOTE)         Pre-diabetes: 5.7 - 6.4         Diabetes: >6.4         Glycemic control for adults with diabetes: <7.0     CBG:  Recent Labs Lab 06/01/16 1956 06/02/16 0004 06/02/16 0501 06/02/16 0747 06/02/16 1150  GLUCAP  149* 107* 124* 126* 190*    Recent Results (from the past 240 hour(s))  Blood Culture (routine x 2)     Status: Abnormal   Collection Time: 05/26/16 11:55 PM  Result Value Ref Range Status   Specimen Description BLOOD LEFT ANTECUBITAL  Final   Special Requests BOTTLES DRAWN AEROBIC AND ANAEROBIC 5CC  Final   Culture  Setup Time  Final    GRAM NEGATIVE RODS IN BOTH AEROBIC AND ANAEROBIC BOTTLES Organism ID to follow CRITICAL RESULT CALLED TO, READ BACK BY AND VERIFIED WITH: N. Batchelder Pharm.D. 13:15 05/27/16  ([email or web address])    Culture ESCHERICHIA COLI (A)  Final   Report Status 05/29/2016 FINAL  Final   Organism ID, Bacteria ESCHERICHIA COLI  Final      Susceptibility   Escherichia coli - MIC*    AMPICILLIN <=2 SENSITIVE Sensitive     CEFAZOLIN <=4 SENSITIVE Sensitive     CEFEPIME <=1 SENSITIVE Sensitive     CEFTAZIDIME <=1 SENSITIVE Sensitive     CEFTRIAXONE <=1 SENSITIVE Sensitive     CIPROFLOXACIN <=0.25 SENSITIVE Sensitive     GENTAMICIN <=1 SENSITIVE Sensitive     IMIPENEM <=0.25 SENSITIVE Sensitive     TRIMETH/SULFA <=20 SENSITIVE Sensitive     AMPICILLIN/SULBACTAM <=2 SENSITIVE Sensitive     PIP/TAZO <=4 SENSITIVE Sensitive     Extended ESBL NEGATIVE Sensitive     * ESCHERICHIA COLI  Blood Culture ID Panel (Reflexed)     Status: Abnormal   Collection Time: 05/26/16 11:55 PM  Result Value Ref Range Status   Enterococcus species NOT DETECTED NOT DETECTED Final   Listeria monocytogenes NOT DETECTED NOT DETECTED Final   Staphylococcus species NOT DETECTED NOT DETECTED Final   Staphylococcus aureus NOT DETECTED NOT DETECTED Final   Streptococcus species NOT DETECTED NOT DETECTED Final   Streptococcus agalactiae NOT DETECTED NOT DETECTED Final   Streptococcus pneumoniae NOT DETECTED NOT DETECTED Final   Streptococcus pyogenes NOT DETECTED NOT DETECTED Final   Acinetobacter baumannii NOT DETECTED NOT DETECTED Final   Enterobacteriaceae species DETECTED (A) NOT DETECTED  Final    Comment: CRITICAL RESULT CALLED TO, READ BACK BY AND VERIFIED WITH: N. Batchelder Pharm.D. 13:15 05/27/16 ([email or web address])    Enterobacter cloacae complex NOT DETECTED NOT DETECTED Final   Escherichia coli DETECTED (A) NOT DETECTED Final    Comment: CRITICAL RESULT CALLED TO, READ BACK BY AND VERIFIED WITH: N. Batchelder Pharm.D. 13:15 05/27/16 ([email or web address])    Klebsiella oxytoca NOT DETECTED NOT DETECTED Final   Klebsiella pneumoniae NOT DETECTED NOT DETECTED Final   Proteus species NOT DETECTED NOT DETECTED Final   Serratia marcescens NOT DETECTED NOT DETECTED Final   Carbapenem resistance NOT DETECTED NOT DETECTED Final   Haemophilus influenzae NOT DETECTED NOT DETECTED Final   Neisseria meningitidis NOT DETECTED NOT DETECTED Final   Pseudomonas aeruginosa NOT DETECTED NOT DETECTED Final   Candida albicans NOT DETECTED NOT DETECTED Final   Candida glabrata NOT DETECTED NOT DETECTED Final   Candida krusei NOT DETECTED NOT DETECTED Final   Candida parapsilosis NOT DETECTED NOT DETECTED Final   Candida tropicalis NOT DETECTED NOT DETECTED Final  Blood Culture (routine x 2)     Status: Abnormal   Collection Time: 05/27/16 12:20 AM  Result Value Ref Range Status   Specimen Description BLOOD RIGHT HAND  Final   Special Requests IN PEDIATRIC BOTTLE 1CC  Final   Culture  Setup Time   Final    GRAM NEGATIVE RODS AEROBIC BOTTLE ONLY CRITICAL RESULT CALLED TO, READ BACK BY AND VERIFIED WITH: N. Batchelder Pharm.D. 13:15 05/27/16  ([email or web address])    Culture (A)  Final    ESCHERICHIA COLI SUSCEPTIBILITIES PERFORMED ON PREVIOUS CULTURE WITHIN THE LAST 5 DAYS.    Report Status 05/29/2016 FINAL  Final  Urine culture     Status: Abnormal   Collection Time: 05/27/16  2:44 AM  Result Value Ref Range  Status   Specimen Description URINE, CLEAN CATCH  Final   Special Requests NONE  Final   Culture >=100,000 COLONIES/mL ESCHERICHIA COLI (A)  Final   Report Status 05/29/2016 FINAL  Final   Organism  ID, Bacteria ESCHERICHIA COLI (A)  Final      Susceptibility   Escherichia coli - MIC*    AMPICILLIN 4 SENSITIVE Sensitive     CEFAZOLIN <=4 SENSITIVE Sensitive     CEFTRIAXONE <=1 SENSITIVE Sensitive     CIPROFLOXACIN <=0.25 SENSITIVE Sensitive     GENTAMICIN <=1 SENSITIVE Sensitive     IMIPENEM <=0.25 SENSITIVE Sensitive     NITROFURANTOIN <=16 SENSITIVE Sensitive     TRIMETH/SULFA <=20 SENSITIVE Sensitive     AMPICILLIN/SULBACTAM <=2 SENSITIVE Sensitive     PIP/TAZO <=4 SENSITIVE Sensitive     Extended ESBL NEGATIVE Sensitive     * >=100,000 COLONIES/mL ESCHERICHIA COLI  MRSA PCR Screening     Status: None   Collection Time: 05/27/16  3:03 PM  Result Value Ref Range Status   MRSA by PCR NEGATIVE NEGATIVE Final    Comment:        The GeneXpert MRSA Assay (FDA approved for NASAL specimens only), is one component of a comprehensive MRSA colonization surveillance program. It is not intended to diagnose MRSA infection nor to guide or monitor treatment for MRSA infections.      Scheduled Meds: . calcitRIOL  0.25 mcg Oral Daily  . calcium acetate  1,334 mg Oral TID WC  . cefTRIAXone (ROCEPHIN)  IV  2 g Intravenous Q24H  . famotidine  20 mg Oral BID  . heparin subcutaneous  5,000 Units Subcutaneous Q8H  . insulin aspart  0-20 Units Subcutaneous Q4H  . insulin aspart  8 Units Subcutaneous TID WC  . insulin glargine  14 Units Subcutaneous Q24H  . multivitamin  1 tablet Oral QHS     LOS: 6 days   Lonia Blood, MD [facility] Office  [phone] Pager - Text Page per Loretha Stapler as per below:  On-Call/Text Page:      [email or web address]      password [email or web address]  If 7PM-7AM, please contact night-coverage [email or web address] Password [email or web address] 06/02/2016, 4:11 PM

## 2016-06-02 NOTE — Progress Notes (Signed)
Patient Name: Jonathan Waller Date of Encounter: 06/02/2016  Primary Cardiologist: New, Dr. [place]  Hospital Problem List     Principal Problem:   Sepsis [facility]) Active Problems:   IDDM (insulin dependent diabetes mellitus) (HCC)   HTN (hypertension)   AKI (acute kidney injury) (HCC)   Delirium   Rhabdomyolysis   Transaminitis   Diarrhea   Cholangitis   Cardiomyopathy (HCC)   Demand ischemia (HCC)   Bacteremia due to Escherichia coli   Urinary tract infection without hematuria   Acute systolic CHF (congestive heart failure) (HCC)   Pre-eclampsia superimposed on chronic hypertension, antepartum     Subjective   Having acid reflux while he eats. Denies chest pain and SOB.   Inpatient Medications    Scheduled Meds: . calcitRIOL  0.25 mcg Oral Daily  . calcium acetate  1,334 mg Oral TID WC  . cefTRIAXone (ROCEPHIN)  IV  2 g Intravenous Q24H  . famotidine  20 mg Oral BID  . heparin subcutaneous  5,000 Units Subcutaneous Q8H  . insulin aspart  0-20 Units Subcutaneous Q4H  . insulin aspart  8 Units Subcutaneous TID WC  . insulin glargine  14 Units Subcutaneous Q24H  . multivitamin  1 tablet Oral QHS   Continuous Infusions:  PRN Meds: acetaminophen **OR** acetaminophen, ondansetron (ZOFRAN) IV, phenol, sodium chloride flush   Vital Signs    Vitals:   06/01/16 1657 06/01/16 2151 06/02/16 0505 06/02/16 0855  BP: (!) 121/59 (!) 111/41 (!) 128/58 124/61  Pulse: 84 67 79 71  Resp: 18 18 16 17   Temp: 98.7 F (37.1 C) 98 F (36.7 C) 97.9 F (36.6 C) 98 F (36.7 C)  TempSrc: Oral Oral Oral Oral  SpO2: 97% 97% 96% 99%  Weight:  264 lb 5.3 oz (119.9 kg)    Height:        Intake/Output Summary (Last 24 hours) at 06/02/16 1249 Last data filed at 06/02/16 0930  Gross per 24 hour  Intake              460 ml  Output             1595 ml  Net            -1135 ml   Filed Weights   05/31/16 2235 06/01/16 0151 06/01/16 2151  Weight: 250 lb 3.2 oz (113.5 kg) 250 lb 3.6  oz (113.5 kg) 264 lb 5.3 oz (119.9 kg)    Physical Exam   GEN: Well nourished, well developed, in no acute distress.  HEENT: Grossly normal.  Neck: Supple, no JVD, carotid bruits, or masses. Cardiac: RRR, no murmurs, rubs, or gallops. No clubbing, cyanosis, edema.  Radials/DP/PT 2+ and equal bilaterally.  Respiratory:  Respirations regular and unlabored, clear to auscultation bilaterally. GI: Soft, nontender, nondistended, BS + x 4. MS: no deformity or atrophy. Skin: warm and dry, no rash. Neuro:  Strength and sensation are intact. Psych: AAOx3.  Normal affect.  Labs    CBC  Recent Labs  06/01/16 0605  WBC 10.3  HGB 9.8*  HCT 28.3*  MCV 86.8  PLT 190   Basic Metabolic Panel  Recent Labs  06/01/16 0605 06/02/16 0636  NA 134* 133*  K 3.8 4.1  CL 94* 92*  CO2 25 21*  GLUCOSE 117* 129*  BUN 98* 119*  CREATININE 7.85* 8.86*  CALCIUM 8.5* 8.7*  MG 2.2 2.2  PHOS 7.2* 7.9*   Liver Function Tests  Recent Labs  05/31/16 0456 06/01/16 7341  06/02/16 0636  AST 136*  --  104*  ALT 122*  --  117*  ALKPHOS 120  --  110  BILITOT 2.0*  --  0.5  PROT 5.9*  --  6.4*  ALBUMIN 1.9* 1.9* 2.2*  Cardiac Enzymes  Recent Labs  06/01/16 0605 06/02/16 0636  CKTOTAL 619* 484*     Telemetry     NSR- Personally Reviewed  ECG    Sinus tach, LAFB, RBBB- Personally Reviewed  Radiology    No results found.  Cardiac Studies  Transthoracic Echocardiogram 05/27/16 Study Conclusions  - Left ventricle: The cavity size was normal. The estimated   ejection fraction was 40%. There is akinesis of the mid   anteroseptal and apical septal myocardium. There is akinesis of   the entireinferior and apical myocardium. There is akinesis of   the apicalanterior myocardium. The study is not technically   sufficient to allow evaluation of LV diastolic function. - Aortic valve: Trileaflet; mildly thickened, mildly calcified   leaflets. There was trivial regurgitation. - Aorta:  Aortic root dimension: 38 mm (ED). - Aortic root: The aortic root was mildly dilated. - Pulmonary arteries: PA peak pressure: 41 mm Hg (S).  Impressions:  - The right ventricular systolic pressure was increased consistent   with mild pulmonary hypertension.  Patient Profile     75 year old man who [place], history of right nephrectomy, admitted 11/27 with generalized weakness and fevers, labs showed rhabdomyolysis with CK of 28 K, ARF. Blood cultures subsequently growing Escherichia coli. Echo shows EF 40% with regional wall motion abnormality.  Assessment & Plan  1. LV dysfunction: According to Dr. Elvis CoilJordan's note from Friday 12/1: Regional wall motion abnormality suggests CAD but no prior history. Suspect some of LV dysfunction related to sepsis syndrome. Will need further assessment once recovered from acute illness. Volume overloaded due to volume resuscitation and RF. Tolerating dialysis well and volume status improved. Not a candidate for ACEi/ARB due to  RF. BP is good so we did add Coreg.  Would hold off on statin due to acute elevation of liver enzymes but consider starting once acute illness improved. It may be worthwhile repeating Echo next week to see if LV function improved after treating sepsis. He is not a candidate for invasive evaluation until renal function recovers.  2. E coli sepsis. ? If source UTI versus cholecystitis. On antibiotics per CCM  3. Severe Rhabdomyolysis. CK improving.  4. ARF secondary to #2 and #3. 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 Nephrology following. Getting HD this admission  5. Acute respiratory distress secondary to volume overload, sepsis, and acidosis. Resolved.    Signed, Little IshikawaErin E Smith, NP  06/02/2016, 12:49 PM    I have seen and examined the patient along with Little IshikawaErin E Smith, NP .  I have reviewed the chart, notes and new data.  I agree with NP's note.  Key new complaints: no angina Key  examination changes: no overt CHF Key new findings / data: creatinine remains severely elevated.  PLAN: Reevaluate LV wall motion and EF. No plans for invasive angio in near future.  Thurmon FairMihai Divine Imber, MD, [facility] [phone] 06/02/2016, 3:25 PM

## 2016-06-03 LAB — CBC
HEMATOCRIT: 28.1 % — AB (ref 39.0–52.0)
HEMOGLOBIN: 9.7 g/dL — AB (ref 13.0–17.0)
MCH: 30.1 pg (ref 26.0–34.0)
MCHC: 34.5 g/dL (ref 30.0–36.0)
MCV: 87.3 fL (ref 78.0–100.0)
Platelets: 286 10*3/uL (ref 150–400)
RBC: 3.22 MIL/uL — AB (ref 4.22–5.81)
RDW: 14.7 % (ref 11.5–15.5)
WBC: 7.7 10*3/uL (ref 4.0–10.5)

## 2016-06-03 LAB — GLUCOSE, CAPILLARY
GLUCOSE-CAPILLARY: 154 mg/dL — AB (ref 65–99)
GLUCOSE-CAPILLARY: 190 mg/dL — AB (ref 65–99)
GLUCOSE-CAPILLARY: 157 mg/dL — AB (ref 65–99)
GLUCOSE-CAPILLARY: 159 mg/dL — AB (ref 65–99)
GLUCOSE-CAPILLARY: 154 mg/dL — AB (ref 65–99)
Glucose-Capillary: 154 mg/dL — ABNORMAL HIGH (ref 65–99)
Glucose-Capillary: 162 mg/dL — ABNORMAL HIGH (ref 65–99)

## 2016-06-03 LAB — RENAL FUNCTION PANEL
ALBUMIN: 2.2 g/dL — AB (ref 3.5–5.0)
ANION GAP: 15 (ref 5–15)
BUN: 127 mg/dL — ABNORMAL HIGH (ref 6–20)
CO2: 24 mmol/L (ref 22–32)
Calcium: 8.7 mg/dL — ABNORMAL LOW (ref 8.9–10.3)
Chloride: 92 mmol/L — ABNORMAL LOW (ref 101–111)
Creatinine, Ser: 8.93 mg/dL — ABNORMAL HIGH (ref 0.61–1.24)
GFR, EST AFRICAN AMERICAN: 6 mL/min — AB (ref >60)
GFR, EST NON AFRICAN AMERICAN: 5 mL/min — AB (ref >60)
Glucose, Bld: 154 mg/dL — ABNORMAL HIGH (ref 65–99)
PHOSPHORUS: 8.7 mg/dL — AB (ref 2.5–4.6)
POTASSIUM: 3.8 mmol/L (ref 3.5–5.1)
Sodium: 131 mmol/L — ABNORMAL LOW (ref 135–145)

## 2016-06-03 MED ORDER — PANTOPRAZOLE SODIUM 40 MG PO TBEC
40.0000 mg | DELAYED_RELEASE_TABLET | Freq: Every day | ORAL | Status: DC
  Administered 2016-06-03 – 2016-06-06 (×4): 40 mg via ORAL
  Filled 2016-06-03 (×4): qty 1

## 2016-06-03 NOTE — Care Management Important Message (Signed)
Important Message  Patient Details  Name: Jonathan Waller MRN: [id number] Date of Birth: 1941/05/07   Medicare Important Message Given:  Yes    Patriciaann Rabanal 06/03/2016, 11:01 AM

## 2016-06-03 NOTE — Progress Notes (Signed)
PROGRESS NOTE [facility]   Jonathan Waller   ZOX:[id number] DOB: December 31, 1940  DOA: 05/26/2016 PCP: Pcp Not In System   Brief Narrative:  75 year old male with past medical history diabetes type 2, hypertension, solitary kidney and hyperlipidemia presented to the ED complaining of generalized weakness, shortness of breath, productive cough, and urinary urgency. Patient fell and was found to be down in the floor for hours. In the ED patient was found to be septic with fevers with acute kidney failure and hypotensive. Patient initially admitted to the ICU due to septic rhabdomyolysis and acute kidney failure for pressors IV fluids and antibiotics. Nephrology was consulted and he will start dialysis. During the hospital stay patient developed chest pain troponins were elevated cardiology was consulted and deemed to be demand ischemia due to infectious process. Cardiogram was done which showed low EF 40%. Subsequently was repeated and showed 50-55%. Patient was transferred to [facility] for further management. Patient remains in the hospital for eventual renal recovery. Nephrology holding dialysis at this point given patient with some urine output  Subjective: Feeling well, c/o GERDs no acute events overnight. Patient continues to produce good amount of urine.   Assessment & Plan:  Severe Sepsis due to Escherichia coli UTI w/ bacteremia To complete a 2 week course of antibiotic tx  On Ceftriaxone  Repeat Blood cultures   Acute renal failure - secondary to sepsis and rhabdomyolysis, requiring dialysis Renal US noted S/P right nephrectomy, multiple left renal cysts Holding all nephrotoxic medication HD per Nephrology - continue to hold HD given good UOP   Hyperkalemia - resolved   Demand ischemia vs MI vs Takotsubo  Patient with abnormal EKG, positive troponins R/O MI vs demand ischemia - Cards following, but no plans for angio at this time given ARF Recommending Lexiscan Myoview as  outpatient   Acute Systolic CHF Echocardiogram: Systolic CHF and pulmonary hypertension - volume control per HD  Rhabdomyolysis secondary to patient laying in bed most of the weekend / falling - now essentially resolved   Transaminitis Resolving  Acalculus Cholecystitis Most likely an incidental finding  DM2 CBG currently reasonably controlled - no change in treatment plan today  Acute Delirium Most likely secondary to sepsis: Resolved  GERDs Pepcid changed to Protonix   DVT prophylaxis: Heparin  Code Status: Full Family Communication: Daughter at beside  Disposition Plan: Home when cleared by nephrology   Consultants:   Nephrology   Cardiology   Procedures:  11/28 Renal ultrasound:-S/P Right nephrectomy. - Multiple left renal cysts, 11/28 Echocardiogram:- LVEF=40%. akinesis of the mid anteroseptal and apical septal myocardium. There is akinesis of the entireinferior and apical myocardium. There is akinesis of the apicalanterior myocardium.   - Pulmonary arteries: PA peak pressure: 41 mm Hg (S). 11/28 RUQ abdominal ultrasound: -Gallbladder concerning for acalculus cholecystitis. -Cystic area containing minimal septations in the anterior segment right lobe of the liver. Appears benign. -No biliary duct dilatation evident. 12/5 ECHO  - Left ventricle: The cavity size was normal. Wall thickness was normal. Systolic function was normal. The estimated ejection fraction was in the range of 50% to 55%. Wall motion was normal; there were no regional wall motion abnormalities. Doppler parameters are consistent with abnormal left ventricular   relaxation (grade 1 diastolic dysfunction).  Antimicrobials: Ceftaz 11/30>> Zosyn 11/27>>11/30 Vancomycin 11/27>> 11/28   Objective: Vitals:   06/02/16 1741 06/02/16 2100 06/03/16 0642 06/03/16 0900  BP: (!) 126/56 (!) 105/59 (!) 119/42 (!) 110/50  Pulse: 71 73 74 76  Resp: 18  17 16 17   Temp: 97.9 F (36.6 C) 98 F (36.7 C)  98.1 F (36.7 C) 98.2 F (36.8 C)  TempSrc: Oral Oral Oral Oral  SpO2: 98% 98% 98% 98%  Weight:  120.1 kg (264 lb 12.4 oz)    Height:        Intake/Output Summary (Last 24 hours) at 06/03/16 1801 Last data filed at 06/03/16 1400  Gross per 24 hour  Intake             1062 ml  Output             2350 ml  Net            -1288 ml   Filed Weights   06/01/16 0151 06/01/16 2151 06/02/16 2100  Weight: 113.5 kg (250 lb 3.6 oz) 119.9 kg (264 lb 5.3 oz) 120.1 kg (264 lb 12.4 oz)    Examination:  General exam: Appears calm and comfortable  HEENT: AC/AT, PERRLA, OP moist and clear Respiratory system: Clear to auscultation. No wheezes,crackle or rhonchi Cardiovascular system: S1 & S2 heard, RRR. No JVD, murmurs, rubs or gallops Gastrointestinal system: Abdomen is nondistended, soft and nontender. No organomegaly or masses felt. Normal bowel sounds heard. Central nervous system: Alert and oriented. No focal neurological deficits. Extremities: No pedal edema. Symmetric, strength 5/5   Skin: No rashes, lesions or ulcers Psychiatry: Judgement and insight appear normal. Mood & affect appropriate.    Data Reviewed: I have personally reviewed following labs and imaging studies  CBC:  Recent Labs Lab 05/28/16 0634 05/29/16 1127 05/30/16 0504 06/01/16 0605 06/03/16 0500  WBC 15.8* 11.5* 8.9 10.3 7.7  NEUTROABS 13.6*  --   --   --   --   HGB 11.0* 10.2* 10.6* 9.8* 9.7*  HCT 31.2* 29.1* 30.5* 28.3* 28.1*  MCV 87.2 86.6 86.9 86.8 87.3  PLT 97* 124* 147* 190 286   Basic Metabolic Panel:  Recent Labs Lab 05/29/16 0239 05/30/16 0504 05/31/16 0456 06/01/16 0605 06/02/16 0636 06/03/16 0500  NA 134* 132* 132* 134* 133* 131*  K 4.5 4.1 4.7 3.8 4.1 3.8  CL 95* 94* 93* 94* 92* 92*  CO2 24 22 23 25  21* 24  GLUCOSE 199* 146* 117* 117* 129* 154*  BUN 70* 88* 120* 98* 119* 127*  CREATININE 7.54* 7.49* 9.09* 7.85* 8.86* 8.93*  CALCIUM 8.2* 8.3* 8.2* 8.5* 8.7* 8.7*  MG 2.1 2.3 2.6* 2.2  2.2  --   PHOS 4.5 4.9* 7.1* 7.2* 7.9* 8.7*   GFR: Estimated Creatinine Clearance: 9.8 mL/min (by C-G formula based on SCr of 8.93 mg/dL (H)). Liver Function Tests:  Recent Labs Lab 05/28/16 0634  05/30/16 0504 05/31/16 0456 06/01/16 0605 06/02/16 0636 06/03/16 0500  AST 376*  --  160* 136*  --  104*  --   ALT 148*  --  120* 122*  --  117*  --   ALKPHOS 102  --  110 120  --  110  --   BILITOT 1.2  --  0.8 2.0*  --  0.5  --   PROT 6.8  --  6.5 5.9*  --  6.4*  --   ALBUMIN 2.5*  2.4*  < > 1.9* 1.9* 1.9* 2.2* 2.2*  < > = values in this interval not displayed. No results for input(s): LIPASE, AMYLASE in the last 168 hours. No results for input(s): AMMONIA in the last 168 hours. Coagulation Profile: No results for input(s): INR, PROTIME in the last 168 hours. Cardiac  Enzymes:  Recent Labs Lab 05/27/16 1852  05/28/16 0634 05/29/16 0239 05/30/16 0504 06/01/16 0605 06/02/16 0636  CKTOTAL 25,261*  < > 19,483* 6,520* 2,989* 619* 484*  TROPONINI 0.12*  --   --   --   --   --   --   < > = values in this interval not displayed. BNP (last 3 results) No results for input(s): PROBNP in the last 8760 hours. HbA1C: No results for input(s): HGBA1C in the last 72 hours. CBG:  Recent Labs Lab 06/03/16 0305 06/03/16 0749 06/03/16 1206 06/03/16 1430 06/03/16 1647  GLUCAP 154* 190* 157* 159* 154*   Lipid Profile: No results for input(s): CHOL, HDL, LDLCALC, TRIG, CHOLHDL, LDLDIRECT in the last 72 hours. Thyroid Function Tests: No results for input(s): TSH, T4TOTAL, FREET4, T3FREE, THYROIDAB in the last 72 hours. Anemia Panel: No results for input(s): VITAMINB12, FOLATE, FERRITIN, TIBC, IRON, RETICCTPCT in the last 72 hours. Sepsis Labs: No results for input(s): PROCALCITON, LATICACIDVEN in the last 168 hours.  Recent Results (from the past 240 hour(s))  Blood Culture (routine x 2)     Status: Abnormal   Collection Time: 05/26/16 11:55 PM  Result Value Ref Range Status    Specimen Description BLOOD LEFT ANTECUBITAL  Final   Special Requests BOTTLES DRAWN AEROBIC AND ANAEROBIC 5CC  Final   Culture  Setup Time   Final    GRAM NEGATIVE RODS IN BOTH AEROBIC AND ANAEROBIC BOTTLES Organism ID to follow CRITICAL RESULT CALLED TO, READ BACK BY AND VERIFIED WITH: N. Batchelder Pharm.D. 13:15 05/27/16  ([email or web address])    Culture ESCHERICHIA COLI (A)  Final   Report Status 05/29/2016 FINAL  Final   Organism ID, Bacteria ESCHERICHIA COLI  Final      Susceptibility   Escherichia coli - MIC*    AMPICILLIN <=2 SENSITIVE Sensitive     CEFAZOLIN <=4 SENSITIVE Sensitive     CEFEPIME <=1 SENSITIVE Sensitive     CEFTAZIDIME <=1 SENSITIVE Sensitive     CEFTRIAXONE <=1 SENSITIVE Sensitive     CIPROFLOXACIN <=0.25 SENSITIVE Sensitive     GENTAMICIN <=1 SENSITIVE Sensitive     IMIPENEM <=0.25 SENSITIVE Sensitive     TRIMETH/SULFA <=20 SENSITIVE Sensitive     AMPICILLIN/SULBACTAM <=2 SENSITIVE Sensitive     PIP/TAZO <=4 SENSITIVE Sensitive     Extended ESBL NEGATIVE Sensitive     * ESCHERICHIA COLI  Blood Culture ID Panel (Reflexed)     Status: Abnormal   Collection Time: 05/26/16 11:55 PM  Result Value Ref Range Status   Enterococcus species NOT DETECTED NOT DETECTED Final   Listeria monocytogenes NOT DETECTED NOT DETECTED Final   Staphylococcus species NOT DETECTED NOT DETECTED Final   Staphylococcus aureus NOT DETECTED NOT DETECTED Final   Streptococcus species NOT DETECTED NOT DETECTED Final   Streptococcus agalactiae NOT DETECTED NOT DETECTED Final   Streptococcus pneumoniae NOT DETECTED NOT DETECTED Final   Streptococcus pyogenes NOT DETECTED NOT DETECTED Final   Acinetobacter baumannii NOT DETECTED NOT DETECTED Final   Enterobacteriaceae species DETECTED (A) NOT DETECTED Final    Comment: CRITICAL RESULT CALLED TO, READ BACK BY AND VERIFIED WITH: N. Batchelder Pharm.D. 13:15 05/27/16 ([email or web address])    Enterobacter cloacae complex NOT DETECTED NOT DETECTED Final    Escherichia coli DETECTED (A) NOT DETECTED Final    Comment: CRITICAL RESULT CALLED TO, READ BACK BY AND VERIFIED WITH: N. Batchelder Pharm.D. 13:15 05/27/16 ([email or web address])    Klebsiella oxytoca NOT DETECTED NOT DETECTED Final   Klebsiella pneumoniae  NOT DETECTED NOT DETECTED Final   Proteus species NOT DETECTED NOT DETECTED Final   Serratia marcescens NOT DETECTED NOT DETECTED Final   Carbapenem resistance NOT DETECTED NOT DETECTED Final   Haemophilus influenzae NOT DETECTED NOT DETECTED Final   Neisseria meningitidis NOT DETECTED NOT DETECTED Final   Pseudomonas aeruginosa NOT DETECTED NOT DETECTED Final   Candida albicans NOT DETECTED NOT DETECTED Final   Candida glabrata NOT DETECTED NOT DETECTED Final   Candida krusei NOT DETECTED NOT DETECTED Final   Candida parapsilosis NOT DETECTED NOT DETECTED Final   Candida tropicalis NOT DETECTED NOT DETECTED Final  Blood Culture (routine x 2)     Status: Abnormal   Collection Time: 05/27/16 12:20 AM  Result Value Ref Range Status   Specimen Description BLOOD RIGHT HAND  Final   Special Requests IN PEDIATRIC BOTTLE 1CC  Final   Culture  Setup Time   Final    GRAM NEGATIVE RODS AEROBIC BOTTLE ONLY CRITICAL RESULT CALLED TO, READ BACK BY AND VERIFIED WITH: N. Batchelder Pharm.D. 13:15 05/27/16  ([email or web address])    Culture (A)  Final    ESCHERICHIA COLI SUSCEPTIBILITIES PERFORMED ON PREVIOUS CULTURE WITHIN THE LAST 5 DAYS.    Report Status 05/29/2016 FINAL  Final  Urine culture     Status: Abnormal   Collection Time: 05/27/16  2:44 AM  Result Value Ref Range Status   Specimen Description URINE, CLEAN CATCH  Final   Special Requests NONE  Final   Culture >=100,000 COLONIES/mL ESCHERICHIA COLI (A)  Final   Report Status 05/29/2016 FINAL  Final   Organism ID, Bacteria ESCHERICHIA COLI (A)  Final      Susceptibility   Escherichia coli - MIC*    AMPICILLIN 4 SENSITIVE Sensitive     CEFAZOLIN <=4 SENSITIVE Sensitive     CEFTRIAXONE <=1 SENSITIVE  Sensitive     CIPROFLOXACIN <=0.25 SENSITIVE Sensitive     GENTAMICIN <=1 SENSITIVE Sensitive     IMIPENEM <=0.25 SENSITIVE Sensitive     NITROFURANTOIN <=16 SENSITIVE Sensitive     TRIMETH/SULFA <=20 SENSITIVE Sensitive     AMPICILLIN/SULBACTAM <=2 SENSITIVE Sensitive     PIP/TAZO <=4 SENSITIVE Sensitive     Extended ESBL NEGATIVE Sensitive     * >=100,000 COLONIES/mL ESCHERICHIA COLI  MRSA PCR Screening     Status: None   Collection Time: 05/27/16  3:03 PM  Result Value Ref Range Status   MRSA by PCR NEGATIVE NEGATIVE Final    Comment:        The GeneXpert MRSA Assay (FDA approved for NASAL specimens only), is one component of a comprehensive MRSA colonization surveillance program. It is not intended to diagnose MRSA infection nor to guide or monitor treatment for MRSA infections.          Radiology Studies: No results found.    Scheduled Meds: . calcitRIOL  0.25 mcg Oral Daily  . calcium acetate  1,334 mg Oral TID WC  . cefTRIAXone (ROCEPHIN)  IV  2 g Intravenous Q24H  . heparin subcutaneous  5,000 Units Subcutaneous Q8H  . insulin aspart  0-20 Units Subcutaneous Q4H  . insulin aspart  8 Units Subcutaneous TID WC  . insulin glargine  14 Units Subcutaneous Q24H  . multivitamin  1 tablet Oral QHS  . pantoprazole  40 mg Oral Daily   Continuous Infusions:   LOS: 7 days    Jonathan Dodrill, MD [facility] Pager [phone]  If 7PM-7AM, please contact night-coverage [email or web address] Password [email or web address] 06/03/2016, 6:01  PM  

## 2016-06-03 NOTE — Progress Notes (Signed)
Duplicate/error

## 2016-06-03 NOTE — Progress Notes (Signed)
Patient Name: Jonathan Waller Date of Encounter: 06/03/2016  Primary Cardiologist: [place] (new, visiting from [place])  Hospital Problem List     Principal Problem:   Sepsis [facility]) Active Problems:   IDDM (insulin dependent diabetes mellitus) (HCC)   HTN (hypertension)   AKI (acute kidney injury) (HCC)   Delirium   Rhabdomyolysis   Transaminitis   Diarrhea   Cholangitis   Cardiomyopathy (HCC)   Demand ischemia (HCC)   Bacteremia due to Escherichia coli   Urinary tract infection without hematuria   Acute systolic CHF (congestive heart failure) (HCC)   Pre-eclampsia superimposed on chronic hypertension, antepartum     Subjective   Poor appetite. Some improvement in UO and creatinine rate of rise has lessened.  Inpatient Medications    Scheduled Meds: . calcitRIOL  0.25 mcg Oral Daily  . calcium acetate  1,334 mg Oral TID WC  . cefTRIAXone (ROCEPHIN)  IV  2 g Intravenous Q24H  . heparin subcutaneous  5,000 Units Subcutaneous Q8H  . insulin aspart  0-20 Units Subcutaneous Q4H  . insulin aspart  8 Units Subcutaneous TID WC  . insulin glargine  14 Units Subcutaneous Q24H  . multivitamin  1 tablet Oral QHS  . pantoprazole  40 mg Oral Daily   Continuous Infusions:  PRN Meds: acetaminophen **OR** acetaminophen, ondansetron (ZOFRAN) IV, phenol, sodium chloride flush   Vital Signs    Vitals:   06/02/16 1741 06/02/16 2100 06/03/16 0642 06/03/16 0900  BP: (!) 126/56 (!) 105/59 (!) 119/42 (!) 110/50  Pulse: 71 73 74 76  Resp: 18 17 16 17   Temp: 97.9 F (36.6 C) 98 F (36.7 C) 98.1 F (36.7 C) 98.2 F (36.8 C)  TempSrc: Oral Oral Oral Oral  SpO2: 98% 98% 98% 98%  Weight:  264 lb 12.4 oz (120.1 kg)    Height:        Intake/Output Summary (Last 24 hours) at 06/03/16 1721 Last data filed at 06/03/16 1400  Gross per 24 hour  Intake             1062 ml  Output             2800 ml  Net            -1738 ml   Filed Weights   06/01/16 0151 06/01/16 2151 06/02/16  2100  Weight: 250 lb 3.6 oz (113.5 kg) 264 lb 5.3 oz (119.9 kg) 264 lb 12.4 oz (120.1 kg)    Physical Exam   R IJ dialysis cath GEN: Well nourished, well developed, in no acute distress.  HEENT: Grossly normal.  Neck: Supple, no JVD, carotid bruits, or masses. Cardiac: RRR, no murmurs, rubs, or gallops. No clubbing, cyanosis, edema.  Radials/DP/PT 2+ and equal bilaterally.  Respiratory:  Respirations regular and unlabored, clear to auscultation bilaterally. GI: Soft, nontender, nondistended, BS + x 4. MS: no deformity or atrophy. Skin: warm and dry, no rash. Neuro:  Strength and sensation are intact. Psych: AAOx3.  Normal affect.  Labs    CBC  Recent Labs  06/01/16 0605 06/03/16 0500  WBC 10.3 7.7  HGB 9.8* 9.7*  HCT 28.3* 28.1*  MCV 86.8 87.3  PLT 190 286   Basic Metabolic Panel  Recent Labs  06/01/16 0605 06/02/16 0636 06/03/16 0500  NA 134* 133* 131*  K 3.8 4.1 3.8  CL 94* 92* 92*  CO2 25 21* 24  GLUCOSE 117* 129* 154*  BUN 98* 119* 127*  CREATININE 7.85* 8.86* 8.93*  CALCIUM  8.5* 8.7* 8.7*  MG 2.2 2.2  --   PHOS 7.2* 7.9* 8.7*   Liver Function Tests  Recent Labs  06/02/16 0636 06/03/16 0500  AST 104*  --   ALT 117*  --   ALKPHOS 110  --   BILITOT 0.5  --   PROT 6.4*  --   ALBUMIN 2.2* 2.2*   No results for input(s): LIPASE, AMYLASE in the last 72 hours. Cardiac Enzymes  Recent Labs  06/01/16 0605 06/02/16 0636  CKTOTAL 619* 484*     Telemetry    NSR - Personally Reviewed  Cardiac Studies   Transthoracic Echocardiogram 05/27/16 - Left ventricle: The cavity size was normal. The estimated ejection fraction was 40%. There is akinesis of the mid anteroseptal and apical septal myocardium. There is akinesis of the entireinferior and apical myocardium. There is akinesis of the apicalanterior myocardium. The study is not technically sufficient to allow evaluation of LV diastolic function. - Aortic valve: Trileaflet; mildly  thickened, mildly calcified leaflets. There was trivial regurgitation. - Aorta: Aortic root dimension: 38 mm (ED). - Aortic root: The aortic root was mildly dilated. - Pulmonary arteries: PA peak pressure: 41 mm Hg (S).  Follow up echo 06/02/2016 - Left ventricle: The cavity size was normal. Wall thickness was   normal. Systolic function was normal. The estimated ejection   fraction was in the range of 50% to 55%. Wall motion was normal;   there were no regional wall motion abnormalities. Doppler   parameters are consistent with abnormal left ventricular   relaxation (grade 1 diastolic dysfunction).  Patient Profile     75 year old man who is [place] from [place], history of right nephrectomy, admitted 11/27 with E.coli sepsis, rhabdomyolysis with CK of 28 K, acute renal failure. Echo showed EF 40% with anteroapical wall motion abnormality, resolved on follow up echo a week later. Some possible early encouraging signs of renal recovery.  Assessment & Plan    LV dysfunction: Regional wall motion abnormality differential diagnosis is LAD stenosis and demand ischemia versus takotsubo sd. No prior history of CAD, but does have DM and HTN. No clinical CHF. He is not a candidate for invasive evaluation until renal function recovers. No plan for further cardiac evaluation during this hospitalization.   Recommend outpatient nuclear scan [facility](Lexiscan Myoview). If perfusion is normal, can assume thi was takotsubo sd., resolved and without need for further Rx.   If there is anterior wall ischemia, he will need coronary angiography (which should be delayed until renal function recovers or until it becomes apparent that he has ESRD and will never recover).  Signed, Thurmon FairMihai Asherah Lavoy, MD  06/03/2016, 5:21 PM

## 2016-06-03 NOTE — Progress Notes (Signed)
Patient ID: Jonathan BartersJames Johanson, male   DOB: 05-30-1941, 75 y.o.   MRN: [id number] S:c/o reflux and decreased appetite O:BP (!) 119/42 (BP Location: Right Arm)   Pulse 74   Temp 98.1 F (36.7 C) (Oral)   Resp 16   Ht 6\' 2"  (1.88 m)   Wt 120.1 kg (264 lb 12.4 oz)   SpO2 98%   BMI 33.99 kg/m   Intake/Output Summary (Last 24 hours) at 06/03/16 0903 Last data filed at 06/03/16 0600  Gross per 24 hour  Intake              820 ml  Output             2125 ml  Net            -1305 ml   Intake/Output: I/O last 3 completed shifts: In: 820 [P.O.:820] Out: 3145 [Urine:3145]  Intake/Output this shift:  No intake/output data recorded. Weight change: 0.2 kg (7.1 oz) Gen:WD obese AAM in NAD CVS:no rub Resp:cta WUJ:WJXBJYAbd:benign Ext:no edema   Recent Labs Lab 05/28/16 0634 05/29/16 0239 05/30/16 0504 05/31/16 0456 06/01/16 0605 06/02/16 0636 06/03/16 0500  NA 136  137 134* 132* 132* 134* 133* 131*  K 4.6  4.6 4.5 4.1 4.7 3.8 4.1 3.8  CL 98*  99* 95* 94* 93* 94* 92* 92*  CO2 23  24 24 22 23 25  21* 24  GLUCOSE 198*  199* 199* 146* 117* 117* 129* 154*  BUN 75*  74* 70* 88* 120* 98* 119* 127*  CREATININE 8.65*  8.49* 7.54* 7.49* 9.09* 7.85* 8.86* 8.93*  ALBUMIN 2.5*  2.4* 2.1* 1.9* 1.9* 1.9* 2.2* 2.2*  CALCIUM 8.5*  8.5* 8.2* 8.3* 8.2* 8.5* 8.7* 8.7*  PHOS 3.2  3.3 4.5 4.9* 7.1* 7.2* 7.9* 8.7*  AST 376*  --  160* 136*  --  104*  --   ALT 148*  --  120* 122*  --  117*  --    Liver Function Tests:  Recent Labs Lab 05/30/16 0504 05/31/16 0456 06/01/16 0605 06/02/16 0636 06/03/16 0500  AST 160* 136*  --  104*  --   ALT 120* 122*  --  117*  --   ALKPHOS 110 120  --  110  --   BILITOT 0.8 2.0*  --  0.5  --   PROT 6.5 5.9*  --  6.4*  --   ALBUMIN 1.9* 1.9* 1.9* 2.2* 2.2*   No results for input(s): LIPASE, AMYLASE in the last 168 hours. No results for input(s): AMMONIA in the last 168 hours. CBC:  Recent Labs Lab 05/28/16 0634 05/29/16 1127 05/30/16 0504  06/01/16 0605 06/03/16 0500  WBC 15.8* 11.5* 8.9 10.3 7.7  NEUTROABS 13.6*  --   --   --   --   HGB 11.0* 10.2* 10.6* 9.8* 9.7*  HCT 31.2* 29.1* 30.5* 28.3* 28.1*  MCV 87.2 86.6 86.9 86.8 87.3  PLT 97* 124* 147* 190 286   Cardiac Enzymes:  Recent Labs Lab 05/27/16 1212 05/27/16 1852  05/28/16 0634 05/29/16 0239 05/30/16 0504 06/01/16 0605 06/02/16 0636  CKTOTAL 28,316* 25,261*  < > 19,483* 6,520* 2,989* 619* 484*  TROPONINI 0.18* 0.12*  --   --   --   --   --   --   < > = values in this interval not displayed. CBG:  Recent Labs Lab 06/02/16 1633 06/02/16 2049 06/03/16 0008 06/03/16 0305 06/03/16 0749  GLUCAP 180* 203* 162* 154* 190*    Iron Studies:  No results for input(s): IRON, TIBC, TRANSFERRIN, FERRITIN in the last 72 hours. Studies/Results: No results found. . calcitRIOL  0.25 mcg Oral Daily  . calcium acetate  1,334 mg Oral TID WC  . cefTRIAXone (ROCEPHIN)  IV  2 g Intravenous Q24H  . famotidine  20 mg Oral BID  . heparin subcutaneous  5,000 Units Subcutaneous Q8H  . insulin aspart  0-20 Units Subcutaneous Q4H  . insulin aspart  8 Units Subcutaneous TID WC  . insulin glargine  14 Units Subcutaneous Q24H  . multivitamin  1 tablet Oral QHS    BMET    Component Value Date/Time   NA 131 (L) 06/03/2016 0500   K 3.8 06/03/2016 0500   CL 92 (L) 06/03/2016 0500   CO2 24 06/03/2016 0500   GLUCOSE 154 (H) 06/03/2016 0500   BUN 127 (H) 06/03/2016 0500   CREATININE 8.93 (H) 06/03/2016 0500   CALCIUM 8.7 (L) 06/03/2016 0500   GFRNONAA 5 (L) 06/03/2016 0500   GFRAA 6 (L) 06/03/2016 0500   CBC    Component Value Date/Time   WBC 7.7 06/03/2016 0500   RBC 3.22 (L) 06/03/2016 0500   HGB 9.7 (L) 06/03/2016 0500   HCT 28.1 (L) 06/03/2016 0500   PLT 286 06/03/2016 0500   MCV 87.3 06/03/2016 0500   MCH 30.1 06/03/2016 0500   MCHC 34.5 06/03/2016 0500   RDW 14.7 06/03/2016 0500   LYMPHSABS 0.5 (L) 05/28/2016 0634   MONOABS 1.8 (H) 05/28/2016 0634   EOSABS  0.0 05/28/2016 0634   BASOSABS 0.0 05/28/2016 0634      Assessment/Plan:  1. 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  Increased UOP.  Last HD session 05/31/16.   1. Rate of rise in Scr has slowed and marked improvement of UOP 2. Will hold off on HD for now and follow.  Hopefully he is regaining some function 2. E coli sepsis 3. DM- per primary 4. Anemia-  5. Thrombocytopenia- improving.  6. Abnormal LFT's- ?shock liver. Improving 7. acalculus cholecystitis-   Irena Cords, MD [facility] [phone]

## 2016-06-03 NOTE — Care Management Important Message (Signed)
Important Message  Patient Details  Name: Jonathan Waller MRN: [id number] Date of Birth: Mar 08, 1941   Medicare Important Message Given:  Yes    [name] Stefan Church 06/03/2016, 10:58 AM

## 2016-06-04 LAB — RENAL FUNCTION PANEL
ALBUMIN: 2.2 g/dL — AB (ref 3.5–5.0)
ANION GAP: 14 (ref 5–15)
BUN: 130 mg/dL — AB (ref 6–20)
CO2: 23 mmol/L (ref 22–32)
Calcium: 8.9 mg/dL (ref 8.9–10.3)
Chloride: 96 mmol/L — ABNORMAL LOW (ref 101–111)
Creatinine, Ser: 8.45 mg/dL — ABNORMAL HIGH (ref 0.61–1.24)
GFR calc Af Amer: 6 mL/min — ABNORMAL LOW (ref >60)
GFR, EST NON AFRICAN AMERICAN: 5 mL/min — AB (ref >60)
Glucose, Bld: 209 mg/dL — ABNORMAL HIGH (ref 65–99)
PHOSPHORUS: 8.2 mg/dL — AB (ref 2.5–4.6)
POTASSIUM: 4.1 mmol/L (ref 3.5–5.1)
Sodium: 133 mmol/L — ABNORMAL LOW (ref 135–145)

## 2016-06-04 LAB — CK: CK TOTAL: 222 U/L (ref 49–397)

## 2016-06-04 LAB — GLUCOSE, CAPILLARY
GLUCOSE-CAPILLARY: 259 mg/dL — AB (ref 65–99)
GLUCOSE-CAPILLARY: 220 mg/dL — AB (ref 65–99)
GLUCOSE-CAPILLARY: 224 mg/dL — AB (ref 65–99)
Glucose-Capillary: 215 mg/dL — ABNORMAL HIGH (ref 65–99)
Glucose-Capillary: 178 mg/dL — ABNORMAL HIGH (ref 65–99)
Glucose-Capillary: 292 mg/dL — ABNORMAL HIGH (ref 65–99)

## 2016-06-04 MED ORDER — SUCRALFATE 1 G PO TABS: 1.0000 g | ORAL_TABLET | Freq: Three times a day (TID) | ORAL | Status: DC

## 2016-06-04 MED ORDER — SUCRALFATE 1 GM/10ML PO SUSP
1.0000 g | Freq: Three times a day (TID) | ORAL | Status: DC
  Administered 2016-06-04 – 2016-06-06 (×10): 1 g via ORAL
  Filled 2016-06-04 (×10): qty 10

## 2016-06-04 NOTE — Evaluation (Signed)
Clinical/Bedside Swallow Evaluation Patient Details  Name: Jonathan Waller MRN: [id number] Date of Birth: June 30, 1941  Today's Date: 06/04/2016 Time: SLP Start Time (ACUTE ONLY): 3016 SLP Stop Time (ACUTE ONLY): 0933 SLP Time Calculation (min) (ACUTE ONLY): 10 min  Past Medical History:  Past Medical History:  Diagnosis Date  . GERD (gastroesophageal reflux disease)   . Gout   . HLD (hyperlipidemia)   . HTN (hypertension)   . IDDM (insulin dependent diabetes mellitus) (HCC)    Past Surgical History: History reviewed. No pertinent surgical history. HPI:  75 y.o. male admitted s/p fall, found down after several hours. Found to have sepsis secondary to UTI, acute renal failure, demand ischemia vs MI. PMH: IDDM, HTN, solitary kidney, GERD   Assessment / Plan / Recommendation Clinical Impression  Pt has no overt signs of an oropharyngeal dysphagia, with symptoms appearing most consistent with esophageal issues in pt with known h/o GERD. He describes a globus sensation and burning upon eating mostly solids. Pt declined soft solids despite SLP encouragement due to reported difficulty. Per chart, MD appears to have changed his reflux medications yesterday. Recommend to initiate Dys 3 diet and thin liquids with reflux precautions reviewed with pt. Suspect pt will likely need more medical management of possible esophageal symptoms, but will f/u briefly for tolerance and implementation of strategies.    Aspiration Risk  Mild aspiration risk    Diet Recommendation Dysphagia 3 (Mech soft);Thin liquid   Liquid Administration via: Cup;Straw Medication Administration: Whole meds with puree Supervision: Patient able to self feed;Intermittent supervision to cue for compensatory strategies Compensations: Slow rate;Small sips/bites;Follow solids with liquid Postural Changes: Seated upright at 90 degrees;Remain upright for at least 30 minutes after po intake    Other  Recommendations Oral Care  Recommendations: Oral care BID   Follow up Recommendations None      Frequency and Duration min 1 x/week  1 week       Prognosis Prognosis for Safe Diet Advancement: Good      Swallow Study   General HPI: 75 y.o. male admitted s/p fall, found down after several hours. Found to have sepsis secondary to UTI, acute renal failure, demand ischemia vs MI. PMH: IDDM, HTN, solitary kidney, GERD Type of Study: Bedside Swallow Evaluation Previous Swallow Assessment: none in chart Diet Prior to this Study: NPO Temperature Spikes Noted: No Respiratory Status: Room air History of Recent Intubation: No Behavior/Cognition: Alert;Cooperative;Pleasant mood Oral Cavity Assessment: Within Functional Limits Oral Care Completed by SLP: No Oral Cavity - Dentition: Adequate natural dentition Vision: Functional for self-feeding Self-Feeding Abilities: Able to feed self Patient Positioning: Upright in bed Baseline Vocal Quality: Normal;Low vocal intensity    Oral/Motor/Sensory Function Overall Oral Motor/Sensory Function: Within functional limits   Ice Chips Ice chips: Not tested   Thin Liquid Thin Liquid: Within functional limits Presentation: Self Fed;Straw    Nectar Thick Nectar Thick Liquid: Not tested   Honey Thick Honey Thick Liquid: Not tested   Puree Puree: Within functional limits Presentation: Self Fed;Spoon   Solid   GO   Solid: Not tested (pt refused)        Maxcine Ham 06/04/2016,9:47 AM  Maxcine Ham, M.A. CCC-SLP [phone]

## 2016-06-04 NOTE — Progress Notes (Signed)
PROGRESS NOTE    Joshuea Poovey  FXT:[id number] DOB: [birth year] DOA: 05/26/2016 PCP: Pcp Not In System   Brief Narrative:  75 yo male with prolonged hospital stay, initially admitted with sepsis and rhabdomyolysis to the intensive care unit. At home patient was febrile, sustained a fall and was noted to be confused. On initial evaluation he was confused. Significant renal failure with worsening renal function, required HD. Sepsis has improved and renal function with slow recovery, holding on HD for now.   Assessment & Plan:   Principal Problem:   Sepsis (HCC) Active Problems:   IDDM (insulin dependent diabetes mellitus) (HCC)   HTN (hypertension)   AKI (acute kidney injury) (HCC)   Delirium   Rhabdomyolysis   Transaminitis   Diarrhea   Cholangitis   Cardiomyopathy (HCC)   Demand ischemia (HCC)   Bacteremia due to Escherichia coli   Urinary tract infection without hematuria   Acute systolic CHF (congestive heart failure) (HCC)   Pre-eclampsia superimposed on chronic hypertension, antepartum   1. Rhabdomyolysis with AKI. Clinically resolved. Cpk at 222. Renal function with improving cr and urine output. Last hemodialysis on  12/02. Over last 24 hours, 2925 cc urine output. Clinically euvolemic. K at 4.1 with Na at 133 and serum bicarb at 23.   2. Sepsis (severe) due to urine infection, e coli (present on admission). Patient on ceftriaxone #6/8,blood pressure stable with systolic at 117. Continue to hold on valsartan due to risk for worsening renal failure and hypotension.   3. Systolic heart failure acute. Hemodynamic stable, will continue close monitoring, clinically euvolemic, at home valsartan and aldactone. Echocardiogram with LV systolic function 50 to 55% with no wall motion abnormalities, improved from admission 40% with akinesis at the apex, suggesting stress induced cardiomyopathy.   4. T2DM/ Will continue glucose cover and monitoring, capillary glucose over last 24  hours, [phone]. Insulin galrgine 14 units bedtime and 8 aspart pre-meal.   5. Metabolic encephalopathy with delrium. Metabolic encephalopathy due to sepsis and urine infection, clinically has improved, continue physical therapy and neuro checks per unit protocol.  Swallow evaluation with recommendation for pureed diet.   6. GERD. Persistant and worsening reflux, will add sucralfate with meals and at night. Tolerating po well.      DVT prophylaxis: heparin  Code Status: full  Family Communication: I spoke with patient's daughter at the bedside and all questions were addressed.  Disposition Plan: home   Consultants:   Nephrology   Procedures:    Antimicrobials:       Subjective: Patient with burning pain on the precordium, moderate to severe in intensity, worse with meals, no improving factors, no associated nausea or vomiting.   Objective: Vitals:   06/03/16 1808 06/03/16 2142 06/04/16 0432 06/04/16 1025  BP: (!) 123/52 (!) 109/44 (!) 133/56 (!) 117/48  Pulse: 74 74 77 72  Resp: 18 18 17 17   Temp: 98.7 F (37.1 C) 97.7 F (36.5 C) 98.2 F (36.8 C) 98 F (36.7 C)  TempSrc: Oral Oral Oral Oral  SpO2: 97% 97% 95% 96%  Weight:  110.8 kg (244 lb 4.8 oz)    Height:        Intake/Output Summary (Last 24 hours) at 06/04/16 1139 Last data filed at 06/04/16 1027  Gross per 24 hour  Intake              540 ml  Output             2600 ml  Net            -  2060 ml   Filed Weights   06/01/16 2151 06/02/16 2100 06/03/16 2142  Weight: 119.9 kg (264 lb 5.3 oz) 120.1 kg (264 lb 12.4 oz) 110.8 kg (244 lb 4.8 oz)    Examination:  General exam: not in pain or dyspnea E ENT: mild pallor but no icterus Respiratory system: Clear to auscultation. Respiratory effort normal. No wheezing, rales or rhonchi.  Cardiovascular system: S1 & S2 heard, RRR. No JVD, murmurs, rubs, gallops or clicks. Trace  edema. Gastrointestinal system: Abdomen is nondistended, soft and nontender. No  organomegaly or masses felt. Normal bowel sounds heard. Central nervous system: Alert and oriented. No focal neurological deficits. Extremities: Symmetric 5 x 5 power. Skin: No rashes, lesions or ulcers  Data Reviewed: I have personally reviewed following labs and imaging studies  CBC:  Recent Labs Lab 05/29/16 1127 05/30/16 0504 06/01/16 0605 06/03/16 0500  WBC 11.5* 8.9 10.3 7.7  HGB 10.2* 10.6* 9.8* 9.7*  HCT 29.1* 30.5* 28.3* 28.1*  MCV 86.6 86.9 86.8 87.3  PLT 124* 147* 190 286   Basic Metabolic Panel:  Recent Labs Lab 05/29/16 0239 05/30/16 0504 05/31/16 0456 06/01/16 0605 06/02/16 0636 06/03/16 0500 06/04/16 0443  NA 134* 132* 132* 134* 133* 131* 133*  K 4.5 4.1 4.7 3.8 4.1 3.8 4.1  CL 95* 94* 93* 94* 92* 92* 96*  CO2 24 22 23 25  21* 24 23  GLUCOSE 199* 146* 117* 117* 129* 154* 209*  BUN 70* 88* 120* 98* 119* 127* 130*  CREATININE 7.54* 7.49* 9.09* 7.85* 8.86* 8.93* 8.45*  CALCIUM 8.2* 8.3* 8.2* 8.5* 8.7* 8.7* 8.9  MG 2.1 2.3 2.6* 2.2 2.2  --   --   PHOS 4.5 4.9* 7.1* 7.2* 7.9* 8.7* 8.2*   GFR: Estimated Creatinine Clearance: 10 mL/min (by C-G formula based on SCr of 8.45 mg/dL (H)). Liver Function Tests:  Recent Labs Lab 05/30/16 0504 05/31/16 0456 06/01/16 0605 06/02/16 0636 06/03/16 0500 06/04/16 0443  AST 160* 136*  --  104*  --   --   ALT 120* 122*  --  117*  --   --   ALKPHOS 110 120  --  110  --   --   BILITOT 0.8 2.0*  --  0.5  --   --   PROT 6.5 5.9*  --  6.4*  --   --   ALBUMIN 1.9* 1.9* 1.9* 2.2* 2.2* 2.2*   No results for input(s): LIPASE, AMYLASE in the last 168 hours. No results for input(s): AMMONIA in the last 168 hours. Coagulation Profile: No results for input(s): INR, PROTIME in the last 168 hours. Cardiac Enzymes:  Recent Labs Lab 05/29/16 0239 05/30/16 0504 06/01/16 0605 06/02/16 0636 06/04/16 0443  CKTOTAL 6,520* 2,989* 619* 484* 222   BNP (last 3 results) No results for input(s): PROBNP in the last 8760  hours. HbA1C: No results for input(s): HGBA1C in the last 72 hours. CBG:  Recent Labs Lab 06/03/16 1647 06/03/16 1956 06/04/16 0134 06/04/16 0436 06/04/16 0756  GLUCAP 154* 154* 259* 215* 178*   Lipid Profile: No results for input(s): CHOL, HDL, LDLCALC, TRIG, CHOLHDL, LDLDIRECT in the last 72 hours. Thyroid Function Tests: No results for input(s): TSH, T4TOTAL, FREET4, T3FREE, THYROIDAB in the last 72 hours. Anemia Panel: No results for input(s): VITAMINB12, FOLATE, FERRITIN, TIBC, IRON, RETICCTPCT in the last 72 hours. Sepsis Labs: No results for input(s): PROCALCITON, LATICACIDVEN in the last 168 hours.  Recent Results (from the past 240 hour(s))  Blood Culture (routine x  2)     Status: Abnormal   Collection Time: 05/26/16 11:55 PM  Result Value Ref Range Status   Specimen Description BLOOD LEFT ANTECUBITAL  Final   Special Requests BOTTLES DRAWN AEROBIC AND ANAEROBIC 5CC  Final   Culture  Setup Time   Final    GRAM NEGATIVE RODS IN BOTH AEROBIC AND ANAEROBIC BOTTLES Organism ID to follow CRITICAL RESULT CALLED TO, READ BACK BY AND VERIFIED WITH: N. Batchelder Pharm.D. 13:15 05/27/16  ([email or web address])    Culture ESCHERICHIA COLI (A)  Final   Report Status 05/29/2016 FINAL  Final   Organism ID, Bacteria ESCHERICHIA COLI  Final      Susceptibility   Escherichia coli - MIC*    AMPICILLIN <=2 SENSITIVE Sensitive     CEFAZOLIN <=4 SENSITIVE Sensitive     CEFEPIME <=1 SENSITIVE Sensitive     CEFTAZIDIME <=1 SENSITIVE Sensitive     CEFTRIAXONE <=1 SENSITIVE Sensitive     CIPROFLOXACIN <=0.25 SENSITIVE Sensitive     GENTAMICIN <=1 SENSITIVE Sensitive     IMIPENEM <=0.25 SENSITIVE Sensitive     TRIMETH/SULFA <=20 SENSITIVE Sensitive     AMPICILLIN/SULBACTAM <=2 SENSITIVE Sensitive     PIP/TAZO <=4 SENSITIVE Sensitive     Extended ESBL NEGATIVE Sensitive     * ESCHERICHIA COLI  Blood Culture ID Panel (Reflexed)     Status: Abnormal   Collection Time: 05/26/16 11:55 PM  Result  Value Ref Range Status   Enterococcus species NOT DETECTED NOT DETECTED Final   Listeria monocytogenes NOT DETECTED NOT DETECTED Final   Staphylococcus species NOT DETECTED NOT DETECTED Final   Staphylococcus aureus NOT DETECTED NOT DETECTED Final   Streptococcus species NOT DETECTED NOT DETECTED Final   Streptococcus agalactiae NOT DETECTED NOT DETECTED Final   Streptococcus pneumoniae NOT DETECTED NOT DETECTED Final   Streptococcus pyogenes NOT DETECTED NOT DETECTED Final   Acinetobacter baumannii NOT DETECTED NOT DETECTED Final   Enterobacteriaceae species DETECTED (A) NOT DETECTED Final    Comment: CRITICAL RESULT CALLED TO, READ BACK BY AND VERIFIED WITH: N. Batchelder Pharm.D. 13:15 05/27/16 ([email or web address])    Enterobacter cloacae complex NOT DETECTED NOT DETECTED Final   Escherichia coli DETECTED (A) NOT DETECTED Final    Comment: CRITICAL RESULT CALLED TO, READ BACK BY AND VERIFIED WITH: N. Batchelder Pharm.D. 13:15 05/27/16 ([email or web address])    Klebsiella oxytoca NOT DETECTED NOT DETECTED Final   Klebsiella pneumoniae NOT DETECTED NOT DETECTED Final   Proteus species NOT DETECTED NOT DETECTED Final   Serratia marcescens NOT DETECTED NOT DETECTED Final   Carbapenem resistance NOT DETECTED NOT DETECTED Final   Haemophilus influenzae NOT DETECTED NOT DETECTED Final   Neisseria meningitidis NOT DETECTED NOT DETECTED Final   Pseudomonas aeruginosa NOT DETECTED NOT DETECTED Final   Candida albicans NOT DETECTED NOT DETECTED Final   Candida glabrata NOT DETECTED NOT DETECTED Final   Candida krusei NOT DETECTED NOT DETECTED Final   Candida parapsilosis NOT DETECTED NOT DETECTED Final   Candida tropicalis NOT DETECTED NOT DETECTED Final  Blood Culture (routine x 2)     Status: Abnormal   Collection Time: 05/27/16 12:20 AM  Result Value Ref Range Status   Specimen Description BLOOD RIGHT HAND  Final   Special Requests IN PEDIATRIC BOTTLE 1CC  Final   Culture  Setup Time   Final    GRAM  NEGATIVE RODS AEROBIC BOTTLE ONLY CRITICAL RESULT CALLED TO, READ BACK BY AND VERIFIED WITH: N. Batchelder Pharm.D. 13:15 05/27/16  ([email or web address])  Culture (A)  Final    ESCHERICHIA COLI SUSCEPTIBILITIES PERFORMED ON PREVIOUS CULTURE WITHIN THE LAST 5 DAYS.    Report Status 05/29/2016 FINAL  Final  Urine culture     Status: Abnormal   Collection Time: 05/27/16  2:44 AM  Result Value Ref Range Status   Specimen Description URINE, CLEAN CATCH  Final   Special Requests NONE  Final   Culture >=100,000 COLONIES/mL ESCHERICHIA COLI (A)  Final   Report Status 05/29/2016 FINAL  Final   Organism ID, Bacteria ESCHERICHIA COLI (A)  Final      Susceptibility   Escherichia coli - MIC*    AMPICILLIN 4 SENSITIVE Sensitive     CEFAZOLIN <=4 SENSITIVE Sensitive     CEFTRIAXONE <=1 SENSITIVE Sensitive     CIPROFLOXACIN <=0.25 SENSITIVE Sensitive     GENTAMICIN <=1 SENSITIVE Sensitive     IMIPENEM <=0.25 SENSITIVE Sensitive     NITROFURANTOIN <=16 SENSITIVE Sensitive     TRIMETH/SULFA <=20 SENSITIVE Sensitive     AMPICILLIN/SULBACTAM <=2 SENSITIVE Sensitive     PIP/TAZO <=4 SENSITIVE Sensitive     Extended ESBL NEGATIVE Sensitive     * >=100,000 COLONIES/mL ESCHERICHIA COLI  MRSA PCR Screening     Status: None   Collection Time: 05/27/16  3:03 PM  Result Value Ref Range Status   MRSA by PCR NEGATIVE NEGATIVE Final    Comment:        The GeneXpert MRSA Assay (FDA approved for NASAL specimens only), is one component of a comprehensive MRSA colonization surveillance program. It is not intended to diagnose MRSA infection nor to guide or monitor treatment for MRSA infections.          Radiology Studies: No results found.      Scheduled Meds: . calcitRIOL  0.25 mcg Oral Daily  . calcium acetate  1,334 mg Oral TID WC  . cefTRIAXone (ROCEPHIN)  IV  2 g Intravenous Q24H  . heparin subcutaneous  5,000 Units Subcutaneous Q8H  . insulin aspart  0-20 Units Subcutaneous Q4H  . insulin  aspart  8 Units Subcutaneous TID WC  . insulin glargine  14 Units Subcutaneous Q24H  . multivitamin  1 tablet Oral QHS  . pantoprazole  40 mg Oral Daily  . sucralfate  1 g Oral TID WC & HS   Continuous Infusions:   LOS: 8 days        Mauricio Annett Gulaaniel Arrien, MD [facility] Pager [phone]  If 7PM-7AM, please contact night-coverage [email or web address] Password [email or web address] 06/04/2016, 11:39 AM

## 2016-06-04 NOTE — Progress Notes (Signed)
Physical Therapy Treatment Patient Details Name: Jonathan Waller MRN: [id number] DOB: 12/22/1940 Today's Date: 06/04/2016    History of Present Illness  Jonathan Waller is a 75 y.o. male with medical history significant of IDDM, HTN, solitary kidney.  Patient is in town visiting family for thanksgiving.  Patient was in usual state of health until 2 days ago when he started to develop generalized weakness, SOB, productive cough, urinary urgency.  Today fell twice.  Was down for up to a couple of hours after first episode.  No LOC.  Fever 101 at home.  Has been having diarrhea today.  Intermittent confusion for past 2 days (at baseline lives at home alone).  Patient brought to ED.    PT Comments    Generally steady overall with the RW, will try cane next session.   Follow Up Recommendations  No PT follow up     Equipment Recommendations  Cane (may need cane)    Recommendations for Other Services       Precautions / Restrictions Precautions Precautions: Fall    Mobility  Bed Mobility Overal bed mobility: Modified Independent             General bed mobility comments: Initially wanted help (just woke up).  Cued to roll to get up and pt needed no assist  Transfers Overall transfer level: Needs assistance   Transfers: Sit to/from Stand Sit to Stand: Min guard         General transfer comment: stood with control  Ambulation/Gait Ambulation/Gait assistance: Supervision Ambulation Distance (Feet): 240 Feet Assistive device: Rolling walker (2 wheeled) Gait Pattern/deviations: Step-through pattern   Gait velocity interpretation: at or above normal speed for age/gender General Gait Details: generally steady with RW overall will try cane next time   Stairs            Wheelchair Mobility    Modified Rankin (Stroke Patients Only)       Balance     Sitting balance-Leahy Scale: Good       Standing balance-Leahy Scale: Fair                      Cognition Arousal/Alertness: Awake/alert Behavior During Therapy: WFL for tasks assessed/performed Overall Cognitive Status: Within Functional Limits for tasks assessed                      Exercises      General Comments        Pertinent Vitals/Pain Pain Assessment: No/denies pain    Home Living                      Prior Function            PT Goals (current goals can now be found in the care plan section) Acute Rehab PT Goals Patient Stated Goal: get home as soon as I can PT Goal Formulation: With patient Time For Goal Achievement: 06/06/16 Potential to Achieve Goals: Good Progress towards PT goals: Progressing toward goals    Frequency    Min 3X/week      PT Plan Current plan remains appropriate    Co-evaluation             End of Session     Patient left: in chair;with call bell/phone within reach;with chair alarm set     Time: 8921-1941 PT Time Calculation (min) (ACUTE ONLY): 12 min  Charges:  $Gait Training: 8-22 mins  G CodesEliseo Gum:      [name] [name] 06/04/2016, 4:57 PM 06/04/2016  [place] BingKen Dante Cooter, PT [phone]  (pager)

## 2016-06-04 NOTE — Progress Notes (Signed)
Patient ID: Jonathan BartersJames Waller, male   DOB: Nov 01, 1940, 75 y.o.   MRN: [id number] S:Feels better today O:BP (!) 133/56 (BP Location: Left Arm)   Pulse 77   Temp 98.2 F (36.8 C) (Oral)   Resp 17   Ht 6\' 2"  (1.88 m)   Wt 110.8 kg (244 lb 4.8 oz)   SpO2 95%   BMI 31.37 kg/m   Intake/Output Summary (Last 24 hours) at 06/04/16 0909 Last data filed at 06/04/16 0440  Gross per 24 hour  Intake              942 ml  Output             2925 ml  Net            -1983 ml   Intake/Output: I/O last 3 completed shifts: In: 1182 [P.O.:1182] Out: 4150 [Urine:4150]  Intake/Output this shift:  No intake/output data recorded. Weight change: -9.286 kg (-20 lb 7.6 oz) Gen:WD AAM in NAD CVS:no rub Resp:cta NWG:NFAOZHAbd:benign YQM:VHQIONGExt:minimal edema   Recent Labs Lab 05/29/16 0239 05/30/16 0504 05/31/16 0456 06/01/16 0605 06/02/16 0636 06/03/16 0500 06/04/16 0443  NA 134* 132* 132* 134* 133* 131* 133*  K 4.5 4.1 4.7 3.8 4.1 3.8 4.1  CL 95* 94* 93* 94* 92* 92* 96*  CO2 24 22 23 25  21* 24 23  GLUCOSE 199* 146* 117* 117* 129* 154* 209*  BUN 70* 88* 120* 98* 119* 127* 130*  CREATININE 7.54* 7.49* 9.09* 7.85* 8.86* 8.93* 8.45*  ALBUMIN 2.1* 1.9* 1.9* 1.9* 2.2* 2.2* 2.2*  CALCIUM 8.2* 8.3* 8.2* 8.5* 8.7* 8.7* 8.9  PHOS 4.5 4.9* 7.1* 7.2* 7.9* 8.7* 8.2*  AST  --  160* 136*  --  104*  --   --   ALT  --  120* 122*  --  117*  --   --    Liver Function Tests:  Recent Labs Lab 05/30/16 0504 05/31/16 0456  06/02/16 0636 06/03/16 0500 06/04/16 0443  AST 160* 136*  --  104*  --   --   ALT 120* 122*  --  117*  --   --   ALKPHOS 110 120  --  110  --   --   BILITOT 0.8 2.0*  --  0.5  --   --   PROT 6.5 5.9*  --  6.4*  --   --   ALBUMIN 1.9* 1.9*  < > 2.2* 2.2* 2.2*  < > = values in this interval not displayed. No results for input(s): LIPASE, AMYLASE in the last 168 hours. No results for input(s): AMMONIA in the last 168 hours. CBC:  Recent Labs Lab 05/29/16 1127 05/30/16 0504 06/01/16 0605  06/03/16 0500  WBC 11.5* 8.9 10.3 7.7  HGB 10.2* 10.6* 9.8* 9.7*  HCT 29.1* 30.5* 28.3* 28.1*  MCV 86.6 86.9 86.8 87.3  PLT 124* 147* 190 286   Cardiac Enzymes:  Recent Labs Lab 05/29/16 0239 05/30/16 0504 06/01/16 0605 06/02/16 0636 06/04/16 0443  CKTOTAL 6,520* 2,989* 619* 484* 222   CBG:  Recent Labs Lab 06/03/16 1647 06/03/16 1956 06/04/16 0134 06/04/16 0436 06/04/16 0756  GLUCAP 154* 154* 259* 215* 178*    Iron Studies: No results for input(s): IRON, TIBC, TRANSFERRIN, FERRITIN in the last 72 hours. Studies/Results: No results found. . calcitRIOL  0.25 mcg Oral Daily  . calcium acetate  1,334 mg Oral TID WC  . cefTRIAXone (ROCEPHIN)  IV  2 g Intravenous Q24H  . heparin subcutaneous  5,000 Units  Subcutaneous Q8H  . insulin aspart  0-20 Units Subcutaneous Q4H  . insulin aspart  8 Units Subcutaneous TID WC  . insulin glargine  14 Units Subcutaneous Q24H  . multivitamin  1 tablet Oral QHS  . pantoprazole  40 mg Oral Daily    BMET    Component Value Date/Time   NA 133 (L) 06/04/2016 0443   K 4.1 06/04/2016 0443   CL 96 (L) 06/04/2016 0443   CO2 23 06/04/2016 0443   GLUCOSE 209 (H) 06/04/2016 0443   BUN 130 (H) 06/04/2016 0443   CREATININE 8.45 (H) 06/04/2016 0443   CALCIUM 8.9 06/04/2016 0443   GFRNONAA 5 (L) 06/04/2016 0443   GFRAA 6 (L) 06/04/2016 0443   CBC    Component Value Date/Time   WBC 7.7 06/03/2016 0500   RBC 3.22 (L) 06/03/2016 0500   HGB 9.7 (L) 06/03/2016 0500   HCT 28.1 (L) 06/03/2016 0500   PLT 286 06/03/2016 0500   MCV 87.3 06/03/2016 0500   MCH 30.1 06/03/2016 0500   MCHC 34.5 06/03/2016 0500   RDW 14.7 06/03/2016 0500   LYMPHSABS 0.5 (L) 05/28/2016 0634   MONOABS 1.8 (H) 05/28/2016 0634   EOSABS 0.0 05/28/2016 0634   BASOSABS 0.0 05/28/2016 0634     Assessment/Plan:  1. 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 Increased UOP. Last HD session 05/31/16.  1. Rate of rise in Scr had  slowed with marked improvement of UOP and now finally showing some improvement with Scr dropping to 8.45 2. Will hold off on HD for now and follow. Hopefully he is regaining some function 2. E coli sepsis 3. LV dysfunction- workup per Cardiology.  Holding off on cath until renal function recovers.  Recommending lexiscan myoview 4. DM- per primary 5. Anemia-  6. Thrombocytopenia- improving.  7. Abnormal LFT's- ?shock liver. Improving 8. acalculus cholecystitis-   Irena Cords, MD [facility] [phone]

## 2016-06-04 NOTE — Progress Notes (Signed)
Inpatient Diabetes Program Recommendations  AACE/ADA: New Consensus Statement on Inpatient Glycemic Control (2015)  Target Ranges:  Prepandial:   less than 140 mg/dL      Peak postprandial:   less than 180 mg/dL (1-2 hours)      Critically ill patients:  140 - 180 mg/dL   Lab Results  Component Value Date   GLUCAP 178 (H) 06/04/2016   HGBA1C 10.0 (H) 05/27/2016   Results for KYNDEL, GRIEPENTROG (MRN [id number]) as of 06/04/2016 08:54  Ref. Range 06/03/2016 12:06 06/03/2016 14:30 06/03/2016 16:47 06/03/2016 19:56 06/04/2016 01:34 06/04/2016 04:36 06/04/2016 07:56  Glucose-Capillary Latest Ref Range: 65 - 99 mg/dL 983 (H) 382 (H) 505 (H) 154 (H) 259 (H) 215 (H) 178 (H)   Review of Glycemic Control  Diabetes history:       DM2 Outpatient Diabetes medications:       70/30 0-20 units TID Current orders for Inpatient glycemic control:       Lantus 14 units Q24hours, Novolog 0-20 units Q4 hours, Novolog 8 units TIDAC meal coverage  Inpatient Diabetes Program Recommendations:      Glucose still elevated above inpatient goal of 140-180 mg/dl for FBG's at 3976 and 7341. No low CBG's noted.  Please consider increasing basal insulin to Lantus 18 units Q24hours.  Thank you,  Kristine Linea, RN, BSN Diabetes Coordinator Inpatient Diabetes Program [phone] (Team Pager)

## 2016-06-05 ENCOUNTER — Other Ambulatory Visit: Payer: Self-pay | Admitting: Cardiology

## 2016-06-05 DIAGNOSIS — R079 Chest pain, unspecified: Secondary | ICD-10-CM

## 2016-06-05 LAB — CBC
HCT: 29 % — ABNORMAL LOW (ref 39.0–52.0)
Hemoglobin: 9.7 g/dL — ABNORMAL LOW (ref 13.0–17.0)
MCH: 29.8 pg (ref 26.0–34.0)
MCHC: 33.4 g/dL (ref 30.0–36.0)
MCV: 89 fL (ref 78.0–100.0)
PLATELETS: 365 10*3/uL (ref 150–400)
RBC: 3.26 MIL/uL — AB (ref 4.22–5.81)
RDW: 15.1 % (ref 11.5–15.5)
WBC: 6.3 10*3/uL (ref 4.0–10.5)

## 2016-06-05 LAB — GLUCOSE, CAPILLARY
GLUCOSE-CAPILLARY: 146 mg/dL — AB (ref 65–99)
GLUCOSE-CAPILLARY: 159 mg/dL — AB (ref 65–99)
GLUCOSE-CAPILLARY: 186 mg/dL — AB (ref 65–99)
Glucose-Capillary: 159 mg/dL — ABNORMAL HIGH (ref 65–99)
Glucose-Capillary: 184 mg/dL — ABNORMAL HIGH (ref 65–99)
Glucose-Capillary: 252 mg/dL — ABNORMAL HIGH (ref 65–99)
Glucose-Capillary: 151 mg/dL — ABNORMAL HIGH (ref 65–99)

## 2016-06-05 LAB — RENAL FUNCTION PANEL
ALBUMIN: 2.4 g/dL — AB (ref 3.5–5.0)
Anion gap: 17 — ABNORMAL HIGH (ref 5–15)
BUN: 127 mg/dL — ABNORMAL HIGH (ref 6–20)
CALCIUM: 9.2 mg/dL (ref 8.9–10.3)
CO2: 22 mmol/L (ref 22–32)
CREATININE: 7.53 mg/dL — AB (ref 0.61–1.24)
Chloride: 97 mmol/L — ABNORMAL LOW (ref 101–111)
GFR calc non Af Amer: 6 mL/min — ABNORMAL LOW (ref >60)
GFR, EST AFRICAN AMERICAN: 7 mL/min — AB (ref >60)
Glucose, Bld: 178 mg/dL — ABNORMAL HIGH (ref 65–99)
PHOSPHORUS: 7.5 mg/dL — AB (ref 2.5–4.6)
Potassium: 4.4 mmol/L (ref 3.5–5.1)
SODIUM: 136 mmol/L (ref 135–145)

## 2016-06-05 MED ORDER — AMOXICILLIN-POT CLAVULANATE 500-125 MG PO TABS
1.0000 | ORAL_TABLET | Freq: Two times a day (BID) | ORAL | Status: DC
  Administered 2016-06-05 – 2016-06-06 (×3): 500 mg via ORAL
  Filled 2016-06-05 (×3): qty 1

## 2016-06-05 NOTE — Care Management Note (Addendum)
Case Management Note  Patient Details  Name: Jonathan Waller MRN: [id number] Date of Birth: 04-30-1941  Subjective/Objective:   AKI with rhabdo,  Sepsis, UTI, IDDM                Action/Plan: Discharge Planning:   NCM spoke to pt and gave permission to speak to dtr, [name] Course # [phone]. 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 Requesting RW for home. Contacted [facility] DME rep for RW. Contacted dtr via phone. States she will try to contact her PCP to arrange a PCP follow up appt for pt. States she plans to relocate pt to [place] closer to her. He will stay with her in her home. States she works from home and will be with pt to provide 24 hour assistance at home. Has a sister that can also assist with his care as needed.   PCP- Everlene Other NP  06/06/2016 1008 Contacted [facility] DME rep for RW. Offered choice for [facility]. Dtr states they know someone that works with [facility]. Requesting [facility] for HH. Contacted [facility] Liaison. Waiting HH PT orders with [facility]. Has appt with Everlene Other NP on 06/09/2016 at 915 am.   Expected Discharge Date:  06/06/2016               Expected Discharge Plan:  Home/Self Care  In-House Referral:  NA  Discharge planning Services  CM Consult  Post Acute Care Choice:  NA Choice offered to:  Patient  DME Arranged:  Dan Humphreys rolling DME Agency:  [facility]  HH Arranged:  NA HH Agency:  NA  Status of Service:  Completed, signed off  If discussed at Long Length of Stay Meetings, dates discussed:    Additional Comments:  Elliot Cousin, RN 06/05/2016, 3:51 PM

## 2016-06-05 NOTE — Progress Notes (Signed)
PROGRESS NOTE    Jonathan Waller  GUY:[id number] DOB: May 22, 1941 DOA: 05/26/2016 PCP: Pcp Not In System    Brief Narrative:  75 yo male with prolonged hospital stay, initially admitted with sepsis and rhabdomyolysis to the intensive care unit. At home patient was febrile, sustained a fall and was noted to be confused. On initial evaluation he was confused. Significant renal failure with worsening renal function, required HD. Sepsis has improved and renal function with slow recovery, holding on HD for now.    Assessment & Plan:   Principal Problem:   Sepsis (HCC) Active Problems:   IDDM (insulin dependent diabetes mellitus) (HCC)   HTN (hypertension)   AKI (acute kidney injury) (HCC)   Delirium   Rhabdomyolysis   Transaminitis   Diarrhea   Cholangitis   Cardiomyopathy (HCC)   Demand ischemia (HCC)   Bacteremia due to Escherichia coli   Urinary tract infection without hematuria   Acute systolic CHF (congestive heart failure) (HCC)   Pre-eclampsia superimposed on chronic hypertension, antepartum    1. Rhabdomyolysis with AKI.  Renal function continue to improve.  Last HD on  12/02. Over last 24 hours, 2025 cc urine output. Clinically euvolemic. K at 4.4 with Na at 136 and serum bicarb at 22, with cr down to 7,5 from  8,45. Continue phsolo. Phosphorus at 7,5 with target less than 5, Ca at 9.2, with albumin of 2,4, corrected ca at 10.6, will hold on calcitriol for now.   2. Sepsis (severe) due to urine infection, e coli (present on admission). Will transition antibiotic therapy to augementin for 2 more days. Will continue to hold on valsartan due to risk for worsening renal failure and hypotension.   3. Systolic heart failure acute/ stress induced cardiomyopathy. Will continue to hold on b blockade or ace inh due to risk of hypotension. LV systolic function improved from 40 to 50% . No signs of volume overload, since admission negative fluid balance at 10,476 ml.   4. T2DM/  Will continue glucose cover and monitoring, capillary glucose over last 24 hours, [phone] . Insulin galrgine 14 units bedtime and 8 aspart pre-meal. Tolerating po well.   5. Metabolic encephalopathy with delrium. Resolved metabolic encephalopathy. Continue physical therapy evaluation.  6. GERD. Persistant and worsening reflux, will add sucralfate with meals and at night. Tolerating po well. Continue supportive care.    DVT prophylaxis: heparin  Code Status: full  Family Communication: I spoke with patient's daughter at the bedside and all questions were addressed.  Disposition Plan: home   Consultants:   Nephrology   Procedures:    Antimicrobials:    Subjective: Patient with persistent epigastric and precordial burning sensation, improved with antiacid therapy only for short term, no nausea or vomiting, worsening symptoms when supine in bed.   Objective: Vitals:   06/04/16 1827 06/04/16 2309 06/05/16 0440 06/05/16 1020  BP: (!) 120/55 (!) 119/48 110/61 (!) 127/52  Pulse: 88 72 77 77  Resp: 17 18 18 18   Temp:  98 F (36.7 C) 98 F (36.7 C) 97.6 F (36.4 C)  TempSrc:  Oral Oral Oral  SpO2: 98% 99% 95% 97%  Weight:  110.3 kg (243 lb 2.7 oz)    Height:        Intake/Output Summary (Last 24 hours) at 06/05/16 1317 Last data filed at 06/05/16 1100  Gross per 24 hour  Intake              360 ml  Output  2375 ml  Net            -2015 ml   Filed Weights   06/02/16 2100 06/03/16 2142 06/04/16 2309  Weight: 120.1 kg (264 lb 12.4 oz) 110.8 kg (244 lb 4.8 oz) 110.3 kg (243 lb 2.7 oz)    Examination:  General exam: deconditioned, not in pain or dyspnea Respiratory system: Clear to auscultation. Respiratory effort normal. No wheezing, rales or rhonchi.  Cardiovascular system: S1 & S2 heard, RRR. No JVD, murmurs, rubs, gallops or clicks. Trace edema. Gastrointestinal system: Abdomen is nondistended, soft and nontender. No organomegaly or masses felt.  Normal bowel sounds heard. Central nervous system: Alert and oriented. No focal neurological deficits. Extremities: Symmetric 5 x 5 power. Skin: No rashes, lesions or ulcers  Data Reviewed: I have personally reviewed following labs and imaging studies  CBC:  Recent Labs Lab 05/30/16 0504 06/01/16 0605 06/03/16 0500 06/05/16 0508  WBC 8.9 10.3 7.7 6.3  HGB 10.6* 9.8* 9.7* 9.7*  HCT 30.5* 28.3* 28.1* 29.0*  MCV 86.9 86.8 87.3 89.0  PLT 147* 190 286 365   Basic Metabolic Panel:  Recent Labs Lab 05/30/16 0504 05/31/16 0456 06/01/16 0605 06/02/16 0636 06/03/16 0500 06/04/16 0443 06/05/16 0508  NA 132* 132* 134* 133* 131* 133* 136  K 4.1 4.7 3.8 4.1 3.8 4.1 4.4  CL 94* 93* 94* 92* 92* 96* 97*  CO2 22 23 25  21* 24 23 22   GLUCOSE 146* 117* 117* 129* 154* 209* 178*  BUN 88* 120* 98* 119* 127* 130* 127*  CREATININE 7.49* 9.09* 7.85* 8.86* 8.93* 8.45* 7.53*  CALCIUM 8.3* 8.2* 8.5* 8.7* 8.7* 8.9 9.2  MG 2.3 2.6* 2.2 2.2  --   --   --   PHOS 4.9* 7.1* 7.2* 7.9* 8.7* 8.2* 7.5*   GFR: Estimated Creatinine Clearance: 11.2 mL/min (by C-G formula based on SCr of 7.53 mg/dL (H)). Liver Function Tests:  Recent Labs Lab 05/30/16 0504 05/31/16 0456 06/01/16 0605 06/02/16 0636 06/03/16 0500 06/04/16 0443 06/05/16 0508  AST 160* 136*  --  104*  --   --   --   ALT 120* 122*  --  117*  --   --   --   ALKPHOS 110 120  --  110  --   --   --   BILITOT 0.8 2.0*  --  0.5  --   --   --   PROT 6.5 5.9*  --  6.4*  --   --   --   ALBUMIN 1.9* 1.9* 1.9* 2.2* 2.2* 2.2* 2.4*   No results for input(s): LIPASE, AMYLASE in the last 168 hours. No results for input(s): AMMONIA in the last 168 hours. Coagulation Profile: No results for input(s): INR, PROTIME in the last 168 hours. Cardiac Enzymes:  Recent Labs Lab 05/30/16 0504 06/01/16 0605 06/02/16 0636 06/04/16 0443  CKTOTAL 2,989* 619* 484* 222   BNP (last 3 results) No results for input(s): PROBNP in the last 8760  hours. HbA1C: No results for input(s): HGBA1C in the last 72 hours. CBG:  Recent Labs Lab 06/04/16 2027 06/05/16 0010 06/05/16 0434 06/05/16 0751 06/05/16 1202  GLUCAP 224* 159* 159* 184* 252*   Lipid Profile: No results for input(s): CHOL, HDL, LDLCALC, TRIG, CHOLHDL, LDLDIRECT in the last 72 hours. Thyroid Function Tests: No results for input(s): TSH, T4TOTAL, FREET4, T3FREE, THYROIDAB in the last 72 hours. Anemia Panel: No results for input(s): VITAMINB12, FOLATE, FERRITIN, TIBC, IRON, RETICCTPCT in the last 72 hours. Sepsis  Labs: No results for input(s): PROCALCITON, LATICACIDVEN in the last 168 hours.  Recent Results (from the past 240 hour(s))  Blood Culture (routine x 2)     Status: Abnormal   Collection Time: 05/26/16 11:55 PM  Result Value Ref Range Status   Specimen Description BLOOD LEFT ANTECUBITAL  Final   Special Requests BOTTLES DRAWN AEROBIC AND ANAEROBIC 5CC  Final   Culture  Setup Time   Final    GRAM NEGATIVE RODS IN BOTH AEROBIC AND ANAEROBIC BOTTLES Organism ID to follow CRITICAL RESULT CALLED TO, READ BACK BY AND VERIFIED WITH: N. Batchelder Pharm.D. 13:15 05/27/16  ([email or web address])    Culture ESCHERICHIA COLI (A)  Final   Report Status 05/29/2016 FINAL  Final   Organism ID, Bacteria ESCHERICHIA COLI  Final      Susceptibility   Escherichia coli - MIC*    AMPICILLIN <=2 SENSITIVE Sensitive     CEFAZOLIN <=4 SENSITIVE Sensitive     CEFEPIME <=1 SENSITIVE Sensitive     CEFTAZIDIME <=1 SENSITIVE Sensitive     CEFTRIAXONE <=1 SENSITIVE Sensitive     CIPROFLOXACIN <=0.25 SENSITIVE Sensitive     GENTAMICIN <=1 SENSITIVE Sensitive     IMIPENEM <=0.25 SENSITIVE Sensitive     TRIMETH/SULFA <=20 SENSITIVE Sensitive     AMPICILLIN/SULBACTAM <=2 SENSITIVE Sensitive     PIP/TAZO <=4 SENSITIVE Sensitive     Extended ESBL NEGATIVE Sensitive     * ESCHERICHIA COLI  Blood Culture ID Panel (Reflexed)     Status: Abnormal   Collection Time: 05/26/16 11:55 PM  Result  Value Ref Range Status   Enterococcus species NOT DETECTED NOT DETECTED Final   Listeria monocytogenes NOT DETECTED NOT DETECTED Final   Staphylococcus species NOT DETECTED NOT DETECTED Final   Staphylococcus aureus NOT DETECTED NOT DETECTED Final   Streptococcus species NOT DETECTED NOT DETECTED Final   Streptococcus agalactiae NOT DETECTED NOT DETECTED Final   Streptococcus pneumoniae NOT DETECTED NOT DETECTED Final   Streptococcus pyogenes NOT DETECTED NOT DETECTED Final   Acinetobacter baumannii NOT DETECTED NOT DETECTED Final   Enterobacteriaceae species DETECTED (A) NOT DETECTED Final    Comment: CRITICAL RESULT CALLED TO, READ BACK BY AND VERIFIED WITH: N. Batchelder Pharm.D. 13:15 05/27/16 ([email or web address])    Enterobacter cloacae complex NOT DETECTED NOT DETECTED Final   Escherichia coli DETECTED (A) NOT DETECTED Final    Comment: CRITICAL RESULT CALLED TO, READ BACK BY AND VERIFIED WITH: N. Batchelder Pharm.D. 13:15 05/27/16 ([email or web address])    Klebsiella oxytoca NOT DETECTED NOT DETECTED Final   Klebsiella pneumoniae NOT DETECTED NOT DETECTED Final   Proteus species NOT DETECTED NOT DETECTED Final   Serratia marcescens NOT DETECTED NOT DETECTED Final   Carbapenem resistance NOT DETECTED NOT DETECTED Final   Haemophilus influenzae NOT DETECTED NOT DETECTED Final   Neisseria meningitidis NOT DETECTED NOT DETECTED Final   Pseudomonas aeruginosa NOT DETECTED NOT DETECTED Final   Candida albicans NOT DETECTED NOT DETECTED Final   Candida glabrata NOT DETECTED NOT DETECTED Final   Candida krusei NOT DETECTED NOT DETECTED Final   Candida parapsilosis NOT DETECTED NOT DETECTED Final   Candida tropicalis NOT DETECTED NOT DETECTED Final  Blood Culture (routine x 2)     Status: Abnormal   Collection Time: 05/27/16 12:20 AM  Result Value Ref Range Status   Specimen Description BLOOD RIGHT HAND  Final   Special Requests IN PEDIATRIC BOTTLE 1CC  Final   Culture  Setup Time   Final    GRAM  NEGATIVE RODS AEROBIC BOTTLE ONLY CRITICAL RESULT CALLED TO, READ BACK BY AND VERIFIED WITH: N. Batchelder Pharm.D. 13:15 05/27/16  ([email or web address])    Culture (A)  Final    ESCHERICHIA COLI SUSCEPTIBILITIES PERFORMED ON PREVIOUS CULTURE WITHIN THE LAST 5 DAYS.    Report Status 05/29/2016 FINAL  Final  Urine culture     Status: Abnormal   Collection Time: 05/27/16  2:44 AM  Result Value Ref Range Status   Specimen Description URINE, CLEAN CATCH  Final   Special Requests NONE  Final   Culture >=100,000 COLONIES/mL ESCHERICHIA COLI (A)  Final   Report Status 05/29/2016 FINAL  Final   Organism ID, Bacteria ESCHERICHIA COLI (A)  Final      Susceptibility   Escherichia coli - MIC*    AMPICILLIN 4 SENSITIVE Sensitive     CEFAZOLIN <=4 SENSITIVE Sensitive     CEFTRIAXONE <=1 SENSITIVE Sensitive     CIPROFLOXACIN <=0.25 SENSITIVE Sensitive     GENTAMICIN <=1 SENSITIVE Sensitive     IMIPENEM <=0.25 SENSITIVE Sensitive     NITROFURANTOIN <=16 SENSITIVE Sensitive     TRIMETH/SULFA <=20 SENSITIVE Sensitive     AMPICILLIN/SULBACTAM <=2 SENSITIVE Sensitive     PIP/TAZO <=4 SENSITIVE Sensitive     Extended ESBL NEGATIVE Sensitive     * >=100,000 COLONIES/mL ESCHERICHIA COLI  MRSA PCR Screening     Status: None   Collection Time: 05/27/16  3:03 PM  Result Value Ref Range Status   MRSA by PCR NEGATIVE NEGATIVE Final    Comment:        The GeneXpert MRSA Assay (FDA approved for NASAL specimens only), is one component of a comprehensive MRSA colonization surveillance program. It is not intended to diagnose MRSA infection nor to guide or monitor treatment for MRSA infections.   Culture, blood (Routine X 2) w Reflex to ID Panel     Status: None (Preliminary result)   Collection Time: 06/03/16  5:50 AM  Result Value Ref Range Status   Specimen Description BLOOD BLOOD LEFT HAND  Final   Special Requests BOTTLES DRAWN AEROBIC ONLY 5CC  Final   Culture NO GROWTH 2 DAYS  Final   Report Status  PENDING  Incomplete  Culture, blood (Routine X 2) w Reflex to ID Panel     Status: None (Preliminary result)   Collection Time: 06/03/16  1:38 PM  Result Value Ref Range Status   Specimen Description BLOOD RIGHT ANTECUBITAL  Final   Special Requests BOTTLES DRAWN AEROBIC ONLY 5CC  Final   Culture NO GROWTH 2 DAYS  Final   Report Status PENDING  Incomplete         Radiology Studies: No results found.      Scheduled Meds: . amoxicillin-clavulanate  1 tablet Oral BID  . calcitRIOL  0.25 mcg Oral Daily  . calcium acetate  1,334 mg Oral TID WC  . heparin subcutaneous  5,000 Units Subcutaneous Q8H  . insulin aspart  0-20 Units Subcutaneous Q4H  . insulin aspart  8 Units Subcutaneous TID WC  . insulin glargine  14 Units Subcutaneous Q24H  . multivitamin  1 tablet Oral QHS  . pantoprazole  40 mg Oral Daily  . sucralfate  1 g Oral TID WC & HS   Continuous Infusions:   LOS: 9 days       Jonathan Lindon Annett Gulaaniel Beni Turrell, MD [facility] Pager [phone]  If 7PM-7AM, please contact night-coverage [email or web address] Password [email or web address] 06/05/2016, 1:17 PM

## 2016-06-05 NOTE — Progress Notes (Signed)
Patient ID: Jonathan Waller, male   DOB: [birth year], 75 y.o.   MRN: [id number] S:Feeling better O:BP 110/61 (BP Location: Right Arm)   Pulse 77   Temp 98 F (36.7 C) (Oral)   Resp 18   Ht 6\' 2"  (1.88 m)   Wt 110.3 kg (243 lb 2.7 oz)   SpO2 95%   BMI 31.22 kg/m   Intake/Output Summary (Last 24 hours) at 06/05/16 0851 Last data filed at 06/04/16 2313  Gross per 24 hour  Intake              780 ml  Output             2025 ml  Net            -1245 ml   Intake/Output: I/O last 3 completed shifts: In: 780 [P.O.:780] Out: 3075 [Urine:3075]  Intake/Output this shift:  No intake/output data recorded. Weight change: -0.514 kg (-1 lb 2.1 oz) Gen:WD WN AAM in NAD CVS:no rub Resp:cta Abd:+BS, soft, NT/ND Ext:no edema   Recent Labs Lab 05/30/16 0504 05/31/16 0456 06/01/16 0605 06/02/16 0636 06/03/16 0500 06/04/16 0443 06/05/16 0508  NA 132* 132* 134* 133* 131* 133* 136  K 4.1 4.7 3.8 4.1 3.8 4.1 4.4  CL 94* 93* 94* 92* 92* 96* 97*  CO2 22 23 25  21* 24 23 22   GLUCOSE 146* 117* 117* 129* 154* 209* 178*  BUN 88* 120* 98* 119* 127* 130* 127*  CREATININE 7.49* 9.09* 7.85* 8.86* 8.93* 8.45* 7.53*  ALBUMIN 1.9* 1.9* 1.9* 2.2* 2.2* 2.2* 2.4*  CALCIUM 8.3* 8.2* 8.5* 8.7* 8.7* 8.9 9.2  PHOS 4.9* 7.1* 7.2* 7.9* 8.7* 8.2* 7.5*  AST 160* 136*  --  104*  --   --   --   ALT 120* 122*  --  117*  --   --   --    Liver Function Tests:  Recent Labs Lab 05/30/16 0504 05/31/16 0456  06/02/16 0636 06/03/16 0500 06/04/16 0443 06/05/16 0508  AST 160* 136*  --  104*  --   --   --   ALT 120* 122*  --  117*  --   --   --   ALKPHOS 110 120  --  110  --   --   --   BILITOT 0.8 2.0*  --  0.5  --   --   --   PROT 6.5 5.9*  --  6.4*  --   --   --   ALBUMIN 1.9* 1.9*  < > 2.2* 2.2* 2.2* 2.4*  < > = values in this interval not displayed. No results for input(s): LIPASE, AMYLASE in the last 168 hours. No results for input(s): AMMONIA in the last 168 hours. CBC:  Recent Labs Lab  05/29/16 1127 05/30/16 0504 06/01/16 0605 06/03/16 0500 06/05/16 0508  WBC 11.5* 8.9 10.3 7.7 6.3  HGB 10.2* 10.6* 9.8* 9.7* 9.7*  HCT 29.1* 30.5* 28.3* 28.1* 29.0*  MCV 86.6 86.9 86.8 87.3 89.0  PLT 124* 147* 190 286 365   Cardiac Enzymes:  Recent Labs Lab 05/30/16 0504 06/01/16 0605 06/02/16 0636 06/04/16 0443  CKTOTAL 2,989* 619* 484* 222   CBG:  Recent Labs Lab 06/04/16 1710 06/04/16 2027 06/05/16 0010 06/05/16 0434 06/05/16 0751  GLUCAP 220* 224* 159* 159* 184*    Iron Studies: No results for input(s): IRON, TIBC, TRANSFERRIN, FERRITIN in the last 72 hours. Studies/Results: No results found. . calcitRIOL  0.25 mcg Oral Daily  . calcium acetate  1,334 mg Oral TID WC  . cefTRIAXone (ROCEPHIN)  IV  2 g Intravenous Q24H  . heparin subcutaneous  5,000 Units Subcutaneous Q8H  . insulin aspart  0-20 Units Subcutaneous Q4H  . insulin aspart  8 Units Subcutaneous TID WC  . insulin glargine  14 Units Subcutaneous Q24H  . multivitamin  1 tablet Oral QHS  . pantoprazole  40 mg Oral Daily  . sucralfate  1 g Oral TID WC & HS    BMET    Component Value Date/Time   NA 136 06/05/2016 0508   K 4.4 06/05/2016 0508   CL 97 (L) 06/05/2016 0508   CO2 22 06/05/2016 0508   GLUCOSE 178 (H) 06/05/2016 0508   BUN 127 (H) 06/05/2016 0508   CREATININE 7.53 (H) 06/05/2016 0508   CALCIUM 9.2 06/05/2016 0508   GFRNONAA 6 (L) 06/05/2016 0508   GFRAA 7 (L) 06/05/2016 0508   CBC    Component Value Date/Time   WBC 6.3 06/05/2016 0508   RBC 3.26 (L) 06/05/2016 0508   HGB 9.7 (L) 06/05/2016 0508   HCT 29.0 (L) 06/05/2016 0508   PLT 365 06/05/2016 0508   MCV 89.0 06/05/2016 0508   MCH 29.8 06/05/2016 0508   MCHC 33.4 06/05/2016 0508   RDW 15.1 06/05/2016 0508   LYMPHSABS 0.5 (L) 05/28/2016 0634   MONOABS 1.8 (H) 05/28/2016 0634   EOSABS 0.0 05/28/2016 0634   BASOSABS 0.0 05/28/2016 0634     Assessment/Plan:  1. AKI/CKD- solitary functioning kidney in setting of  rhabdo/sepsis/ARB therapy. Increased UOP. Last HD session 05/31/16.  1. Scr has finally started to show improvement as well as UOP  2. No indication for HD at this time Hopefully he is regaining some function 2. E coli sepsis 3. LV dysfunction- workup per Cardiology.  Holding off on cath until renal function recovers.  Recommending lexiscan myoview 4. DM- per primary 5. Anemia-  6. Thrombocytopenia- improving.  7. Abnormal LFT's- ?shock liver. Improving 8. acalculus cholecystitis-   Irena CordsJoseph A. Taronda Comacho, MD [facility] [phone]

## 2016-06-06 ENCOUNTER — Telehealth: Payer: Self-pay | Admitting: Family Medicine

## 2016-06-06 ENCOUNTER — Other Ambulatory Visit: Payer: Self-pay | Admitting: Cardiology

## 2016-06-06 DIAGNOSIS — I200 Unstable angina: Secondary | ICD-10-CM

## 2016-06-06 LAB — GLUCOSE, CAPILLARY
GLUCOSE-CAPILLARY: 138 mg/dL — AB (ref 65–99)
GLUCOSE-CAPILLARY: 174 mg/dL — AB (ref 65–99)
GLUCOSE-CAPILLARY: 213 mg/dL — AB (ref 65–99)
GLUCOSE-CAPILLARY: 173 mg/dL — AB (ref 65–99)
Glucose-Capillary: 181 mg/dL — ABNORMAL HIGH (ref 65–99)

## 2016-06-06 LAB — RENAL FUNCTION PANEL
ALBUMIN: 2.6 g/dL — AB (ref 3.5–5.0)
ANION GAP: 16 — AB (ref 5–15)
BUN: 119 mg/dL — AB (ref 6–20)
CHLORIDE: 98 mmol/L — AB (ref 101–111)
CO2: 22 mmol/L (ref 22–32)
Calcium: 9.6 mg/dL (ref 8.9–10.3)
Creatinine, Ser: 6.3 mg/dL — ABNORMAL HIGH (ref 0.61–1.24)
GFR, EST AFRICAN AMERICAN: 9 mL/min — AB (ref >60)
GFR, EST NON AFRICAN AMERICAN: 8 mL/min — AB (ref >60)
Glucose, Bld: 174 mg/dL — ABNORMAL HIGH (ref 65–99)
PHOSPHORUS: 6.2 mg/dL — AB (ref 2.5–4.6)
POTASSIUM: 4.5 mmol/L (ref 3.5–5.1)
Sodium: 136 mmol/L (ref 135–145)

## 2016-06-06 MED ORDER — INSULIN ASPART 100 UNIT/ML ~~LOC~~ SOLN: 0.0000 [IU] | SUBCUTANEOUS | 11 refills | Status: DC

## 2016-06-06 MED ORDER — RENA-VITE PO TABS: 1.0000 | ORAL_TABLET | Freq: Every day | ORAL | 0 refills | Status: DC

## 2016-06-06 MED ORDER — RENA-VITE PO TABS
1.0000 | ORAL_TABLET | Freq: Every day | ORAL | 0 refills | Status: AC
Start: 2016-06-06 — End: ?

## 2016-06-06 MED ORDER — INSULIN GLARGINE 100 UNIT/ML ~~LOC~~ SOLN: 14.0000 [IU] | SUBCUTANEOUS | 11 refills | Status: DC

## 2016-06-06 MED ORDER — CALCIUM ACETATE (PHOS BINDER) 667 MG PO CAPS: 1334.0000 mg | ORAL_CAPSULE | Freq: Three times a day (TID) | ORAL | 0 refills | Status: AC

## 2016-06-06 MED ORDER — CALCIUM ACETATE (PHOS BINDER) 667 MG PO CAPS: 1334.0000 mg | ORAL_CAPSULE | Freq: Three times a day (TID) | ORAL | 0 refills | Status: DC

## 2016-06-06 MED ORDER — PANTOPRAZOLE SODIUM 40 MG PO TBEC: 40.0000 mg | DELAYED_RELEASE_TABLET | Freq: Every day | ORAL | 0 refills | Status: DC

## 2016-06-06 MED ORDER — NYSTATIN-TRIAMCINOLONE 100000-0.1 UNIT/GM-% EX OINT: 1.0000 "application " | TOPICAL_OINTMENT | Freq: Two times a day (BID) | CUTANEOUS | 0 refills | Status: DC

## 2016-06-06 MED ORDER — NYSTATIN-TRIAMCINOLONE 100000-0.1 UNIT/GM-% EX OINT: 1.0000 "application " | TOPICAL_OINTMENT | Freq: Two times a day (BID) | CUTANEOUS | 0 refills | Status: AC

## 2016-06-06 NOTE — Progress Notes (Signed)
Patient able to void 200cc of light yellow clear urine.

## 2016-06-06 NOTE — Progress Notes (Signed)
Outpatient Lexiscan Myoview and follow up scheduled.

## 2016-06-06 NOTE — Discharge Summary (Addendum)
Physician Discharge Summary  Jonathan Waller VPX:[id number] DOB: Jan 16, 1941 DOA: 05/26/2016  PCP: Tommie Sams, DO  Admit date: 05/26/2016 Discharge date: 06/06/2016  Admitted From: Home  Disposition:  Home   Recommendations for Outpatient Follow-up:  1. Follow up with PCP on Monday December 11.  2. Please obtain BMP on Monday December 11 3. Please follow up on renal function and electrolytes  Home Health: Yes  Equipment/Devices: No   Discharge Condition: stable  CODE STATUS: full  Diet recommendation: Heart Healthy / Carb Modified   Brief/Interim Summary: This is a 75 year old male who presented to hospital with chief complaint of dyspnea and weakness. 2 days prior to hospitalization he developed generalized weakness, dyspnea, productive cough and urinary urgency. The day of admission he experienced a fall, he was unable to stand back on his feet. Symptoms were associated with confusion and intermittent diarrhea. On initial physical examination his blood pressure was 126/88, heart rate 117, respiratory rate 39, saturation 98%. His mucous membranes were moist, he was noted to have tachypnea, heart S1-S2 present rhythmic, abdomen was soft, extremities no edema. Positive for delirious. Sodium 126, potassium 4.9, BUN 50, creatinine 4.78, glucose 347, a C3 10, AST 101, BNP 190, CK 25,775. White count 10.3, hemoglobin 11.2, hematocrit 32.6, platelets 95. Urinalysis had 250 glucose, pH 6.5, protein more than 300, too numerous to count white cells, urinary sodium 80. Chest film was clear for infiltrates. EKG was sinus rhythm, left axis deviation with a right bundle branch block.  The patient was admitted to hospital with the working diagnosis of sepsis due to urinary tract infection, acute kidney injury, rhabdomyolysis and transaminitis.  1. Sepsis due to urinary tract infection, present on admission due to Escherichia coli, gram-negative bacteremia. Patient was placed on vancomycin and Zosyn  intravenously for broad-spectrum antibiotics plus isotonic saline. Telemetry monitoring. Echocardiogram showed ejection fraction 40% with akinesis of the inferior and apical myocardium. Blood and urine cultures came back positive for Escherichia coli, antibiotics were descalated to  ceftriaxone intravenously. Follow-up cultures are no growth 3 days. He completed his antibiotic therapy in the hospital. Ultrasonography of the liver showed changes suggestive of a calculus cholecystitis diagnosis that was posteriorly rule out. Patient did develop acute shock liver which improved with supportive care.  2. Acute kidney injury due to rhabdomyolysis. Patient was placed on supportive care, including IV bicarbonate, nephrotoxic agents were held. Suspected acute tubular necrosis, oliguric renal failure. Patient developed volume overload, transferred to the intensive care unit with respiratory failure, hypoxic, acute, placed on as needed BiPAP. On November 29 hemodialysis catheter was placed and patient underwent renal replacement therapy with ultrafiltration. On December 2 hemodialysis was held due to recovery of kidney function. Patient making more than a liter of urine daily, the last 24 hours 3.6 L. His creatinine is down to 6.3 with a potassium 4.5 and a serum bicarbonate 22. Patient will follow-up as an outpatient with nephrology. The presence of chronic kidney disease is clinically unable to be determined, until patient reaches a steady state GFR.  3. Acute systolic heart failure, stress-induced cardiomyopathy. Patient initial echocardiography show reduction on the LV function down to 40% with apical akinesis. Repeat echocardiogram on December 4, showed recovery of LV function of 50-55% with resolution of wall motion abnormalities.  4. Metabolic encephalopathy with delirium. Neurologic changes related to sepsis syndrome, clinically improved with supportive care, by the time of discharge patient awake and alert. He  was followed by physical therapy with good toleration.  5. Type  2 diabetes mellitus. Patient was placed on insulin regimen including sliding scale and basal insulin, pre-meal insulin. Capillary glucose over last 24 hours, 174, 181, 213.   6. GERD. Patient complained from acid reflux symptoms, patient was placed and antiacids with improvement of his symptoms.    Discharge Diagnoses:  Principal Problem:   Sepsis (HCC) Active Problems:   IDDM (insulin dependent diabetes mellitus) (HCC)   HTN (hypertension)   AKI (acute kidney injury) (HCC)   Delirium   Rhabdomyolysis   Transaminitis   Diarrhea   Cholangitis   Cardiomyopathy (HCC)   Demand ischemia (HCC)   Bacteremia due to Escherichia coli   Urinary tract infection without hematuria   Acute systolic CHF (congestive heart failure) (HCC)   Pre-eclampsia superimposed on chronic hypertension, antepartum    Discharge Instructions  Discharge Instructions    Diet - low sodium heart healthy    Complete by:  As directed    Discharge instructions    Complete by:  As directed    Follow with primary care as scheduled.   Increase activity slowly    Complete by:  As directed        Medication List    STOP taking these medications   amLODipine 5 MG tablet Commonly known as:  NORVASC   calcium carbonate 500 MG chewable tablet Commonly known as:  TUMS - dosed in mg elemental calcium   insulin aspart protamine- aspart (70-30) 100 UNIT/ML injection Commonly known as:  NOVOLOG MIX 70/30   OVER THE COUNTER MEDICATION   ranitidine 150 MG tablet Commonly known as:  ZANTAC   spironolactone 25 MG tablet Commonly known as:  ALDACTONE   valsartan 320 MG tablet Commonly known as:  DIOVAN   vardenafil 20 MG tablet Commonly known as:  LEVITRA     TAKE these medications   allopurinol 300 MG tablet Commonly known as:  ZYLOPRIM Take 300 mg by mouth daily.   calcium acetate 667 MG capsule Commonly known as:  PHOSLO Take 2  capsules (1,334 mg total) by mouth 3 (three) times daily with meals.   cetirizine 10 MG tablet Commonly known as:  ZYRTEC Take 10 mg by mouth daily.   insulin aspart 100 UNIT/ML injection Commonly known as:  novoLOG Inject 0-20 Units into the skin every 4 (four) hours. 150 to 200 -2 units, 201 to 250- 4 units, 251 to 300-6 units, 301 to 350- 8 units, 351 to 400- 10 units, greater than 450 ise 12 units.   insulin glargine 100 UNIT/ML injection Commonly known as:  LANTUS Inject 0.14 mLs (14 Units total) into the skin daily.   multivitamin Tabs tablet Take 1 tablet by mouth at bedtime.   nystatin-triamcinolone ointment Commonly known as:  MYCOLOG Apply 1 application topically 2 (two) times daily. Apply to groin area for 2 weeks, twice daily.   pantoprazole 40 MG tablet Commonly known as:  PROTONIX Take 1 tablet (40 mg total) by mouth daily. Start taking on:  06/07/2016   sildenafil 20 MG tablet Commonly known as:  REVATIO Take 60-100 mg by mouth daily as needed (ED).   simvastatin 20 MG tablet Commonly known as:  ZOCOR Take 20 mg by mouth daily.   tamsulosin 0.4 MG Caps capsule Commonly known as:  FLOMAX Take 0.4 mg by mouth daily.            Durable Medical Equipment        Start     Ordered   06/05/16 1546  For  home use only DME Walker rolling  Once    Question Answer Comment  Patient needs a walker to treat with the following condition Rhabdomyolysis   Patient needs a walker to treat with the following condition CHF (congestive heart failure) (HCC)      06/05/16 1547     Follow-up Information    Tommie Sams, DO Follow up.   Specialty:  Family Medicine Why:  appointment 06/09/2016 at 9:15 am Contact information: [address] [place] [address] [phone]        Peter [place]az[place], MD Follow up on 07/01/2016.   Specialty:  Cardiology Why:  at 3:30 PM   Contact information: [address] [place]  [address] [phone]        [facility] Follow up on 06/18/2016.   Specialty:  Cardiology Why:  this is for stress test that you lie on stretcher for.  be there at 1:00 PM for test.  no caffine for 2 days before and do not wear strong deodorant or cologne.  nothing to eat for 2 hours before the test.  the office will send instructions as well.  Contact information: [address] [place] [address] [phone]         Allergies  Allergen Reactions  . Aspirin Nausea And Vomiting     Consultations:  Nephrology    Procedures/Studies: Dg Chest 2 View  Result Date: 05/26/2016 CLINICAL DATA:  Generalized weakness for several weeks.  Dyspnea. EXAM: CHEST  2 VIEW COMPARISON:  None. FINDINGS: AP semi upright and lateral views are provided. Lung volumes are slightly low with mild interstitial edema. No pneumonic consolidation, significant pleural fluid collections or pneumothoraces. The cardiac silhouette is marginally enlarged but this could be due to the AP projection. The aorta is not aneurysmal. No acute osseous abnormality. IMPRESSION: Mild interstitial prominence which may reflect interstitial edema. Mild cardiomegaly but this could be due to the AP projection. Electronically Signed   By: Tollie Eth M.D.   On: 05/26/2016 23:00   US Renal  Result Date: 05/27/2016 CLINICAL DATA:  Status post right nephrectomy.  Acute kidney injury. EXAM: RENAL / URINARY TRACT ULTRASOUND COMPLETE COMPARISON:  None. FINDINGS: Right Kidney: The right kidney is surgically absent. The right renal fossa was scanned and unremarkable. Left Kidney: Length: 13.2 cm. There are multiple left renal cysts, the largest of which measures 4.8 x 4.8 x 4.8 cm, located near the lower pole. Bladder: The bladder was nondistended. IMPRESSION: 1. Status post right nephrectomy.  Unremarkable right renal fossa. 2. Multiple left renal cysts, but otherwise normal sonographic appearance of  the left kidney. Electronically Signed   By: Deatra Robinson M.D.   On: 05/27/2016 01:24   Dg Chest Portable 1 View  Result Date: 05/28/2016 CLINICAL DATA:  Placement of the right dialysis line EXAM: PORTABLE CHEST 1 VIEW COMPARISON:  05/27/2016 FINDINGS: Right side hemodialysis catheter tip is in the SVC. No pneumothorax. Heart is normal size. Mild vascular congestion. No confluent opacities or effusions. IMPRESSION: Right central line tip in the SVC.  No pneumothorax. Electronically Signed   By: Charlett Nose M.D.   On: 05/28/2016 10:02   Dg Chest Port 1 View  Result Date: 05/27/2016 CLINICAL DATA:  Shortness of breath EXAM: PORTABLE CHEST 1 VIEW COMPARISON:  05/26/2016 FINDINGS: Normal heart size with mild central vascular congestion. No focal pneumonia, collapse or consolidation. Negative for edema, effusion or pneumothorax. Trachea is midline. Atherosclerosis of the  aorta. No acute osseous finding. IMPRESSION: Mild central vascular congestion without CHF or focal pneumonia. Electronically Signed   By: Judie Petit.  Shick M.D.   On: 05/27/2016 17:09   US Abdomen Limited Ruq  Result Date: 05/27/2016 CLINICAL DATA:  Fever with right abdominal pain EXAM: US ABDOMEN LIMITED - RIGHT UPPER QUADRANT COMPARISON:  None. FINDINGS: Gallbladder: No gallstones are evident. The gallbladder wall appears thickened and edematous. Minimal pericholecystic fluid noted. No sonographic Murphy sign noted by sonographer; note, however, the patient was given pain medication prior to this study which could mask focal tenderness to interrogation. Common bile duct: Diameter: 6 mm. No intrahepatic or extrahepatic biliary duct dilatation. Liver: In the anterior segment of the right lobe of the liver, there is a 2.8 x 2.2 x 2.8 cm cystic area containing minimal septations. No other focal liver lesion evident. Within normal limits in parenchymal echogenicity. IMPRESSION: The appearance of the gallbladder is concerning for acalculus  cholecystitis. Cystic area containing minimal septations in the anterior segment right lobe of the liver. This area appears benign. No biliary duct dilatation evident. Electronically Signed   By: Bretta Bang III M.D.   On: 05/27/2016 09:49      Subjective: Patient feeling better, reflux has improved, no dyspnea or chest pain.   Discharge Exam: Vitals:   06/06/16 0407 06/06/16 0959  BP: 135/65 (!) 112/53  Pulse: 87 81  Resp: 20 18  Temp: 98.5 F (36.9 C) 97.7 F (36.5 C)   Vitals:   06/05/16 1619 06/05/16 2011 06/06/16 0407 06/06/16 0959  BP: 111/60 (!) 121/53 135/65 (!) 112/53  Pulse: 80 73 87 81  Resp: 18 19 20 18   Temp: 97.8 F (36.6 C) 98 F (36.7 C) 98.5 F (36.9 C) 97.7 F (36.5 C)  TempSrc: Oral Oral Oral Oral  SpO2: 97% 98% 99% 98%  Weight:      Height:        General: Pt is alert, awake, not in acute distress Cardiovascular: RRR, S1/S2 +, no rubs, no gallops Respiratory: CTA bilaterally, no wheezing, no rhonchi, mild decrease breath sounds at bases.  Abdominal: Soft, NT, ND, bowel sounds + Extremities: no edema, no cyanosis, trace edema    The results of significant diagnostics from this hospitalization (including imaging, microbiology, ancillary and laboratory) are listed below for reference.     Microbiology: Recent Results (from the past 240 hour(s))  MRSA PCR Screening     Status: None   Collection Time: 05/27/16  3:03 PM  Result Value Ref Range Status   MRSA by PCR NEGATIVE NEGATIVE Final    Comment:        The GeneXpert MRSA Assay (FDA approved for NASAL specimens only), is one component of a comprehensive MRSA colonization surveillance program. It is not intended to diagnose MRSA infection nor to guide or monitor treatment for MRSA infections.   Culture, blood (Routine X 2) w Reflex to ID Panel     Status: None (Preliminary result)   Collection Time: 06/03/16  5:50 AM  Result Value Ref Range Status   Specimen Description BLOOD  BLOOD LEFT HAND  Final   Special Requests BOTTLES DRAWN AEROBIC ONLY 5CC  Final   Culture NO GROWTH 3 DAYS  Final   Report Status PENDING  Incomplete  Culture, blood (Routine X 2) w Reflex to ID Panel     Status: None (Preliminary result)   Collection Time: 06/03/16  1:38 PM  Result Value Ref Range Status   Specimen Description BLOOD  RIGHT ANTECUBITAL  Final   Special Requests BOTTLES DRAWN AEROBIC ONLY 5CC  Final   Culture NO GROWTH 3 DAYS  Final   Report Status PENDING  Incomplete     Labs: BNP (last 3 results)  Recent Labs  05/26/16 2305  BNP 190.1*   Basic Metabolic Panel:  Recent Labs Lab 05/31/16 0456 06/01/16 0605 06/02/16 0636 06/03/16 0500 06/04/16 0443 06/05/16 0508 06/06/16 0455  NA 132* 134* 133* 131* 133* 136 136  K 4.7 3.8 4.1 3.8 4.1 4.4 4.5  CL 93* 94* 92* 92* 96* 97* 98*  CO2 23 25 21* 24 23 22 22   GLUCOSE 117* 117* 129* 154* 209* 178* 174*  BUN 120* 98* 119* 127* 130* 127* 119*  CREATININE 9.09* 7.85* 8.86* 8.93* 8.45* 7.53* 6.30*  CALCIUM 8.2* 8.5* 8.7* 8.7* 8.9 9.2 9.6  MG 2.6* 2.2 2.2  --   --   --   --   PHOS 7.1* 7.2* 7.9* 8.7* 8.2* 7.5* 6.2*   Liver Function Tests:  Recent Labs Lab 05/31/16 0456  06/02/16 0636 06/03/16 0500 06/04/16 0443 06/05/16 0508 06/06/16 0455  AST 136*  --  104*  --   --   --   --   ALT 122*  --  117*  --   --   --   --   ALKPHOS 120  --  110  --   --   --   --   BILITOT 2.0*  --  0.5  --   --   --   --   PROT 5.9*  --  6.4*  --   --   --   --   ALBUMIN 1.9*  < > 2.2* 2.2* 2.2* 2.4* 2.6*  < > = values in this interval not displayed. No results for input(s): LIPASE, AMYLASE in the last 168 hours. No results for input(s): AMMONIA in the last 168 hours. CBC:  Recent Labs Lab 06/01/16 0605 06/03/16 0500 06/05/16 0508  WBC 10.3 7.7 6.3  HGB 9.8* 9.7* 9.7*  HCT 28.3* 28.1* 29.0*  MCV 86.8 87.3 89.0  PLT 190 286 365   Cardiac Enzymes:  Recent Labs Lab 06/01/16 0605 06/02/16 0636 06/04/16 0443   CKTOTAL 619* 484* 222   BNP: Invalid input(s): POCBNP CBG:  Recent Labs Lab 06/05/16 2355 06/06/16 0245 06/06/16 0405 06/06/16 0804 06/06/16 1137  GLUCAP 146* 138* 174* 181* 213*   D-Dimer No results for input(s): DDIMER in the last 72 hours. Hgb A1c No results for input(s): HGBA1C in the last 72 hours. Lipid Profile No results for input(s): CHOL, HDL, LDLCALC, TRIG, CHOLHDL, LDLDIRECT in the last 72 hours. Thyroid function studies No results for input(s): TSH, T4TOTAL, T3FREE, THYROIDAB in the last 72 hours.  Invalid input(s): FREET3 Anemia work up No results for input(s): VITAMINB12, FOLATE, FERRITIN, TIBC, IRON, RETICCTPCT in the last 72 hours. Urinalysis    Component Value Date/Time   COLORURINE AMBER (A) 05/27/2016 0244   APPEARANCEUR TURBID (A) 05/27/2016 0244   LABSPEC 1.021 05/27/2016 0244   PHURINE 6.5 05/27/2016 0244   GLUCOSEU 250 (A) 05/27/2016 0244   HGBUR LARGE (A) 05/27/2016 0244   BILIRUBINUR SMALL (A) 05/27/2016 0244   KETONESUR 15 (A) 05/27/2016 0244   PROTEINUR >300 (A) 05/27/2016 0244   NITRITE NEGATIVE 05/27/2016 0244   LEUKOCYTESUR LARGE (A) 05/27/2016 0244   Sepsis Labs Invalid input(s): PROCALCITONIN,  WBC,  LACTICIDVEN Microbiology Recent Results (from the past 240 hour(s))  MRSA PCR Screening  Status: None   Collection Time: 05/27/16  3:03 PM  Result Value Ref Range Status   MRSA by PCR NEGATIVE NEGATIVE Final    Comment:        The GeneXpert MRSA Assay (FDA approved for NASAL specimens only), is one component of a comprehensive MRSA colonization surveillance program. It is not intended to diagnose MRSA infection nor to guide or monitor treatment for MRSA infections.   Culture, blood (Routine X 2) w Reflex to ID Panel     Status: None (Preliminary result)   Collection Time: 06/03/16  5:50 AM  Result Value Ref Range Status   Specimen Description BLOOD BLOOD LEFT HAND  Final   Special Requests BOTTLES DRAWN AEROBIC ONLY 5CC   Final   Culture NO GROWTH 3 DAYS  Final   Report Status PENDING  Incomplete  Culture, blood (Routine X 2) w Reflex to ID Panel     Status: None (Preliminary result)   Collection Time: 06/03/16  1:38 PM  Result Value Ref Range Status   Specimen Description BLOOD RIGHT ANTECUBITAL  Final   Special Requests BOTTLES DRAWN AEROBIC ONLY 5CC  Final   Culture NO GROWTH 3 DAYS  Final   Report Status PENDING  Incomplete     Time coordinating discharge: 45 minutes  SIGNED:   Coralie KeensMauricio Daniel Eunie Lawn, MD  [facility] 06/06/2016, 11:55 AM Pager   If 7PM-7AM, please contact night-coverage [email or web address] Password [email or web address]

## 2016-06-06 NOTE — Care Management Important Message (Signed)
Important Message  Patient Details  Name: Jonathan Waller MRN: [id number] Date of Birth: May 31, 1941   Medicare Important Message Given:  Yes    Amberlea Spagnuolo Stefan Church 06/06/2016, 12:48 PM

## 2016-06-06 NOTE — Telephone Encounter (Signed)
Pt daughter called to schedule a NP appt. Pt also is being discharged from hospital today. Pt is scheduled with Dr Adriana Simas on 06/09/16. Dx was sepsis and kidney failure. Thank you!

## 2016-06-06 NOTE — Progress Notes (Signed)
Physical Therapy Treatment Patient Details Name: Sherryl BartersJames Degrace MRN: [id number] DOB: 1941-01-05 Today's Date: 06/06/2016    History of Present Illness  Sherryl BartersJames Brull is a 75 y.o. male with medical history significant of IDDM, HTN, solitary kidney.  Patient is in town visiting family for thanksgiving.  Patient was in usual state of health until 2 days ago when he started to develop generalized weakness, SOB, productive cough, urinary urgency.  Today fell twice.  Was down for up to a couple of hours after first episode.  No LOC.  Fever 101 at home.  Has been having diarrhea today.  Intermittent confusion for past 2 days (at baseline lives at home alone).  Patient brought to ED.    PT Comments    Progressing well with RW or the cane.  Should be safe in a home-like environment with the cane.  RW for mornings when stiff and "bad days".   Follow Up Recommendations  Home health PT     Equipment Recommendations  Rolling walker with 5" wheels;Other (comment) (pt will purchase the cane)    Recommendations for Other Services       Precautions / Restrictions Precautions Precautions: Fall Restrictions Weight Bearing Restrictions: No    Mobility  Bed Mobility Overal bed mobility: Modified Independent             General bed mobility comments: minor use of the rail  Transfers Overall transfer level: Needs assistance Equipment used: None Transfers: Sit to/from Stand Sit to Stand: Supervision         General transfer comment: cues for standing safely with cane  Ambulation/Gait Ambulation/Gait assistance: Supervision Ambulation Distance (Feet): 500 Feet Assistive device: Straight cane Gait Pattern/deviations: Step-through pattern Gait velocity: age appropriate with use of the cane Gait velocity interpretation: at or above normal speed for age/gender General Gait Details: steady with appropriate/safe use of the cane.     Stairs Stairs: Yes   Stair Management: One rail  Right;With cane;Alternating pattern;Forwards Number of Stairs: 10 General stair comments: safe with the cane.  cues for better sequencing with the cane  Wheelchair Mobility    Modified Rankin (Stroke Patients Only)       Balance Overall balance assessment: Needs assistance   Sitting balance-Leahy Scale: Good     Standing balance support: No upper extremity supported;Single extremity supported Standing balance-Leahy Scale: Fair                      Cognition Arousal/Alertness: Awake/alert Behavior During Therapy: WFL for tasks assessed/performed Overall Cognitive Status: Within Functional Limits for tasks assessed                      Exercises      General Comments        Pertinent Vitals/Pain Pain Assessment: Faces Faces Pain Scale: No hurt    Home Living                      Prior Function            PT Goals (current goals can now be found in the care plan section) Acute Rehab PT Goals Patient Stated Goal: get home as soon as I can PT Goal Formulation: With patient Time For Goal Achievement: 06/06/16 Potential to Achieve Goals: Good Progress towards PT goals: Progressing toward goals    Frequency    Min 3X/week      PT Plan Current plan remains appropriate  Co-evaluation             End of Session   Activity Tolerance: Patient tolerated treatment well Patient left: in chair;with call bell/phone within reach;with family/visitor present     Time: 1208-1229 PT Time Calculation (min) (ACUTE ONLY): 21 min  Charges:  $Gait Training: 8-22 mins                    G CodesEliseo Gum Jordann Grime 06/06/2016, 1:54 PM 06/06/2016  [place], PT [phone]  (pager)

## 2016-06-06 NOTE — Progress Notes (Addendum)
Patient ID: Jonathan Waller, male   DOB: 1941/03/10, 75 y.o.   MRN: [id number] S:feeling better O:BP 135/65 (BP Location: Right Arm)   Pulse 87   Temp 98.5 F (36.9 C) (Oral)   Resp 20   Ht 6\' 2"  (1.88 m)   Wt 110.3 kg (243 lb 2.7 oz)   SpO2 99%   BMI 31.22 kg/m   Intake/Output Summary (Last 24 hours) at 06/06/16 0858 Last data filed at 06/06/16 0600  Gross per 24 hour  Intake              600 ml  Output             3600 ml  Net            -3000 ml   Intake/Output: I/O last 3 completed shifts: In: 600 [P.O.:600] Out: 4125 [Urine:4125]  Intake/Output this shift:  No intake/output data recorded. Weight change:  Gen:WD WN AAM in NAD CVS:no rub Resp:cta XOV:ANVBTY Ext:no edema   Recent Labs Lab 05/31/16 0456 06/01/16 0605 06/02/16 0636 06/03/16 0500 06/04/16 0443 06/05/16 0508 06/06/16 0455  NA 132* 134* 133* 131* 133* 136 136  K 4.7 3.8 4.1 3.8 4.1 4.4 4.5  CL 93* 94* 92* 92* 96* 97* 98*  CO2 23 25 21* 24 23 22 22   GLUCOSE 117* 117* 129* 154* 209* 178* 174*  BUN 120* 98* 119* 127* 130* 127* 119*  CREATININE 9.09* 7.85* 8.86* 8.93* 8.45* 7.53* 6.30*  ALBUMIN 1.9* 1.9* 2.2* 2.2* 2.2* 2.4* 2.6*  CALCIUM 8.2* 8.5* 8.7* 8.7* 8.9 9.2 9.6  PHOS 7.1* 7.2* 7.9* 8.7* 8.2* 7.5* 6.2*  AST 136*  --  104*  --   --   --   --   ALT 122*  --  117*  --   --   --   --    Liver Function Tests:  Recent Labs Lab 05/31/16 0456  06/02/16 0636  06/04/16 0443 06/05/16 0508 06/06/16 0455  AST 136*  --  104*  --   --   --   --   ALT 122*  --  117*  --   --   --   --   ALKPHOS 120  --  110  --   --   --   --   BILITOT 2.0*  --  0.5  --   --   --   --   PROT 5.9*  --  6.4*  --   --   --   --   ALBUMIN 1.9*  < > 2.2*  < > 2.2* 2.4* 2.6*  < > = values in this interval not displayed. No results for input(s): LIPASE, AMYLASE in the last 168 hours. No results for input(s): AMMONIA in the last 168 hours. CBC:  Recent Labs Lab 06/01/16 0605 06/03/16 0500 06/05/16 0508  WBC  10.3 7.7 6.3  HGB 9.8* 9.7* 9.7*  HCT 28.3* 28.1* 29.0*  MCV 86.8 87.3 89.0  PLT 190 286 365   Cardiac Enzymes:  Recent Labs Lab 06/01/16 0605 06/02/16 0636 06/04/16 0443  CKTOTAL 619* 484* 222   CBG:  Recent Labs Lab 06/05/16 2010 06/05/16 2355 06/06/16 0245 06/06/16 0405 06/06/16 0804  GLUCAP 151* 146* 138* 174* 181*    Iron Studies: No results for input(s): IRON, TIBC, TRANSFERRIN, FERRITIN in the last 72 hours. Studies/Results: No results found. Marland Kitchen amoxicillin-clavulanate  1 tablet Oral BID  . calcium acetate  1,334 mg Oral TID WC  . heparin subcutaneous  5,000 Units Subcutaneous Q8H  . insulin aspart  0-20 Units Subcutaneous Q4H  . insulin aspart  8 Units Subcutaneous TID WC  . insulin glargine  14 Units Subcutaneous Q24H  . multivitamin  1 tablet Oral QHS  . pantoprazole  40 mg Oral Daily  . sucralfate  1 g Oral TID WC & HS    BMET    Component Value Date/Time   NA 136 06/06/2016 0455   K 4.5 06/06/2016 0455   CL 98 (L) 06/06/2016 0455   CO2 22 06/06/2016 0455   GLUCOSE 174 (H) 06/06/2016 0455   BUN 119 (H) 06/06/2016 0455   CREATININE 6.30 (H) 06/06/2016 0455   CALCIUM 9.6 06/06/2016 0455   GFRNONAA 8 (L) 06/06/2016 0455   GFRAA 9 (L) 06/06/2016 0455   CBC    Component Value Date/Time   WBC 6.3 06/05/2016 0508   RBC 3.26 (L) 06/05/2016 0508   HGB 9.7 (L) 06/05/2016 0508   HCT 29.0 (L) 06/05/2016 0508   PLT 365 06/05/2016 0508   MCV 89.0 06/05/2016 0508   MCH 29.8 06/05/2016 0508   MCHC 33.4 06/05/2016 0508   RDW 15.1 06/05/2016 0508   LYMPHSABS 0.5 (L) 05/28/2016 0634   MONOABS 1.8 (H) 05/28/2016 0634   EOSABS 0.0 05/28/2016 0634   BASOSABS 0.0 05/28/2016 0634     Assessment/Plan:  1. 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 Increased UOP. Last HD session 05/31/16.  1. Scr has continued to show improvement as well as UOP  2. Thankfully he is regaining renal function 3. Will need to follow his  Scr weekly  4. Can f/u with our office if [place] stays in [place] or f/u with his primary nephrologist in [place] if he is going home 5. Will need to d/c foley and make sure he can void once removed.  2. E coli sepsis 3. LV dysfunction- workup per Cardiology. Holding off on cath until renal function recovers. Recommending lexiscan myoview 4. DM- per primary 5. Anemia-  6. Thrombocytopenia- improving.  7. Abnormal LFT's- ?shock liver. Improving 8. acalculus cholecystitis-  9. Disposition- Jonathan Waller has an appointment with our practice [facility] at [address], [place], on 06/12/16 at 11:00  Irena CordsJoseph A. Allysa Governale, MD [facility] [phone]

## 2016-06-06 NOTE — Progress Notes (Signed)
Patient Discharge: Disposition: Patient discharged to daughters home as daughter will be caring for him till he feels better. Education: Reviewed all his medications, prescriptions, follow-up appointment and discharge instructions, understood and acknowledged. IV: Discontinued IV before discharge. Telemetry: Discontinued Tele before discharge, CCMD notified. Transportation: Patient escorted out of unit in w/c. Belongings: Patient's daughter took all his belongings with her.

## 2016-06-06 NOTE — Discharge Instructions (Signed)
You are scheduled for a stress test at Dr. Elvis Coil office on 12.20/17 at 1:00 PM..  Office should send instructions.    You will then follow up with Dr. Swaziland on 07/02/15 at 3:30 PM

## 2016-06-06 NOTE — Telephone Encounter (Signed)
Attempted a call to daughters number, left a VM.

## 2016-06-08 DIAGNOSIS — E119 Type 2 diabetes mellitus without complications: Secondary | ICD-10-CM | POA: Diagnosis not present

## 2016-06-08 DIAGNOSIS — Q600 Renal agenesis, unilateral: Secondary | ICD-10-CM | POA: Diagnosis not present

## 2016-06-08 DIAGNOSIS — D649 Anemia, unspecified: Secondary | ICD-10-CM | POA: Diagnosis not present

## 2016-06-08 DIAGNOSIS — M6282 Rhabdomyolysis: Secondary | ICD-10-CM | POA: Diagnosis not present

## 2016-06-08 DIAGNOSIS — N179 Acute kidney failure, unspecified: Secondary | ICD-10-CM | POA: Diagnosis not present

## 2016-06-08 DIAGNOSIS — Z794 Long term (current) use of insulin: Secondary | ICD-10-CM | POA: Diagnosis not present

## 2016-06-08 DIAGNOSIS — I5021 Acute systolic (congestive) heart failure: Secondary | ICD-10-CM | POA: Diagnosis not present

## 2016-06-08 DIAGNOSIS — I429 Cardiomyopathy, unspecified: Secondary | ICD-10-CM | POA: Diagnosis not present

## 2016-06-08 DIAGNOSIS — Z87440 Personal history of urinary (tract) infections: Secondary | ICD-10-CM | POA: Diagnosis not present

## 2016-06-08 DIAGNOSIS — I110 Hypertensive heart disease with heart failure: Secondary | ICD-10-CM | POA: Diagnosis not present

## 2016-06-08 DIAGNOSIS — K219 Gastro-esophageal reflux disease without esophagitis: Secondary | ICD-10-CM | POA: Diagnosis not present

## 2016-06-08 DIAGNOSIS — M109 Gout, unspecified: Secondary | ICD-10-CM | POA: Diagnosis not present

## 2016-06-08 LAB — CULTURE, BLOOD (ROUTINE X 2)
CULTURE: NO GROWTH
Culture: NO GROWTH

## 2016-06-09 ENCOUNTER — Ambulatory Visit (INDEPENDENT_AMBULATORY_CARE_PROVIDER_SITE_OTHER): Payer: Medicare Other | Admitting: Family Medicine

## 2016-06-09 ENCOUNTER — Telehealth: Payer: Self-pay

## 2016-06-09 ENCOUNTER — Encounter: Payer: Self-pay | Admitting: Family Medicine

## 2016-06-09 VITALS — BP 101/62 | HR 115 | Temp 97.7°F | Resp 16 | Ht 72.0 in | Wt 238.5 lb

## 2016-06-09 DIAGNOSIS — R6520 Severe sepsis without septic shock: Secondary | ICD-10-CM | POA: Diagnosis not present

## 2016-06-09 DIAGNOSIS — D649 Anemia, unspecified: Secondary | ICD-10-CM | POA: Diagnosis not present

## 2016-06-09 DIAGNOSIS — Z794 Long term (current) use of insulin: Secondary | ICD-10-CM

## 2016-06-09 DIAGNOSIS — N170 Acute kidney failure with tubular necrosis: Secondary | ICD-10-CM | POA: Diagnosis not present

## 2016-06-09 DIAGNOSIS — I429 Cardiomyopathy, unspecified: Secondary | ICD-10-CM

## 2016-06-09 DIAGNOSIS — I200 Unstable angina: Secondary | ICD-10-CM | POA: Diagnosis not present

## 2016-06-09 DIAGNOSIS — I10 Essential (primary) hypertension: Secondary | ICD-10-CM

## 2016-06-09 DIAGNOSIS — E785 Hyperlipidemia, unspecified: Secondary | ICD-10-CM | POA: Diagnosis not present

## 2016-06-09 DIAGNOSIS — IMO0002 Reserved for concepts with insufficient information to code with codable children: Secondary | ICD-10-CM

## 2016-06-09 DIAGNOSIS — E1165 Type 2 diabetes mellitus with hyperglycemia: Secondary | ICD-10-CM

## 2016-06-09 DIAGNOSIS — A419 Sepsis, unspecified organism: Secondary | ICD-10-CM

## 2016-06-09 DIAGNOSIS — Z8739 Personal history of other diseases of the musculoskeletal system and connective tissue: Secondary | ICD-10-CM

## 2016-06-09 DIAGNOSIS — E118 Type 2 diabetes mellitus with unspecified complications: Secondary | ICD-10-CM

## 2016-06-09 LAB — COMPREHENSIVE METABOLIC PANEL
ALT: 60 U/L — AB (ref 0–53)
AST: 28 U/L (ref 0–37)
Albumin: 3.7 g/dL (ref 3.5–5.2)
Alkaline Phosphatase: 73 U/L (ref 39–117)
BILIRUBIN TOTAL: 0.4 mg/dL (ref 0.2–1.2)
BUN: 94 mg/dL (ref 6–23)
CHLORIDE: 99 meq/L (ref 96–112)
CO2: 24 meq/L (ref 19–32)
Calcium: 10.2 mg/dL (ref 8.4–10.5)
Creatinine, Ser: 3.95 mg/dL — ABNORMAL HIGH (ref 0.40–1.50)
GFR: 19.19 mL/min — AB (ref >60.00)
GLUCOSE: 267 mg/dL — AB (ref 70–99)
POTASSIUM: 4.9 meq/L (ref 3.5–5.1)
Sodium: 135 mEq/L (ref 135–145)
Total Protein: 8.6 g/dL — ABNORMAL HIGH (ref 6.0–8.3)

## 2016-06-09 LAB — CBC
HCT: 34 % — ABNORMAL LOW (ref 39.0–52.0)
HEMOGLOBIN: 11.3 g/dL — AB (ref 13.0–17.0)
MCHC: 33.4 g/dL (ref 30.0–36.0)
MCV: 90.8 fl (ref 78.0–100.0)
Platelets: 400 10*3/uL (ref 150.0–400.0)
RBC: 3.74 Mil/uL — ABNORMAL LOW (ref 4.22–5.81)
RDW: 16.1 % — AB (ref 11.5–15.5)
WBC: 5.7 10*3/uL (ref 4.0–10.5)

## 2016-06-09 LAB — LIPID PANEL
CHOL/HDL RATIO: 3
Cholesterol: 114 mg/dL (ref 0–200)
HDL: 38.7 mg/dL — AB (ref >39.00)
LDL CALC: 42 mg/dL (ref 0–99)
NONHDL: 75.75
TRIGLYCERIDES: 171 mg/dL — AB (ref 0.0–149.0)
VLDL: 34.2 mg/dL (ref 0.0–40.0)

## 2016-06-09 MED ORDER — ALBUTEROL SULFATE (2.5 MG/3ML) 0.083% IN NEBU: 2.5000 mg | INHALATION_SOLUTION | Freq: Four times a day (QID) | RESPIRATORY_TRACT | 1 refills | Status: AC | PRN

## 2016-06-09 MED ORDER — "INSULIN SYRINGE-NEEDLE U-100 25G X 5/8"" 1 ML MISC": 0 refills | Status: DC

## 2016-06-09 MED ORDER — "INSULIN SYRINGE-NEEDLE U-100 25G X 5/8"" 1 ML MISC": 4 refills | Status: DC

## 2016-06-09 NOTE — Telephone Encounter (Signed)
Noted  

## 2016-06-09 NOTE — Assessment & Plan Note (Addendum)
At goal. Continue Simvastatin (ALT only mildly elevated).

## 2016-06-09 NOTE — Telephone Encounter (Signed)
TCM was not completed as patient was not able to be reach prior to appt for call. thanks

## 2016-06-09 NOTE — Progress Notes (Signed)
Subjective:  Patient ID: Jonathan Waller, male    DOB: 1941-05-15  Age: 75 y.o. MRN: [id number]  CC: Establish care (several issues - see below).  HPI Jonathan Waller is a 75 y.o. male presents to the clinic today to establish care. Issues are below.  Sepsis, secondary to E coli UTI  Doing well at this time.  Does have some urgency but no other urinary complaints.  Acute renal Failure due to rhabdo and severe sepsis  HD during admission.  Has renal follow up.  Creatinine on D/C was 6.3.  Needs labs today.   Cardiomyopathy  Noted during admission.  Repeat Echo with improved EF.  Has cardiology follow up.  Needs stress test.   HTN  Well controlled.  Was taken off meds due to ARF.  BP at goal today.  HLD  Unsure of control.  Lipid panel today.  DM-2  Uncontrolled. Most recent A1c was 10.  He is currently taking sliding scale insulin and Lantus 14 units daily. He is taking approximately 2-4 units with sliding scale 4 times daily.  No reports of hypoglycemia.  PMH, Surgical Hx, Family Hx, Social History reviewed and updated as below.  Past Medical History:  Diagnosis Date  . BPH (benign prostatic hyperplasia)   . CKD (chronic kidney disease)   . GERD (gastroesophageal reflux disease)   . Gout   . HLD (hyperlipidemia)   . HTN (hypertension)   . IDDM (insulin dependent diabetes mellitus) ([place])    Past Surgical History:  Procedure Laterality Date  . BACK SURGERY    . NEPHRECTOMY     Family History  Problem Relation Age of Onset  . Diabetes Mother   . Hypertension Mother   . Arthritis Mother    Social History  Substance Use Topics  . Smoking status: Never Smoker  . Smokeless tobacco: Never Used  . Alcohol use No   Review of Systems  Constitutional: Positive for fatigue.  Respiratory: Positive for shortness of breath.   Genitourinary: Positive for urgency.  All other systems reviewed and are negative.  Objective:   Today's Vitals:  BP 101/62 (BP Location: Left Arm, Patient Position: Sitting, Cuff Size: Normal)   Pulse (!) 115   Temp 97.7 F (36.5 C) (Oral)   Resp 16   Ht 6' (1.829 m) Comment: with shoes  Wt 238 lb 8 oz (108.2 kg)   SpO2 98%   BMI 32.35 kg/m   Physical Exam  Constitutional: He is oriented to person, place, and time.  Chronically ill-appearing male in no acute distress.  HENT:  Head: Normocephalic and atraumatic.  Dry mucous membranes.   Eyes: Conjunctivae are normal. Pupils are equal, round, and reactive to light.  Neck: Neck supple.  Cardiovascular: Regular rhythm.  Tachycardia present.   No murmur heard. Pulmonary/Chest: Effort normal. He has no wheezes. He has no rales.  Abdominal: Soft. He exhibits no distension. There is no tenderness. There is no rebound and no guarding.  Musculoskeletal: Normal range of motion.  Neurological: He is alert and oriented to person, place, and time.  Skin: Skin is warm. No rash noted.  Psychiatric: He has a normal mood and affect.  Vitals reviewed.  Assessment & Plan:   Problem List Items Addressed This Visit    Uncontrolled type 2 diabetes mellitus with complication ([place])    Uncontrolled. Increasing Lantus to 18 units daily. Increasing Novolog to 6 units TIDAC.      Severe sepsis ([place]) - Primary    Resolved.  Doing well at this time.      Hyperlipidemia    At goal. Continue Simvastatin (ALT only mildly elevated).      Relevant Orders   Lipid panel (Completed)   HTN (hypertension) (Chronic)    At goal. We'll continue to monitor closely. As creatinine improves will need to add back ARB and potentially other medications.      History of gout    Stopping allopuriol due to renal insufficiency (will restart if needed as creatinine/GFR continues to improve).       Cardiomyopathy ([place])    Noted during admission. Has cardiology follow-up. No evidence of volume overload at this time. Needs stress test.      Acute renal failure ([place])     Patient tachycardic today. Slightly dry. Repeat labs done today with markedly improved creatinine. Has follow-up with renal later this week. Will continue to monitor closely.       Relevant Orders   Comp Met (CMET) (Completed)    Other Visit Diagnoses    Anemia, unspecified type       Relevant Orders   CBC (Completed)      Outpatient Encounter Prescriptions as of 06/09/2016  Medication Sig  . calcium acetate (PHOSLO) 667 MG capsule Take 2 capsules (1,334 mg total) by mouth 3 (three) times daily with meals.  . cetirizine (ZYRTEC) 10 MG tablet Take 10 mg by mouth daily.  . insulin aspart (NOVOLOG) 100 UNIT/ML injection Inject 0-20 Units into the skin every 4 (four) hours. 150 to 200 -2 units, 201 to 250- 4 units, 251 to 300-6 units, 301 to 350- 8 units, 351 to 400- 10 units, greater than 450 ise 12 units.  . insulin glargine (LANTUS) 100 UNIT/ML injection Inject 0.14 mLs (14 Units total) into the skin daily.  . Insulin Syringe-Needle U-100 25G X 5/8" 1 ML MISC Use as instructed when taking insulin up to 5 times a daily.  . multivitamin (RENA-VIT) TABS tablet Take 1 tablet by mouth at bedtime.  Marland Kitchen nystatin-triamcinolone ointment (MYCOLOG) Apply 1 application topically 2 (two) times daily. Apply to groin area for 2 weeks, twice daily.  . pantoprazole (PROTONIX) 40 MG tablet Take 1 tablet (40 mg total) by mouth daily.  . sildenafil (REVATIO) 20 MG tablet Take 60-100 mg by mouth daily as needed (ED).  . simvastatin (ZOCOR) 20 MG tablet Take 20 mg by mouth daily.  . tamsulosin (FLOMAX) 0.4 MG CAPS capsule Take 0.4 mg by mouth daily.  . [DISCONTINUED] allopurinol (ZYLOPRIM) 300 MG tablet Take 300 mg by mouth daily.  Marland Kitchen albuterol (PROVENTIL) (2.5 MG/3ML) 0.083% nebulizer solution Take 3 mLs (2.5 mg total) by nebulization every 6 (six) hours as needed for wheezing or shortness of breath.  . [DISCONTINUED] Insulin Syringe-Needle U-100 25G X 5/8" 1 ML MISC Use as instructed when taking insulin up to  5 times a daily.   No facility-administered encounter medications on file as of 06/09/2016.     Follow-up: 1 week.  [place]

## 2016-06-09 NOTE — Assessment & Plan Note (Signed)
Uncontrolled. Increasing Lantus to 18 units daily. Increasing Novolog to 6 units TIDAC.

## 2016-06-09 NOTE — Assessment & Plan Note (Signed)
Noted during admission. Has cardiology follow-up. No evidence of volume overload at this time. Needs stress test.

## 2016-06-09 NOTE — Assessment & Plan Note (Signed)
At goal. We'll continue to monitor closely. As creatinine improves will need to add back ARB and potentially other medications.

## 2016-06-09 NOTE — Assessment & Plan Note (Signed)
Resolved. Doing well at this time. ?

## 2016-06-09 NOTE — Telephone Encounter (Signed)
[facility] lab called with critical result on patient. BUN of 94.

## 2016-06-09 NOTE — Assessment & Plan Note (Signed)
Patient tachycardic today. Slightly dry. Repeat labs done today with markedly improved creatinine. Has follow-up with renal later this week. Will continue to monitor closely.

## 2016-06-09 NOTE — Progress Notes (Signed)
Pre visit review using our clinic review tool, if applicable. No additional management support is needed unless otherwise documented below in the visit note. 

## 2016-06-09 NOTE — Patient Instructions (Addendum)
Lantus 18 units daily.  Novolog 6 units with each meal daily.   Stop the allopurinol.  We will call with the lab results.  [facility] for the nebulizer.  Follow up in 1 week.   Take care  Dr. Adriana Simas

## 2016-06-09 NOTE — Assessment & Plan Note (Signed)
Stopping allopuriol due to renal insufficiency (will restart if needed as creatinine/GFR continues to improve).

## 2016-06-11 DIAGNOSIS — E119 Type 2 diabetes mellitus without complications: Secondary | ICD-10-CM | POA: Diagnosis not present

## 2016-06-11 DIAGNOSIS — I5021 Acute systolic (congestive) heart failure: Secondary | ICD-10-CM | POA: Diagnosis not present

## 2016-06-11 DIAGNOSIS — Z87440 Personal history of urinary (tract) infections: Secondary | ICD-10-CM | POA: Diagnosis not present

## 2016-06-11 DIAGNOSIS — N179 Acute kidney failure, unspecified: Secondary | ICD-10-CM | POA: Diagnosis not present

## 2016-06-11 DIAGNOSIS — I110 Hypertensive heart disease with heart failure: Secondary | ICD-10-CM | POA: Diagnosis not present

## 2016-06-11 DIAGNOSIS — M6282 Rhabdomyolysis: Secondary | ICD-10-CM | POA: Diagnosis not present

## 2016-06-12 ENCOUNTER — Telehealth (HOSPITAL_COMMUNITY): Payer: Self-pay

## 2016-06-12 DIAGNOSIS — I10 Essential (primary) hypertension: Secondary | ICD-10-CM | POA: Diagnosis not present

## 2016-06-12 DIAGNOSIS — E1129 Type 2 diabetes mellitus with other diabetic kidney complication: Secondary | ICD-10-CM | POA: Diagnosis not present

## 2016-06-12 DIAGNOSIS — N179 Acute kidney failure, unspecified: Secondary | ICD-10-CM | POA: Diagnosis not present

## 2016-06-12 DIAGNOSIS — D631 Anemia in chronic kidney disease: Secondary | ICD-10-CM | POA: Diagnosis not present

## 2016-06-12 DIAGNOSIS — Z905 Acquired absence of kidney: Secondary | ICD-10-CM | POA: Diagnosis not present

## 2016-06-12 DIAGNOSIS — I429 Cardiomyopathy, unspecified: Secondary | ICD-10-CM | POA: Diagnosis not present

## 2016-06-12 DIAGNOSIS — N184 Chronic kidney disease, stage 4 (severe): Secondary | ICD-10-CM | POA: Diagnosis not present

## 2016-06-12 DIAGNOSIS — N2581 Secondary hyperparathyroidism of renal origin: Secondary | ICD-10-CM | POA: Diagnosis not present

## 2016-06-12 DIAGNOSIS — Z6830 Body mass index (BMI) 30.0-30.9, adult: Secondary | ICD-10-CM | POA: Diagnosis not present

## 2016-06-12 NOTE — Telephone Encounter (Signed)
Encounter complete. 

## 2016-06-13 DIAGNOSIS — I110 Hypertensive heart disease with heart failure: Secondary | ICD-10-CM | POA: Diagnosis not present

## 2016-06-13 DIAGNOSIS — N179 Acute kidney failure, unspecified: Secondary | ICD-10-CM | POA: Diagnosis not present

## 2016-06-13 DIAGNOSIS — E119 Type 2 diabetes mellitus without complications: Secondary | ICD-10-CM | POA: Diagnosis not present

## 2016-06-13 DIAGNOSIS — Z87440 Personal history of urinary (tract) infections: Secondary | ICD-10-CM | POA: Diagnosis not present

## 2016-06-13 DIAGNOSIS — M6282 Rhabdomyolysis: Secondary | ICD-10-CM | POA: Diagnosis not present

## 2016-06-13 DIAGNOSIS — I5021 Acute systolic (congestive) heart failure: Secondary | ICD-10-CM | POA: Diagnosis not present

## 2016-06-16 ENCOUNTER — Ambulatory Visit (INDEPENDENT_AMBULATORY_CARE_PROVIDER_SITE_OTHER): Payer: Medicare Other | Admitting: Family Medicine

## 2016-06-16 ENCOUNTER — Encounter: Payer: Self-pay | Admitting: Family Medicine

## 2016-06-16 VITALS — BP 118/72 | HR 79 | Temp 97.6°F | Resp 14 | Wt 244.2 lb

## 2016-06-16 DIAGNOSIS — I200 Unstable angina: Secondary | ICD-10-CM | POA: Diagnosis not present

## 2016-06-16 DIAGNOSIS — I10 Essential (primary) hypertension: Secondary | ICD-10-CM

## 2016-06-16 DIAGNOSIS — Z87440 Personal history of urinary (tract) infections: Secondary | ICD-10-CM | POA: Diagnosis not present

## 2016-06-16 DIAGNOSIS — N179 Acute kidney failure, unspecified: Secondary | ICD-10-CM | POA: Diagnosis not present

## 2016-06-16 DIAGNOSIS — E118 Type 2 diabetes mellitus with unspecified complications: Secondary | ICD-10-CM | POA: Diagnosis not present

## 2016-06-16 DIAGNOSIS — Z794 Long term (current) use of insulin: Secondary | ICD-10-CM

## 2016-06-16 DIAGNOSIS — E119 Type 2 diabetes mellitus without complications: Secondary | ICD-10-CM | POA: Diagnosis not present

## 2016-06-16 DIAGNOSIS — E785 Hyperlipidemia, unspecified: Secondary | ICD-10-CM

## 2016-06-16 DIAGNOSIS — E1165 Type 2 diabetes mellitus with hyperglycemia: Secondary | ICD-10-CM

## 2016-06-16 DIAGNOSIS — M6282 Rhabdomyolysis: Secondary | ICD-10-CM | POA: Diagnosis not present

## 2016-06-16 DIAGNOSIS — IMO0002 Reserved for concepts with insufficient information to code with codable children: Secondary | ICD-10-CM

## 2016-06-16 DIAGNOSIS — D649 Anemia, unspecified: Secondary | ICD-10-CM | POA: Diagnosis not present

## 2016-06-16 DIAGNOSIS — N400 Enlarged prostate without lower urinary tract symptoms: Secondary | ICD-10-CM | POA: Insufficient documentation

## 2016-06-16 DIAGNOSIS — H8110 Benign paroxysmal vertigo, unspecified ear: Secondary | ICD-10-CM | POA: Insufficient documentation

## 2016-06-16 DIAGNOSIS — I5021 Acute systolic (congestive) heart failure: Secondary | ICD-10-CM | POA: Diagnosis not present

## 2016-06-16 DIAGNOSIS — I110 Hypertensive heart disease with heart failure: Secondary | ICD-10-CM | POA: Diagnosis not present

## 2016-06-16 LAB — CBC
HCT: 29.1 % — ABNORMAL LOW (ref 39.0–52.0)
HEMOGLOBIN: 9.6 g/dL — AB (ref 13.0–17.0)
MCHC: 33.2 g/dL (ref 30.0–36.0)
MCV: 92 fl (ref 78.0–100.0)
PLATELETS: 219 10*3/uL (ref 150.0–400.0)
RBC: 3.17 Mil/uL — AB (ref 4.22–5.81)
RDW: 15.8 % — ABNORMAL HIGH (ref 11.5–15.5)
WBC: 4.4 10*3/uL (ref 4.0–10.5)

## 2016-06-16 LAB — COMPREHENSIVE METABOLIC PANEL
ALK PHOS: 64 U/L (ref 39–117)
ALT: 29 U/L (ref 0–53)
AST: 30 U/L (ref 0–37)
Albumin: 3.6 g/dL (ref 3.5–5.2)
BILIRUBIN TOTAL: 0.3 mg/dL (ref 0.2–1.2)
BUN: 40 mg/dL — ABNORMAL HIGH (ref 6–23)
CALCIUM: 9.5 mg/dL (ref 8.4–10.5)
CO2: 23 meq/L (ref 19–32)
CREATININE: 2.33 mg/dL — AB (ref 0.40–1.50)
Chloride: 102 mEq/L (ref 96–112)
GFR: 35.28 mL/min — AB (ref >60.00)
GLUCOSE: 178 mg/dL — AB (ref 70–99)
Potassium: 5.1 mEq/L (ref 3.5–5.1)
Sodium: 135 mEq/L (ref 135–145)
TOTAL PROTEIN: 7 g/dL (ref 6.0–8.3)

## 2016-06-16 MED ORDER — TAMSULOSIN HCL 0.4 MG PO CAPS: 0.4000 mg | ORAL_CAPSULE | Freq: Every day | ORAL | 3 refills | Status: AC

## 2016-06-16 MED ORDER — GLUCOSE BLOOD VI STRP: ORAL_STRIP | 12 refills | Status: DC

## 2016-06-16 MED ORDER — SIMVASTATIN 20 MG PO TABS: 20.0000 mg | ORAL_TABLET | Freq: Every day | ORAL | 3 refills | Status: AC

## 2016-06-16 NOTE — Patient Instructions (Signed)
Continue your meds.  We will arrange your vestibular rehab.  Follow up in 1 month.  Take care  Dr. Adriana Simas

## 2016-06-16 NOTE — Progress Notes (Signed)
Subjective:  Patient ID: Jonathan Waller, male    DOB: 09/27/1940  Age: 75 y.o. MRN: [id number]  CC: Follow up   HPI:  75 year old male with hypertension, hyperlipidemia, uncontrolled DM 2, anemia, recent acute renal failure presents for follow-up.  HTN  Well controlled.  On no meds at this time.  HLD  At goal on Simvastatin.  Needs refill.  DM-2  Sugars improving. He states that the majority of his readings are in the 140s.  He has had a significant improvement with titration of his insulin regimen from last visit.  No reports of hypoglycemia.  Acute renal failure  Creatinine improving.  Needs additional labs today.  Is now being followed by Renal.  Vertigo  Patient states that he's recently been experiencing vertigo.  Patient states that his dizziness is characterized by spinning.  Occurs with abrupt movements of the head.  Brief and resolves following lack of movement.  This is been noted at home when doing physical therapy.  Social Hx   Social History   Social History  . Marital status: Legally Separated    Spouse name: N/A  . Number of children: N/A  . Years of education: N/A   Social History Main Topics  . Smoking status: Never Smoker  . Smokeless tobacco: Never Used  . Alcohol use No  . Drug use: Unknown  . Sexual activity: Yes    Partners: Female   Other Topics Concern  . None   Social History Narrative  . None    Review of Systems  Constitutional: Negative.   Respiratory: Negative.   Cardiovascular: Negative.    Objective:  BP 118/72 (BP Location: Left Arm, Patient Position: Sitting, Cuff Size: Large)   Pulse 79   Temp 97.6 F (36.4 C) (Oral)   Resp 14   Wt 244 lb 4 oz (110.8 kg)   SpO2 100%   BMI 33.13 kg/m   BP/Weight 06/16/2016 06/09/2016 06/06/2016  Systolic BP 118 101 117  Diastolic BP 72 62 63  Wt. (Lbs) 244.25 238.5 -  BMI 33.13 32.35 -   Physical Exam  Constitutional: He is oriented to person, place,  and time. He appears well-developed. No distress.  Cardiovascular: Normal rate and regular rhythm.   Pulmonary/Chest: Effort normal and breath sounds normal.  Abdominal: Soft. He exhibits no distension. There is no tenderness. There is no rebound and no guarding.  Neurological: He is alert and oriented to person, place, and time.  Psychiatric: He has a normal mood and affect.  Vitals reviewed.  Lab Results  Component Value Date   WBC 5.7 06/09/2016   HGB 11.3 (L) 06/09/2016   HCT 34.0 (L) 06/09/2016   PLT 400.0 06/09/2016   GLUCOSE 267 (H) 06/09/2016   CHOL 114 06/09/2016   TRIG 171.0 (H) 06/09/2016   HDL 38.70 (L) 06/09/2016   LDLCALC 42 06/09/2016   ALT 60 (H) 06/09/2016   AST 28 06/09/2016   NA 135 06/09/2016   K 4.9 06/09/2016   CL 99 06/09/2016   CREATININE 3.95 (H) 06/09/2016   BUN 94 (HH) 06/09/2016   CO2 24 06/09/2016   HGBA1C 10.0 (H) 05/27/2016    Assessment & Plan:   Problem List Items Addressed This Visit    Uncontrolled type 2 diabetes mellitus with complication (HCC)    Improving. Sugars are at goal currently. Continue current insulin regimen (Basal 18, Bolus 6/6/6).      Relevant Medications   simvastatin (ZOCOR) 20 MG tablet   Hyperlipidemia  At goal on simvastatin. Refilled today.      Relevant Medications   simvastatin (ZOCOR) 20 MG tablet   HTN (hypertension) - Primary (Chronic)    At goal. As renal function improves would add back low dose ACEI given DM.      Relevant Medications   simvastatin (ZOCOR) 20 MG tablet   Other Relevant Orders   Comprehensive metabolic panel   BPPV (benign paroxysmal positional vertigo)    New problem. Arranging vestibular rehab.      Anemia   Relevant Orders   CBC   Acute renal failure (HCC)    Improving. Rechecking creatinine today.         Meds ordered this encounter  Medications  . tamsulosin (FLOMAX) 0.4 MG CAPS capsule    Sig: Take 1 capsule (0.4 mg total) by mouth daily.    Dispense:   90 capsule    Refill:  3  . simvastatin (ZOCOR) 20 MG tablet    Sig: Take 1 tablet (20 mg total) by mouth daily.    Dispense:  90 tablet    Refill:  3  . glucose blood test strip    Sig: Use as instructed to check your blood sugar daily.E11.8, E11.65, Z79.4    Dispense:  100 each    Refill:  12    Freestyle meter.    Follow-up: Return in about 1 month (around 07/17/2016).  Everlene OtherJayce Darcell Yacoub DO [facility] [place]

## 2016-06-16 NOTE — Assessment & Plan Note (Signed)
New problem. Arranging vestibular rehab.

## 2016-06-16 NOTE — Assessment & Plan Note (Signed)
Improving. Rechecking creatinine today.

## 2016-06-16 NOTE — Progress Notes (Signed)
Pre visit review using our clinic review tool, if applicable. No additional management support is needed unless otherwise documented below in the visit note. 

## 2016-06-16 NOTE — Assessment & Plan Note (Signed)
At goal. As renal function improves would add back low dose ACEI given DM.

## 2016-06-16 NOTE — Assessment & Plan Note (Signed)
At goal on simvastatin. Refilled today.

## 2016-06-16 NOTE — Assessment & Plan Note (Signed)
Improving. Sugars are at goal currently. Continue current insulin regimen (Basal 18, Bolus 6/6/6).

## 2016-06-17 DIAGNOSIS — Z87440 Personal history of urinary (tract) infections: Secondary | ICD-10-CM | POA: Diagnosis not present

## 2016-06-17 DIAGNOSIS — N179 Acute kidney failure, unspecified: Secondary | ICD-10-CM | POA: Diagnosis not present

## 2016-06-17 DIAGNOSIS — M6282 Rhabdomyolysis: Secondary | ICD-10-CM | POA: Diagnosis not present

## 2016-06-17 DIAGNOSIS — I110 Hypertensive heart disease with heart failure: Secondary | ICD-10-CM | POA: Diagnosis not present

## 2016-06-17 DIAGNOSIS — E119 Type 2 diabetes mellitus without complications: Secondary | ICD-10-CM | POA: Diagnosis not present

## 2016-06-17 DIAGNOSIS — I5021 Acute systolic (congestive) heart failure: Secondary | ICD-10-CM | POA: Diagnosis not present

## 2016-06-18 ENCOUNTER — Ambulatory Visit (HOSPITAL_COMMUNITY)
Admit: 2016-06-18 | Discharge: 2016-06-18 | Disposition: A | Discharge status: Home / Self Care | Payer: Medicare Other | Attending: Cardiovascular Disease | Admitting: Cardiovascular Disease

## 2016-06-18 DIAGNOSIS — Z794 Long term (current) use of insulin: Secondary | ICD-10-CM | POA: Insufficient documentation

## 2016-06-18 DIAGNOSIS — E785 Hyperlipidemia, unspecified: Secondary | ICD-10-CM | POA: Diagnosis not present

## 2016-06-18 DIAGNOSIS — I429 Cardiomyopathy, unspecified: Secondary | ICD-10-CM | POA: Diagnosis not present

## 2016-06-18 DIAGNOSIS — I10 Essential (primary) hypertension: Secondary | ICD-10-CM | POA: Diagnosis not present

## 2016-06-18 DIAGNOSIS — Z905 Acquired absence of kidney: Secondary | ICD-10-CM | POA: Diagnosis not present

## 2016-06-18 DIAGNOSIS — Z87891 Personal history of nicotine dependence: Secondary | ICD-10-CM | POA: Diagnosis not present

## 2016-06-18 DIAGNOSIS — I200 Unstable angina: Secondary | ICD-10-CM

## 2016-06-18 DIAGNOSIS — E119 Type 2 diabetes mellitus without complications: Secondary | ICD-10-CM | POA: Diagnosis not present

## 2016-06-18 LAB — MYOCARDIAL PERFUSION IMAGING
CHL CUP NUCLEAR SDS: 3
CHL CUP NUCLEAR SSS: 11
CHL CUP RESTING HR STRESS: 81 {beats}/min
LVDIAVOL: 126 mL (ref 62–150)
LVSYSVOL: 64 mL
NUC STRESS TID: 1.1
Peak HR: 99 {beats}/min
SRS: 8

## 2016-06-18 MED ORDER — TECHNETIUM TC 99M TETROFOSMIN IV KIT
10.0000 | PACK | Freq: Once | INTRAVENOUS | Status: AC | PRN
  Administered 2016-06-18: 10 via INTRAVENOUS
  Filled 2016-06-18: qty 10

## 2016-06-18 MED ORDER — TECHNETIUM TC 99M TETROFOSMIN IV KIT
31.6000 | PACK | Freq: Once | INTRAVENOUS | Status: AC | PRN
  Administered 2016-06-18: 31.6 via INTRAVENOUS
  Filled 2016-06-18: qty 32

## 2016-06-18 MED ORDER — REGADENOSON 0.4 MG/5ML IV SOLN
0.4000 mg | Freq: Once | INTRAVENOUS | Status: AC
  Administered 2016-06-18: 0.4 mg via INTRAVENOUS

## 2016-06-19 DIAGNOSIS — N179 Acute kidney failure, unspecified: Secondary | ICD-10-CM | POA: Diagnosis not present

## 2016-06-19 DIAGNOSIS — I110 Hypertensive heart disease with heart failure: Secondary | ICD-10-CM | POA: Diagnosis not present

## 2016-06-19 DIAGNOSIS — I5021 Acute systolic (congestive) heart failure: Secondary | ICD-10-CM | POA: Diagnosis not present

## 2016-06-19 DIAGNOSIS — E119 Type 2 diabetes mellitus without complications: Secondary | ICD-10-CM | POA: Diagnosis not present

## 2016-06-19 DIAGNOSIS — M6282 Rhabdomyolysis: Secondary | ICD-10-CM | POA: Diagnosis not present

## 2016-06-19 DIAGNOSIS — Z87440 Personal history of urinary (tract) infections: Secondary | ICD-10-CM | POA: Diagnosis not present

## 2016-06-20 DIAGNOSIS — E119 Type 2 diabetes mellitus without complications: Secondary | ICD-10-CM | POA: Diagnosis not present

## 2016-06-20 DIAGNOSIS — I110 Hypertensive heart disease with heart failure: Secondary | ICD-10-CM | POA: Diagnosis not present

## 2016-06-20 DIAGNOSIS — M6282 Rhabdomyolysis: Secondary | ICD-10-CM | POA: Diagnosis not present

## 2016-06-20 DIAGNOSIS — N179 Acute kidney failure, unspecified: Secondary | ICD-10-CM | POA: Diagnosis not present

## 2016-06-20 DIAGNOSIS — I5021 Acute systolic (congestive) heart failure: Secondary | ICD-10-CM | POA: Diagnosis not present

## 2016-06-20 DIAGNOSIS — Z87440 Personal history of urinary (tract) infections: Secondary | ICD-10-CM | POA: Diagnosis not present

## 2016-06-24 DIAGNOSIS — E119 Type 2 diabetes mellitus without complications: Secondary | ICD-10-CM | POA: Diagnosis not present

## 2016-06-24 DIAGNOSIS — I110 Hypertensive heart disease with heart failure: Secondary | ICD-10-CM | POA: Diagnosis not present

## 2016-06-24 DIAGNOSIS — N179 Acute kidney failure, unspecified: Secondary | ICD-10-CM | POA: Diagnosis not present

## 2016-06-24 DIAGNOSIS — I5021 Acute systolic (congestive) heart failure: Secondary | ICD-10-CM | POA: Diagnosis not present

## 2016-06-24 DIAGNOSIS — M6282 Rhabdomyolysis: Secondary | ICD-10-CM | POA: Diagnosis not present

## 2016-06-24 DIAGNOSIS — Z87440 Personal history of urinary (tract) infections: Secondary | ICD-10-CM | POA: Diagnosis not present

## 2016-06-25 ENCOUNTER — Telehealth: Payer: Self-pay | Admitting: Cardiology

## 2016-06-25 NOTE — Telephone Encounter (Signed)
Received records from [facility] for appointment on 07/01/16 with Dr Swaziland.  Records given to [facility] (medical records) for Dr Elvis Coil schedule on 07/01/16. lp

## 2016-06-26 DIAGNOSIS — Z87440 Personal history of urinary (tract) infections: Secondary | ICD-10-CM | POA: Diagnosis not present

## 2016-06-26 DIAGNOSIS — I110 Hypertensive heart disease with heart failure: Secondary | ICD-10-CM | POA: Diagnosis not present

## 2016-06-26 DIAGNOSIS — E119 Type 2 diabetes mellitus without complications: Secondary | ICD-10-CM | POA: Diagnosis not present

## 2016-06-26 DIAGNOSIS — I5021 Acute systolic (congestive) heart failure: Secondary | ICD-10-CM | POA: Diagnosis not present

## 2016-06-26 DIAGNOSIS — N179 Acute kidney failure, unspecified: Secondary | ICD-10-CM | POA: Diagnosis not present

## 2016-06-26 DIAGNOSIS — M6282 Rhabdomyolysis: Secondary | ICD-10-CM | POA: Diagnosis not present

## 2016-06-30 NOTE — Progress Notes (Signed)
Cardiology Office Note    Date:  07/01/2016   ID:  Jonathan Waller, DOB [birth year], MRN [id number]  PCP:  Tommie Sams, DO  Cardiologist:  Levetta Bognar [place], MD    History of Present Illness:  Jonathan Waller is a 76 y.o. male seen for post hospital follow  Up. He has a history of HTN, HL, and uncontrolled DM. He also has a history of CKD. He was admitted 11/28-12/8/17 with acute gram negative sepsis with E coli, rhabdomyolysis, ARF requiring dialysis, and delirium. He did have significant ST changes noted on Ecg. Echo showed EF of 40% with apical akinesis. Troponin went up to .48. Repeat Echo prior to DC showed improvement in EF to 50-55% with no wall motion abnormality. Subsequent outpatient Myoview showed normal perfusion with EF 48%.  On follow up today he is feeling well. He is still getting OT and PT and getting stronger. No dyspnea or chest pain. Renal function has improved with creatinine 2.3. No edema.     Past Medical History:  Diagnosis Date  . BPH (benign prostatic hyperplasia)   . CKD (chronic kidney disease)   . GERD (gastroesophageal reflux disease)   . Gout   . HLD (hyperlipidemia)   . HTN (hypertension)   . IDDM (insulin dependent diabetes mellitus) (HCC)     Past Surgical History:  Procedure Laterality Date  . BACK SURGERY    . NEPHRECTOMY      Current Medications: Outpatient Medications Prior to Visit  Medication Sig Dispense Refill  . albuterol (PROVENTIL) (2.5 MG/3ML) 0.083% nebulizer solution Take 3 mLs (2.5 mg total) by nebulization every 6 (six) hours as needed for wheezing or shortness of breath. 150 mL 1  . cetirizine (ZYRTEC) 10 MG tablet Take 10 mg by mouth daily.    Marland Kitchen glucose blood test strip Use as instructed to check your blood sugar daily.E11.8, E11.65, Z79.4 100 each 12  . insulin aspart (NOVOLOG) 100 UNIT/ML injection Inject 0-20 Units into the skin every 4 (four) hours. 150 to 200 -2 units, 201 to 250- 4 units, 251 to 300-6 units, 301 to 350- 8  units, 351 to 400- 10 units, greater than 450 ise 12 units. 10 mL 11  . insulin glargine (LANTUS) 100 UNIT/ML injection Inject 0.14 mLs (14 Units total) into the skin daily. 10 mL 11  . Insulin Syringe-Needle U-100 25G X 5/8" 1 ML MISC Use as instructed when taking insulin up to 5 times a daily. 100 each 0  . multivitamin (RENA-VIT) TABS tablet Take 1 tablet by mouth at bedtime. 30 tablet 0  . nystatin-triamcinolone ointment (MYCOLOG) Apply 1 application topically 2 (two) times daily. Apply to groin area for 2 weeks, twice daily. 30 g 0  . pantoprazole (PROTONIX) 40 MG tablet Take 1 tablet (40 mg total) by mouth daily. 30 tablet 0  . sildenafil (REVATIO) 20 MG tablet Take 60-100 mg by mouth daily as needed (ED).    . simvastatin (ZOCOR) 20 MG tablet Take 1 tablet (20 mg total) by mouth daily. 90 tablet 3  . tamsulosin (FLOMAX) 0.4 MG CAPS capsule Take 1 capsule (0.4 mg total) by mouth daily. 90 capsule 3  . calcium acetate (PHOSLO) 667 MG capsule Take 2 capsules (1,334 mg total) by mouth 3 (three) times daily with meals. (Patient not taking: Reported on 07/01/2016) 30 capsule 0   No facility-administered medications prior to visit.      Allergies:   Aspirin   Social History   Social History  .  Marital status: Legally Separated    Spouse name: N/A  . Number of children: N/A  . Years of education: N/A   Social History Main Topics  . Smoking status: Never Smoker  . Smokeless tobacco: Never Used  . Alcohol use No  . Drug use: Unknown  . Sexual activity: Yes    Partners: Female   Other Topics Concern  . None   Social History Narrative  . None     Family History:  The patient's family history includes Arthritis in his mother; Diabetes in his mother; Hypertension in his mother.   ROS:   Please see the history of present illness.    ROS All other systems reviewed and are negative.   PHYSICAL EXAM:   VS:  BP 136/84   Pulse 89   Ht 6\' 2"  (1.88 m)   Wt 247 lb (112 kg)   BMI  31.71 kg/m    GEN: Well nourished, obese, in no acute distress  HEENT: normal  Neck: no JVD, carotid bruits, or masses Cardiac: RRR; no murmurs, rubs, or gallops,no edema  Respiratory:  clear to auscultation bilaterally, normal work of breathing GI: soft, nontender, nondistended, + BS MS: no deformity or atrophy  Skin: warm and dry, no rash Neuro:  Alert and Oriented x 3, Strength and sensation are intact Psych: euthymic mood, full affect  Wt Readings from Last 3 Encounters:  07/01/16 247 lb (112 kg)  06/18/16 238 lb (108 kg)  06/16/16 244 lb 4 oz (110.8 kg)      Studies/Labs Reviewed:   EKG:  EKG is not ordered today.  Recent Labs: 05/26/2016: B Natriuretic Peptide 190.1 06/02/2016: Magnesium 2.2 06/16/2016: ALT 29; BUN 40; Creatinine, Ser 2.33; Hemoglobin 9.6; Platelets 219.0; Potassium 5.1; Sodium 135   Lipid Panel    Component Value Date/Time   CHOL 114 06/09/2016 1012   TRIG 171.0 (H) 06/09/2016 1012   HDL 38.70 (L) 06/09/2016 1012   CHOLHDL 3 06/09/2016 1012   VLDL 34.2 06/09/2016 1012   LDLCALC 42 06/09/2016 1012    Additional studies/ records that were reviewed today include:  Echo 06/02/16: Study Conclusions  - Left ventricle: The cavity size was normal. Wall thickness was   normal. Systolic function was normal. The estimated ejection   fraction was in the range of 50% to 55%. Wall motion was normal;   there were no regional wall motion abnormalities. Doppler   parameters are consistent with abnormal left ventricular   relaxation (grade 1 diastolic dysfunction  Myoview:06/18/16: Study Highlights     Nuclear stress EF: 49%.  The left ventricular ejection fraction is mildly decreased (45-54%).  There was no ST segment deviation noted during stress.  The study is normal.  This is a low risk study.   This study is of limited quality, however there is no evidence of ischemia. Calculated LVEF is 49% but appears slightly better.      ASSESSMENT:     1. Essential hypertension   2. Other cardiomyopathy (HCC)   3. Hyperlipidemia, unspecified hyperlipidemia type      PLAN:  In order of problems listed above:  1. Acute cardiomyopathy noted during hospitalization with acute sepsis, rhabdomyolysis, ARF. I think this is all related to his acute sepsis syndrome. EF has normalized. No evidence of ischemia by Myoview. This is good news for him. No further cardiac evaluation or follow up needed. I will see prn.     Medication Adjustments/Labs and Tests Ordered: Current medicines are reviewed at  length with the patient today.  Concerns regarding medicines are outlined above.  Medication changes, Labs and Tests ordered today are listed in the Patient Instructions below. Patient Instructions  Continue your current therapy  I will see you as needed.     Signed, Emila Steinhauser [place], MD  07/01/2016 5:38 PM    [facility] [address], [place], [place], [address] [phone]

## 2016-07-01 ENCOUNTER — Ambulatory Visit (INDEPENDENT_AMBULATORY_CARE_PROVIDER_SITE_OTHER): Payer: Medicare Other | Admitting: Cardiology

## 2016-07-01 ENCOUNTER — Encounter: Payer: Self-pay | Admitting: Cardiology

## 2016-07-01 VITALS — BP 136/84 | HR 89 | Ht 74.0 in | Wt 247.0 lb

## 2016-07-01 DIAGNOSIS — M6282 Rhabdomyolysis: Secondary | ICD-10-CM | POA: Diagnosis not present

## 2016-07-01 DIAGNOSIS — I428 Other cardiomyopathies: Secondary | ICD-10-CM

## 2016-07-01 DIAGNOSIS — Z87440 Personal history of urinary (tract) infections: Secondary | ICD-10-CM | POA: Diagnosis not present

## 2016-07-01 DIAGNOSIS — I10 Essential (primary) hypertension: Secondary | ICD-10-CM

## 2016-07-01 DIAGNOSIS — E119 Type 2 diabetes mellitus without complications: Secondary | ICD-10-CM | POA: Diagnosis not present

## 2016-07-01 DIAGNOSIS — N179 Acute kidney failure, unspecified: Secondary | ICD-10-CM | POA: Diagnosis not present

## 2016-07-01 DIAGNOSIS — I5021 Acute systolic (congestive) heart failure: Secondary | ICD-10-CM | POA: Diagnosis not present

## 2016-07-01 DIAGNOSIS — I110 Hypertensive heart disease with heart failure: Secondary | ICD-10-CM | POA: Diagnosis not present

## 2016-07-01 DIAGNOSIS — E785 Hyperlipidemia, unspecified: Secondary | ICD-10-CM | POA: Diagnosis not present

## 2016-07-01 NOTE — Patient Instructions (Signed)
Continue your current therapy  I will see you as needed. 

## 2016-07-02 DIAGNOSIS — N179 Acute kidney failure, unspecified: Secondary | ICD-10-CM | POA: Diagnosis not present

## 2016-07-02 DIAGNOSIS — I110 Hypertensive heart disease with heart failure: Secondary | ICD-10-CM | POA: Diagnosis not present

## 2016-07-02 DIAGNOSIS — I5021 Acute systolic (congestive) heart failure: Secondary | ICD-10-CM | POA: Diagnosis not present

## 2016-07-02 DIAGNOSIS — M6282 Rhabdomyolysis: Secondary | ICD-10-CM | POA: Diagnosis not present

## 2016-07-02 DIAGNOSIS — E119 Type 2 diabetes mellitus without complications: Secondary | ICD-10-CM | POA: Diagnosis not present

## 2016-07-02 DIAGNOSIS — Z87440 Personal history of urinary (tract) infections: Secondary | ICD-10-CM | POA: Diagnosis not present

## 2016-07-03 DIAGNOSIS — N179 Acute kidney failure, unspecified: Secondary | ICD-10-CM | POA: Diagnosis not present

## 2016-07-03 DIAGNOSIS — I5021 Acute systolic (congestive) heart failure: Secondary | ICD-10-CM | POA: Diagnosis not present

## 2016-07-03 DIAGNOSIS — E119 Type 2 diabetes mellitus without complications: Secondary | ICD-10-CM | POA: Diagnosis not present

## 2016-07-03 DIAGNOSIS — Z87440 Personal history of urinary (tract) infections: Secondary | ICD-10-CM | POA: Diagnosis not present

## 2016-07-03 DIAGNOSIS — M6282 Rhabdomyolysis: Secondary | ICD-10-CM | POA: Diagnosis not present

## 2016-07-03 DIAGNOSIS — I110 Hypertensive heart disease with heart failure: Secondary | ICD-10-CM | POA: Diagnosis not present

## 2016-07-08 ENCOUNTER — Other Ambulatory Visit: Payer: Self-pay | Admitting: Family Medicine

## 2016-07-08 ENCOUNTER — Telehealth: Payer: Self-pay | Admitting: *Deleted

## 2016-07-08 MED ORDER — INSULIN ASPART 100 UNIT/ML ~~LOC~~ SOLN: 0.0000 [IU] | SUBCUTANEOUS | 2 refills | Status: DC

## 2016-07-08 MED ORDER — INSULIN GLARGINE 100 UNIT/ML ~~LOC~~ SOLN: 14.0000 [IU] | SUBCUTANEOUS | 2 refills | Status: DC

## 2016-07-08 NOTE — Telephone Encounter (Signed)
Patient will need a medication refill for lantus and [facility]olog [facility]

## 2016-07-08 NOTE — Telephone Encounter (Signed)
Mrs.Roper called per DPR it was okay to speak with her. Pt was given a print out of insulin at ED. [facility] due to [facility]. Pt wanted [place] to send a new [facility] script.

## 2016-07-17 ENCOUNTER — Ambulatory Visit: Payer: Medicare Other | Admitting: Family Medicine

## 2016-07-21 ENCOUNTER — Ambulatory Visit (INDEPENDENT_AMBULATORY_CARE_PROVIDER_SITE_OTHER): Payer: Medicare Other

## 2016-07-21 ENCOUNTER — Encounter: Payer: Self-pay | Admitting: Family Medicine

## 2016-07-21 ENCOUNTER — Ambulatory Visit (INDEPENDENT_AMBULATORY_CARE_PROVIDER_SITE_OTHER): Payer: Medicare Other | Admitting: Family Medicine

## 2016-07-21 VITALS — BP 127/77 | HR 76 | Temp 97.9°F | Resp 14 | Ht 74.0 in | Wt 242.5 lb

## 2016-07-21 DIAGNOSIS — E118 Type 2 diabetes mellitus with unspecified complications: Secondary | ICD-10-CM

## 2016-07-21 DIAGNOSIS — Z794 Long term (current) use of insulin: Secondary | ICD-10-CM

## 2016-07-21 DIAGNOSIS — N183 Chronic kidney disease, stage 3 unspecified: Secondary | ICD-10-CM

## 2016-07-21 DIAGNOSIS — Z0000 Encounter for general adult medical examination without abnormal findings: Secondary | ICD-10-CM | POA: Diagnosis not present

## 2016-07-21 DIAGNOSIS — Z23 Encounter for immunization: Secondary | ICD-10-CM | POA: Diagnosis not present

## 2016-07-21 DIAGNOSIS — I10 Essential (primary) hypertension: Secondary | ICD-10-CM | POA: Diagnosis not present

## 2016-07-21 MED ORDER — PANTOPRAZOLE SODIUM 40 MG PO TBEC: 40.0000 mg | DELAYED_RELEASE_TABLET | Freq: Every day | ORAL | 3 refills | Status: DC

## 2016-07-21 MED ORDER — INSULIN GLARGINE 100 UNIT/ML ~~LOC~~ SOLN: 14.0000 [IU] | SUBCUTANEOUS | 2 refills | Status: DC

## 2016-07-21 MED ORDER — PANTOPRAZOLE SODIUM 40 MG PO TBEC: 40.0000 mg | DELAYED_RELEASE_TABLET | Freq: Every day | ORAL | 0 refills | Status: DC

## 2016-07-21 NOTE — Progress Notes (Signed)
Pre visit review using our clinic review tool, if applicable. No additional management support is needed unless otherwise documented below in the visit note. 

## 2016-07-21 NOTE — Patient Instructions (Addendum)
Jonathan Waller , Thank you for taking time to come for your Medicare Wellness Visit. I appreciate your ongoing commitment to your health goals. Please review the following plan we discussed and let me know if I can assist you in the future.   Follow up with Dr. Adriana Simas as needed.  Check with school records regarding TDAP vaccine.  Check with insurance regarding Shingles vaccine.  These are the goals we discussed: Goals    . Increase physical activity          Continue physical therapy exercises  Walk daily for exercise       This is a list of the screening recommended for you and due dates:  Health Maintenance  Topic Date Due  . Tetanus Vaccine  04/12/1960  . Shingles Vaccine  04/12/2001  . Flu Shot  06/09/2017*  . Hemoglobin A1C  11/24/2016  . Eye exam for diabetics  11/26/2016  . Complete foot exam   07/21/2017  . Urine Protein Check  07/21/2017  . Colon Cancer Screening  06/30/2022  . Pneumonia vaccines  Addressed  *Topic was postponed. The date shown is not the original due date.    Fall Prevention in the Home Introduction Falls can cause injuries. They can happen to people of all ages. There are many things you can do to make your home safe and to help prevent falls. What can I do on the outside of my home?  Regularly fix the edges of walkways and driveways and fix any cracks.  Remove anything that might make you trip as you walk through a door, such as a raised step or threshold.  Trim any bushes or trees on the path to your home.  Use bright outdoor lighting.  Clear any walking paths of anything that might make someone trip, such as rocks or tools.  Regularly check to see if handrails are loose or broken. Make sure that both sides of any steps have handrails.  Any raised decks and porches should have guardrails on the edges.  Have any leaves, snow, or ice cleared regularly.  Use sand or salt on walking paths during winter.  Clean up any spills in your  garage right away. This includes oil or grease spills. What can I do in the bathroom?  Use night lights.  Install grab bars by the toilet and in the tub and shower. Do not use towel bars as grab bars.  Use non-skid mats or decals in the tub or shower.  If you need to sit down in the shower, use a plastic, non-slip stool.  Keep the floor dry. Clean up any water that spills on the floor as soon as it happens.  Remove soap buildup in the tub or shower regularly.  Attach bath mats securely with double-sided non-slip rug tape.  Do not have throw rugs and other things on the floor that can make you trip. What can I do in the bedroom?  Use night lights.  Make sure that you have a light by your bed that is easy to reach.  Do not use any sheets or blankets that are too big for your bed. They should not hang down onto the floor.  Have a firm chair that has side arms. You can use this for support while you get dressed.  Do not have throw rugs and other things on the floor that can make you trip. What can I do in the kitchen?  Clean up any spills right away.  Avoid  walking on wet floors.  Keep items that you use a lot in easy-to-reach places.  If you need to reach something above you, use a strong step stool that has a grab bar.  Keep electrical cords out of the way.  Do not use floor polish or wax that makes floors slippery. If you must use wax, use non-skid floor wax.  Do not have throw rugs and other things on the floor that can make you trip. What can I do with my stairs?  Do not leave any items on the stairs.  Make sure that there are handrails on both sides of the stairs and use them. Fix handrails that are broken or loose. Make sure that handrails are as long as the stairways.  Check any carpeting to make sure that it is firmly attached to the stairs. Fix any carpet that is loose or worn.  Avoid having throw rugs at the top or bottom of the stairs. If you do have throw  rugs, attach them to the floor with carpet tape.  Make sure that you have a light switch at the top of the stairs and the bottom of the stairs. If you do not have them, ask someone to add them for you. What else can I do to help prevent falls?  Wear shoes that:  Do not have high heels.  Have rubber bottoms.  Are comfortable and fit you well.  Are closed at the toe. Do not wear sandals.  If you use a stepladder:  Make sure that it is fully opened. Do not climb a closed stepladder.  Make sure that both sides of the stepladder are locked into place.  Ask someone to hold it for you, if possible.  Clearly mark and make sure that you can see:  Any grab bars or handrails.  First and last steps.  Where the edge of each step is.  Use tools that help you move around (mobility aids) if they are needed. These include:  Canes.  Walkers.  Scooters.  Crutches.  Turn on the lights when you go into a dark area. Replace any light bulbs as soon as they burn out.  Set up your furniture so you have a clear path. Avoid moving your furniture around.  If any of your floors are uneven, fix them.  If there are any pets around you, be aware of where they are.  Review your medicines with your doctor. Some medicines can make you feel dizzy. This can increase your chance of falling. Ask your doctor what other things that you can do to help prevent falls. This information is not intended to replace advice given to you by your health care provider. Make sure you discuss any questions you have with your health care provider. Document Released: 04/12/2009 Document Revised: 11/22/2015 Document Reviewed: 07/21/2014  2017 Elsevier   Glaucoma Introduction Glaucoma happens when the fluid pressure in the eyeball is too high. If the pressure stays high for too long, the eye may become damaged. This can cause a loss of vision. The most common type of glaucoma causes pressure in the eye to go up  slowly. There may be no symptoms at first. Testing for this condition can help to find the condition before damage occurs. Early treatment can often stop vision loss. Follow these instructions at home:  Take medicines only as told by your doctor.  Use your eye drops exactly as told. You will probably need to use these for the rest of  your life.  Exercise often. Talk with your doctor about which types of exercise are safe for you. Avoid standing on your head.  Keep all follow-up visits as told by your doctor. This is important. Contact a doctor if:  Your symptoms get worse. Get help right away if:  You have bad pain in your eye.  You have vision problems.  You have a bad headache in the area around your eye.  You feel sick to your stomach (nauseous) or you throw up (vomit).  You start to have problems with your other eye. This information is not intended to replace advice given to you by your health care provider. Make sure you discuss any questions you have with your health care provider. Document Released: 03/25/2008 Document Revised: 11/22/2015 Document Reviewed: 03/28/2014  2017 Elsevier

## 2016-07-21 NOTE — Progress Notes (Signed)
Subjective:   Jonathan Waller is a 76 y.o. male who presents for an Initial Medicare Annual Wellness Visit.  Review of Systems  No ROS.  Medicare Wellness Visit.  Cardiac Risk Factors include: advanced age (>46men, >21 women);male gender;hypertension;diabetes mellitus;obesity (BMI >30kg/m2)    Objective:    Today's Vitals   07/21/16 1652  BP: 127/77  Pulse: 76  Resp: 14  Temp: 97.9 F (36.6 C)  TempSrc: Oral  SpO2: 98%  Weight: 242 lb 8.1 oz (110 kg)  Height: 6\' 2"  (1.88 m)   Body mass index is 31.14 kg/m.  Current Medications (verified) Outpatient Encounter Prescriptions as of 07/21/2016  Medication Sig  . albuterol (PROVENTIL) (2.5 MG/3ML) 0.083% nebulizer solution Take 3 mLs (2.5 mg total) by nebulization every 6 (six) hours as needed for wheezing or shortness of breath.  . calcium acetate (PHOSLO) 667 MG capsule Take 2 capsules (1,334 mg total) by mouth 3 (three) times daily with meals.  . cetirizine (ZYRTEC) 10 MG tablet Take 10 mg by mouth daily.  Marland Kitchen glucose blood test strip Use as instructed to check your blood sugar daily.E11.8, E11.65, Z79.4  . insulin aspart (NOVOLOG) 100 UNIT/ML injection Inject 0-20 Units into the skin every 4 (four) hours. 150 to 200 -2 units, 201 to 250- 4 units, 251 to 300-6 units, 301 to 350- 8 units, 351 to 400- 10 units, greater than 450 ise 12 units.  . insulin glargine (LANTUS) 100 UNIT/ML injection Inject 0.14 mLs (14 Units total) into the skin daily.  . Insulin Syringe-Needle U-100 25G X 5/8" 1 ML MISC Use as instructed when taking insulin up to 5 times a daily.  . multivitamin (RENA-VIT) TABS tablet Take 1 tablet by mouth at bedtime.  Marland Kitchen nystatin-triamcinolone ointment (MYCOLOG) Apply 1 application topically 2 (two) times daily. Apply to groin area for 2 weeks, twice daily.  . pantoprazole (PROTONIX) 40 MG tablet Take 1 tablet (40 mg total) by mouth daily.  . pantoprazole (PROTONIX) 40 MG tablet Take 1 tablet (40 mg total) by mouth  daily.  . sildenafil (REVATIO) 20 MG tablet Take 60-100 mg by mouth daily as needed (ED).  . simvastatin (ZOCOR) 20 MG tablet Take 1 tablet (20 mg total) by mouth daily.  . tamsulosin (FLOMAX) 0.4 MG CAPS capsule Take 1 capsule (0.4 mg total) by mouth daily.   No facility-administered encounter medications on file as of 07/21/2016.     Allergies (verified) Aspirin   History: Past Medical History:  Diagnosis Date  . BPH (benign prostatic hyperplasia)   . CKD (chronic kidney disease)   . GERD (gastroesophageal reflux disease)   . Gout   . HLD (hyperlipidemia)   . HTN (hypertension)   . IDDM (insulin dependent diabetes mellitus) (HCC)    Past Surgical History:  Procedure Laterality Date  . BACK SURGERY    . NEPHRECTOMY     Family History  Problem Relation Age of Onset  . Diabetes Mother   . Hypertension Mother   . Arthritis Mother    Social History   Occupational History  . Not on file.   Social History Main Topics  . Smoking status: Never Smoker  . Smokeless tobacco: Never Used  . Alcohol use No  . Drug use: Unknown  . Sexual activity: Yes    Partners: Female   Tobacco Counseling Counseling given: Not Answered   Activities of Daily Living In your present state of health, do you have any difficulty performing the following activities: 07/21/2016 05/28/2016  Hearing? Y N  Vision? N N  Difficulty concentrating or making decisions? N N  Walking or climbing stairs? N N  Dressing or bathing? N Y  Doing errands, shopping? N N  Preparing Food and eating ? N -  Using the Toilet? N -  In the past six months, have you accidently leaked urine? N -  Do you have problems with loss of bowel control? N -  Managing your Medications? N -  Managing your Finances? N -  Housekeeping or managing your Housekeeping? N -    Immunizations and Health Maintenance Immunization History  Administered Date(s) Administered  . Influenza, High Dose Seasonal PF 07/21/2016   Health  Maintenance Due  Topic Date Due  . TETANUS/TDAP  04/12/1960  . ZOSTAVAX  04/12/2001    Patient Care Team: Tommie Sams, DO as PCP - General (Family Medicine)  Indicate any recent Medical Services you may have received from other than [facility] providers in the past year (date may be approximate).    Assessment:   This is a routine wellness examination for Jonathan Waller. The goal of the wellness visit is to assist the patient how to close the gaps in care and create a preventative care plan for the patient.   Osteoporosis risk reviewed.  Medications reviewed; taking without issues or barriers.  Safety issues reviewed; smoke detectors in the home. No firearms in the home. Wears seatbelts when driving or riding with others. No violence in the home.  No identified risk were noted; The patient was oriented x 3; appropriate in dress and manner and no objective failures at ADL's or IADL's.   BMI; discussed the importance of a healthy diet, water intake and exercise. Educational material provided.  HTN; followed by PCP.  TDAP and ZOSTAVAX vaccine deferred per patient preference.  Educational material provided.  Influenza high dose vaccine administered L deltoid, tolerated well.  Educational material provided.    Patient Concerns: None at this time. Follow up with PCP as needed.  Hearing/Vision screen Hearing Screening Comments: Followed by [facility] [place], [place]) Hearing aids, bilateral Vision Screening Comments: Followed by Dr. Maisie Fus Last OV 2017 Wears glasses No retinopathy reported Visual acuity not assessed per patient preference since he has regular follow up with his ophthalmologist  Dietary issues and exercise activities discussed: Current Exercise Habits: Home exercise routine (Walks 2 dogs daily), Type of exercise: walking, Time (Minutes): 30, Frequency (Times/Week): 7, Weekly Exercise (Minutes/Week): 210, Intensity: Moderate  Goals    . Increase physical  activity          Continue physical therapy exercises  Walk daily for exercise      Depression Screen PHQ 2/9 Scores 07/21/2016 06/16/2016  PHQ - 2 Score 0 0    Fall Risk Fall Risk  07/21/2016 06/16/2016  Falls in the past year? Yes Yes  Number falls in past yr: 2 or more 2 or more  Injury with Fall? No No  Follow up Falls prevention discussed;Education provided -    Cognitive Function: MMSE - Mini Mental State Exam 07/21/2016  Orientation to time 5  Orientation to Place 5  Registration 3  Attention/ Calculation 5  Recall 2  Recall-comments 2 out of 3 words recalled  Language- name 2 objects 2  Language- repeat 1  Language- follow 3 step command 3  Language- read & follow direction 1  Write a sentence 1  Copy design 1  Total score 29        Screening Tests  Health Maintenance  Topic Date Due  . TETANUS/TDAP  04/12/1960  . ZOSTAVAX  04/12/2001  . INFLUENZA VACCINE  06/09/2017 (Originally 01/29/2016)  . HEMOGLOBIN A1C  11/24/2016  . OPHTHALMOLOGY EXAM  11/26/2016  . FOOT EXAM  07/21/2017  . URINE MICROALBUMIN  07/21/2017  . COLONOSCOPY  06/30/2022  . PNA vac Low Risk Adult  Addressed        Plan:   End of life planning; Advance aging; Advanced directives discussed. Copy of current HCPOA/Living Will requested.  Medicare Attestation I have personally reviewed: The patient's medical and social history Their use of alcohol, tobacco or illicit drugs Their current medications and supplements The patient's functional ability including ADLs,fall risks, home safety risks, cognitive, and hearing and visual impairment Diet and physical activities Evidence for depression   The patient's weight, height, BMI, and visual acuity have been recorded in the chart.  I have made referrals and provided education to the patient based on review of the above and I have provided the patient with a written personalized care plan for preventive services.    During the course of the  visit Jonathan Waller was educated and counseled about the following appropriate screening and preventive services:   Vaccines to include Pneumoccal, Influenza, Hepatitis B, Td, Zostavax, HCV  Electrocardiogram  Colorectal cancer screening  Cardiovascular disease screening  Diabetes screening  Glaucoma screening  Nutrition counseling  Prostate cancer screening  Smoking cessation counseling  Patient Instructions (the written plan) were given to the patient.   Ashok Pall, LPN   [id number]

## 2016-07-21 NOTE — Progress Notes (Signed)
Care was provided under my supervision. I agree with the management as indicated in the note.  Tavarion Babington DO  

## 2016-07-21 NOTE — Patient Instructions (Signed)
We will call with your labs.  Follow up in 3 months.  Take care  Dr. Adriana Simas

## 2016-07-22 LAB — BASIC METABOLIC PANEL
BUN: 26 mg/dL — AB (ref 6–23)
CALCIUM: 9.7 mg/dL (ref 8.4–10.5)
CO2: 24 mEq/L (ref 19–32)
CREATININE: 2.04 mg/dL — AB (ref 0.40–1.50)
Chloride: 107 mEq/L (ref 96–112)
GFR: 41.12 mL/min — AB (ref >60.00)
GLUCOSE: 167 mg/dL — AB (ref 70–99)
POTASSIUM: 4.5 meq/L (ref 3.5–5.1)
Sodium: 138 mEq/L (ref 135–145)

## 2016-07-22 LAB — MICROALBUMIN / CREATININE URINE RATIO
Creatinine,U: 96.2 mg/dL
MICROALB UR: 71.9 mg/dL — AB (ref 0.0–1.9)
Microalb Creat Ratio: 74.7 mg/g — ABNORMAL HIGH (ref 0.0–30.0)

## 2016-07-22 LAB — CBC
HEMATOCRIT: 33.1 % — AB (ref 39.0–52.0)
Hemoglobin: 10.9 g/dL — ABNORMAL LOW (ref 13.0–17.0)
MCHC: 33.1 g/dL (ref 30.0–36.0)
MCV: 90.8 fl (ref 78.0–100.0)
PLATELETS: 205 10*3/uL (ref 150.0–400.0)
RBC: 3.65 Mil/uL — ABNORMAL LOW (ref 4.22–5.81)
RDW: 15.6 % — ABNORMAL HIGH (ref 11.5–15.5)
WBC: 4.8 10*3/uL (ref 4.0–10.5)

## 2016-07-22 NOTE — Assessment & Plan Note (Signed)
At goal. Will continue to monitor closely. BMP today. Will continue to consider adding back ACEI as kidney function stabilizes.

## 2016-07-22 NOTE — Progress Notes (Signed)
Subjective:  Patient ID: Jonathan Waller, male    DOB: [birth year]  Age: 76 y.o. MRN: [id number]  CC: Follow up  HPI:  75 year old male with DM 2, hypertension, hyperlipidemia, CKD, BPH presents for follow up.  HTN  BP well controlled. No meds at this time.  Saw cardiology. Had negative stress and echo (given documented cardiomyopathy during admission in late 2017).  CKD   Creatinine has been improving following acute renal failure.  Seeing nephrology.  Repeating labs today.  DM-2  Sugars in the 110's to 150's (much improved).  Compliant with Lantus 18 and NovoLog 6/6/6.  Social Hx   Social History   Social History  . Marital status: Legally Separated    Spouse name: N/A  . Number of children: N/A  . Years of education: N/A   Social History Main Topics  . Smoking status: Never Smoker  . Smokeless tobacco: Never Used  . Alcohol use No  . Drug use: Unknown  . Sexual activity: Yes    Partners: Female   Other Topics Concern  . None   Social History Narrative  . None    Review of Systems  Constitutional: Negative.   Respiratory: Negative.   Cardiovascular: Negative.    Objective:  BP 127/77   Pulse 76   Temp 97.9 F (36.6 C) (Oral)   Resp 14   Wt 242 lb 6.4 oz (110 kg)   SpO2 98%   BMI 31.12 kg/m   BP/Weight 07/21/2016 07/21/2016 07/01/2016  Systolic BP 127 127 136  Diastolic BP 77 77 84  Wt. (Lbs) 242.51 242.4 247  BMI 31.14 31.12 31.71   Physical Exam  Constitutional: He is oriented to person, place, and time. He appears well-developed. No distress.  Cardiovascular: Normal rate and regular rhythm.   Pulmonary/Chest: Effort normal and breath sounds normal.  Neurological: He is alert and oriented to person, place, and time.  Psychiatric: He has a normal mood and affect.  Vitals reviewed.  Lab Results  Component Value Date   WBC 4.4 06/16/2016   HGB 9.6 (L) 06/16/2016   HCT 29.1 (L) 06/16/2016   PLT 219.0 06/16/2016   GLUCOSE 178 (H)  06/16/2016   CHOL 114 06/09/2016   TRIG 171.0 (H) 06/09/2016   HDL 38.70 (L) 06/09/2016   LDLCALC 42 06/09/2016   ALT 29 06/16/2016   AST 30 06/16/2016   NA 135 06/16/2016   K 5.1 06/16/2016   CL 102 06/16/2016   CREATININE 2.33 (H) 06/16/2016   BUN 40 (H) 06/16/2016   CO2 23 06/16/2016   HGBA1C 10.0 (H) 05/27/2016    Assessment & Plan:   Problem List Items Addressed This Visit    HTN (hypertension) (Chronic)    At goal. Will continue to monitor closely. BMP today. Will continue to consider adding back ACEI as kidney function stabilizes.      DM (diabetes mellitus), type 2 with complications (HCC)    Improved/stable. Not yet due for A1c. We'll continue current insulin regimen.      Relevant Medications   insulin glargine (LANTUS) 100 UNIT/ML injection   CKD (chronic kidney disease) stage 3, GFR 30-59 ml/min    Creatinine has been improving following acute renal failure. Metabolic panel today. Continue close follow-up with nephrology.      Relevant Orders   CBC   Basic metabolic panel   Microalbumin / creatinine urine ratio      Meds ordered this encounter  Medications  . DISCONTD: pantoprazole (PROTONIX) 40 MG  tablet    Sig: Take 1 tablet (40 mg total) by mouth daily.    Dispense:  30 tablet    Refill:  0  . DISCONTD: pantoprazole (PROTONIX) 40 MG tablet    Sig: Take 1 tablet (40 mg total) by mouth daily.    Dispense:  90 tablet    Refill:  3  . DISCONTD: pantoprazole (PROTONIX) 40 MG tablet    Sig: Take 1 tablet (40 mg total) by mouth daily.    Dispense:  30 tablet    Refill:  0  . pantoprazole (PROTONIX) 40 MG tablet    Sig: Take 1 tablet (40 mg total) by mouth daily.    Dispense:  90 tablet    Refill:  3  . insulin glargine (LANTUS) 100 UNIT/ML injection    Sig: Inject 0.14 mLs (14 Units total) into the skin daily.    Dispense:  10 mL    Refill:  2    Follow-up: Return in about 3 months (around 10/19/2016).  Everlene Other DO [facility]

## 2016-07-22 NOTE — Assessment & Plan Note (Signed)
Creatinine has been improving following acute renal failure. Metabolic panel today. Continue close follow-up with nephrology.

## 2016-07-22 NOTE — Assessment & Plan Note (Signed)
Improved/stable. Not yet due for A1c. We'll continue current insulin regimen.

## 2016-07-24 DIAGNOSIS — I10 Essential (primary) hypertension: Secondary | ICD-10-CM | POA: Diagnosis not present

## 2016-07-24 DIAGNOSIS — Z6830 Body mass index (BMI) 30.0-30.9, adult: Secondary | ICD-10-CM | POA: Diagnosis not present

## 2016-07-24 DIAGNOSIS — N179 Acute kidney failure, unspecified: Secondary | ICD-10-CM | POA: Diagnosis not present

## 2016-07-24 DIAGNOSIS — D631 Anemia in chronic kidney disease: Secondary | ICD-10-CM | POA: Diagnosis not present

## 2016-07-24 DIAGNOSIS — Z905 Acquired absence of kidney: Secondary | ICD-10-CM | POA: Diagnosis not present

## 2016-07-24 DIAGNOSIS — N2581 Secondary hyperparathyroidism of renal origin: Secondary | ICD-10-CM | POA: Diagnosis not present

## 2016-07-24 DIAGNOSIS — N184 Chronic kidney disease, stage 4 (severe): Secondary | ICD-10-CM | POA: Diagnosis not present

## 2016-07-24 DIAGNOSIS — I429 Cardiomyopathy, unspecified: Secondary | ICD-10-CM | POA: Diagnosis not present

## 2016-07-24 DIAGNOSIS — E1129 Type 2 diabetes mellitus with other diabetic kidney complication: Secondary | ICD-10-CM | POA: Diagnosis not present

## 2016-08-01 ENCOUNTER — Other Ambulatory Visit: Payer: Self-pay | Admitting: Family Medicine

## 2016-08-01 MED ORDER — INSULIN ASPART 100 UNIT/ML ~~LOC~~ SOLN: 6.0000 [IU] | Freq: Three times a day (TID) | SUBCUTANEOUS | 6 refills | Status: DC

## 2016-08-08 ENCOUNTER — Telehealth: Payer: Self-pay | Admitting: Family Medicine

## 2016-08-08 MED ORDER — "INSULIN SYRINGE-NEEDLE U-100 25G X 5/8"" 1 ML MISC": 0 refills | Status: DC

## 2016-08-08 MED ORDER — INSULIN GLARGINE 100 UNIT/ML ~~LOC~~ SOLN: 14.0000 [IU] | SUBCUTANEOUS | 3 refills | Status: DC

## 2016-08-08 MED ORDER — INSULIN ASPART 100 UNIT/ML ~~LOC~~ SOLN: 6.0000 [IU] | Freq: Three times a day (TID) | SUBCUTANEOUS | 6 refills | Status: DC

## 2016-08-08 NOTE — Telephone Encounter (Signed)
Pt called stating that the pharmacy needs a new rx for insulin aspart (NOVOLOG) 100 UNIT/ML injection, insulin glargine (LANTUS) 100 UNIT/ML injection, and Insulin syringe-needle u- 100 25g x5/8" 1 ml misc. Please advise, thank you!  Pharmacy - [facility] - [place], [place] - [address]  Call pt @ [phone]

## 2016-08-08 NOTE — Telephone Encounter (Signed)
Pt called and informed of RX sent in.

## 2016-08-14 ENCOUNTER — Ambulatory Visit (INDEPENDENT_AMBULATORY_CARE_PROVIDER_SITE_OTHER): Payer: Medicare Other | Admitting: Family Medicine

## 2016-08-14 ENCOUNTER — Encounter: Payer: Self-pay | Admitting: Family Medicine

## 2016-08-14 DIAGNOSIS — E118 Type 2 diabetes mellitus with unspecified complications: Secondary | ICD-10-CM

## 2016-08-14 DIAGNOSIS — Z794 Long term (current) use of insulin: Secondary | ICD-10-CM

## 2016-08-14 MED ORDER — CETIRIZINE HCL 10 MG PO TABS: 10.0000 mg | ORAL_TABLET | Freq: Every day | ORAL | 3 refills | Status: AC

## 2016-08-14 MED ORDER — PANTOPRAZOLE SODIUM 40 MG PO TBEC: 40.0000 mg | DELAYED_RELEASE_TABLET | Freq: Every day | ORAL | 3 refills | Status: AC

## 2016-08-14 NOTE — Assessment & Plan Note (Signed)
Established problem, recent worsening. Increasing Lantus to 20 units. Continue Novolog 6/6/6. Keep logs of fasting and post prandials. Follow up in ~ 2 weeks with [facility] PharmD.

## 2016-08-14 NOTE — Progress Notes (Signed)
Pre visit review using our clinic review tool, if applicable. No additional management support is needed unless otherwise documented below in the visit note. 

## 2016-08-14 NOTE — Patient Instructions (Addendum)
Increase the Lantus to 20 units.  Continue the Novolog (6/6/6).  Keep a log of your sugars  We will have you see our pharmacist in ~ 2 weeks.  Take care  Dr. Adriana Simas

## 2016-08-14 NOTE — Progress Notes (Signed)
Subjective:  Patient ID: Jonathan Waller, male    DOB: 06-Jul-1940  Age: 76 y.o. MRN: [id number]  CC: Increasing blood sugars  HPI:  76 year old male with DM 2, CKD, hypertension, hyperlipidemia, anemia, BPH presents with complaints of a rising blood sugars.  Patient was recently out of his insulin due to a pharmacy issue. He has been back on his insulin for approximately 1 week. At her last visit, his blood sugars well controlled. He states that he has recently had higher blood sugars. They seem to be worsening. He states that they're ranging from the 150s to 190s predominantly. At our last visit the majority of his sugars were below 150s. He endorses compliance with Lantus 18 units and NovoLog 6/6/6. No dietary changes. The only thing that has changes a decrease in physical activity. He has no other compulsive concerns at this time.   Social Hx   Social History   Social History  . Marital status: Legally Separated    Spouse name: N/A  . Number of children: N/A  . Years of education: N/A   Social History Main Topics  . Smoking status: Never Smoker  . Smokeless tobacco: Never Used  . Alcohol use No  . Drug use: Unknown  . Sexual activity: Yes    Partners: Female   Other Topics Concern  . None   Social History Narrative  . None    Review of Systems  Constitutional: Negative.   Respiratory: Negative.   Cardiovascular: Negative.   Endocrine:       Rising blood sugars.   Objective:  BP 109/70 (BP Location: Left Arm, Patient Position: Sitting, Cuff Size: Normal)   Pulse 77   Temp 97.7 F (36.5 C) (Oral)   Wt 245 lb 12.8 oz (111.5 kg)   SpO2 98%   BMI 31.56 kg/m   BP/Weight 08/14/2016 07/21/2016 07/21/2016  Systolic BP 109 127 127  Diastolic BP 70 77 77  Wt. (Lbs) 245.8 242.51 242.4  BMI 31.56 31.14 31.12   Physical Exam  Constitutional: He is oriented to person, place, and time. He appears well-developed. No distress.  HENT:  Head: Normocephalic and atraumatic.    Pulmonary/Chest: Effort normal.  Neurological: He is alert and oriented to person, place, and time.  Psychiatric: He has a normal mood and affect.  Vitals reviewed.  Lab Results  Component Value Date   WBC 4.8 07/21/2016   HGB 10.9 (L) 07/21/2016   HCT 33.1 (L) 07/21/2016   PLT 205.0 07/21/2016   GLUCOSE 167 (H) 07/21/2016   CHOL 114 06/09/2016   TRIG 171.0 (H) 06/09/2016   HDL 38.70 (L) 06/09/2016   LDLCALC 42 06/09/2016   ALT 29 06/16/2016   AST 30 06/16/2016   NA 138 07/21/2016   K 4.5 07/21/2016   CL 107 07/21/2016   CREATININE 2.04 (H) 07/21/2016   BUN 26 (H) 07/21/2016   CO2 24 07/21/2016   HGBA1C 10.0 (H) 05/27/2016   MICROALBUR 71.9 (H) 07/21/2016    Assessment & Plan:   Problem List Items Addressed This Visit    DM (diabetes mellitus), type 2 with complications (HCC)    Established problem, recent worsening. Increasing Lantus to 20 units. Continue Novolog 6/6/6. Keep logs of fasting and post prandials. Follow up in ~ 2 weeks with [facility] PharmD.         Meds ordered this encounter  Medications  . pantoprazole (PROTONIX) 40 MG tablet    Sig: Take 1 tablet (40 mg total) by mouth daily.  Dispense:  90 tablet    Refill:  3  . cetirizine (ZYRTEC) 10 MG tablet    Sig: Take 1 tablet (10 mg total) by mouth daily.    Dispense:  90 tablet    Refill:  3    Follow-up: ~ 2 weeks with Mal Misty (PharmD)  Everlene Other DO [facility]

## 2016-08-22 ENCOUNTER — Other Ambulatory Visit: Payer: Self-pay | Admitting: Family Medicine

## 2016-09-01 ENCOUNTER — Encounter: Payer: Self-pay | Admitting: Pharmacist

## 2016-09-01 ENCOUNTER — Ambulatory Visit (INDEPENDENT_AMBULATORY_CARE_PROVIDER_SITE_OTHER): Payer: Medicare Other | Admitting: Pharmacist

## 2016-09-01 DIAGNOSIS — E785 Hyperlipidemia, unspecified: Secondary | ICD-10-CM

## 2016-09-01 DIAGNOSIS — Z794 Long term (current) use of insulin: Secondary | ICD-10-CM | POA: Diagnosis not present

## 2016-09-01 DIAGNOSIS — I10 Essential (primary) hypertension: Secondary | ICD-10-CM | POA: Diagnosis not present

## 2016-09-01 DIAGNOSIS — E118 Type 2 diabetes mellitus with unspecified complications: Secondary | ICD-10-CM

## 2016-09-01 MED ORDER — INSULIN GLARGINE 100 UNIT/ML ~~LOC~~ SOLN: 22.0000 [IU] | Freq: Every day | SUBCUTANEOUS | 0 refills | Status: DC

## 2016-09-01 MED ORDER — INSULIN ASPART 100 UNIT/ML ~~LOC~~ SOLN: 5.0000 [IU] | Freq: Three times a day (TID) | SUBCUTANEOUS | 6 refills | Status: DC

## 2016-09-01 NOTE — Assessment & Plan Note (Signed)
Hypertension longstanding diagnosed currently controlled.  Continue current therapy.

## 2016-09-01 NOTE — Assessment & Plan Note (Signed)
Diabetes longstanding diagnosed currently uncontrolled but with improved CBGs. Patient denies hypoglycemic events and is able to verbalize appropriate hypoglycemia management plan. Patient reports adherence with medication. Control is suboptimal due to insulin resistance. Following discussion and approval by Dr Adriana Simas, the following medication changes were made:  -Increased dose of basal insulin Lantus (insulin glargine) to 22 units daily.  -Adjusted dose of rapid insulin Novolog (insulin aspart) to 6 units before breakfast, 5 units before lunch, and 8 units before supper.  Instructed patient to stop taking Novolog QHS and to avoid taking if he does not eat.   -Instructed to call clinic if CBGs <70 and to call clinic if he starts seeing more 200s instead of 100s.   -Next A1C anticipated at next visit with Dr Adriana Simas.

## 2016-09-01 NOTE — Progress Notes (Signed)
S:    Patient arrives in good spirits ambulating without assistance.  Presents for diabetes evaluation, education, and management at the request of Dr Adriana Simas. Patient was referred on 08/14/16.  Patient was last seen by Primary Care Provider on 08/14/16 with Dr Adriana Simas.   Patient states nephrology has instructed him to continue Phoslo and he has followup with nephrology next month.   Patient reports Diabetes was diagnosed in ~2000.   Patient denies adherence with medications.  Current diabetes medications include: Lantus 20 units daily, Novolog 6 units TID + QHS (Taking Novolog 6 units at bedtime not as prescribed)  Patient reports hypoglycemic events. Reports proper treatment knowledge  Patient reported dietary habits: Eats 3 meals/day Breakfast:fiber one + milk  Lunch: apple or orange Dinner:meat (pork, beef, fish - baked) + vegetable (brocolli, asparagus, cauliflower, green beans, white potatoes)  Snacks:no sweets, apples or oranges Drinks:coffee (black), crystal light tea, water.   Patient reported exercise habits: Limited by feet.  Needs Dr Shiela Mayer signature for Diabetic shoes.  Plans to exercise again once gets new shoes.     Patient reports nocturia 3x/night.  Patient denies pain/burning upon urination.  Patient denies neuropathy. Patient denies visual changes. Last eye exam 08/21/2016.   Patient reports self foot exams. Denies changes or problems.   O:  Physical Exam  Vitals reviewed.   Review of Systems  Constitutional: Negative.   HENT: Negative.   Eyes: Negative.   Respiratory: Negative.   Cardiovascular: Negative.   Gastrointestinal: Negative.   Genitourinary: Negative.   Musculoskeletal: Negative.   Skin: Negative.   Neurological: Negative.   Endo/Heme/Allergies: Negative.   Psychiatric/Behavioral: Negative.      Lab Results  Component Value Date   HGBA1C 10.0 (H) 05/27/2016   Vitals:   09/01/16 1013  BP: 106/71  Pulse: 66    Home fasting CBG: 133, 144,  158, 125, 132, 145, 132, 166, 120, 131, 161, 125 mg/dL  Before Lunch CBG: 225, 168, 125, 113, 146, 195, 173, 194, 146, 123 mg/dL  Before Supper CBG: 750, 168, 96, 82, 90, 171, 188, 117, 144, 168, 68 mg/dL  At Bedtime CBG: 518, 335, 215, 116, 181, 148, 170, 150, 185, 154, 192 mg/dL.    A/P: Diabetes longstanding diagnosed currently uncontrolled but with improved CBGs. Patient denies hypoglycemic events and is able to verbalize appropriate hypoglycemia management plan. Patient reports adherence with medication. Control is suboptimal due to insulin resistance. Following discussion and approval by Dr Adriana Simas, the following medication changes were made:  -Increased dose of basal insulin Lantus (insulin glargine) to 22 units daily.  -Adjusted dose of rapid insulin Novolog (insulin aspart) to 6 units before breakfast, 5 units before lunch, and 8 units before supper.  Instructed patient to stop taking Novolog QHS and to avoid taking if he does not eat.   -Instructed to call clinic if CBGs <70 and to call clinic if he starts seeing more 200s instead of 100s.   -Next A1C anticipated at next visit with Dr Adriana Simas.     ASCVD risk greater than 7.5% with LDL at goal <100.  Continued simvastatin 20 mg daily.  Patient not currently on aspirin due to GI ulcer with use of Alka Seltzer.  Patient may be candidate for low dose aspirin.  Will ask Dr Adriana Simas to consider during his next visit.    Hypertension longstanding diagnosed currently controlled.  Continue current therapy.    Written patient instructions provided.  Total time in face to face counseling 40  minutes.   Follow up with Dr Adriana Simas next month (April).   Patient was seen with Dr Adriana Simas today in clinic and medication changes were discussed and approved prior to initiation

## 2016-09-01 NOTE — Progress Notes (Signed)
Care was provided under my supervision. I agree with the management as indicated in the note.  Aleeyah Bensen DO  

## 2016-09-01 NOTE — Assessment & Plan Note (Signed)
ASCVD risk greater than 7.5% with LDL at goal <100.  Continued simvastatin 20 mg daily.  Patient not currently on aspirin due to GI ulcer with use of Alka Seltzer.  Patient may be candidate for low dose aspirin.  Will ask Dr Adriana Simas to consider during his next visit.

## 2016-09-01 NOTE — Patient Instructions (Addendum)
Increase Novolog to 6 units before breakfast, 5 units before lunch and 8 units before supper  Increase Lantus to 22 units daily   Please call clinic if you have blood glucose less than 70 mg/dL.   Followup with Dr Adriana Simas in April and call clinic if your blood glucose values are getting higher.

## 2016-09-02 ENCOUNTER — Telehealth: Payer: Self-pay | Admitting: Family Medicine

## 2016-09-02 NOTE — Telephone Encounter (Signed)
Letter was completed and placed up front.

## 2016-09-02 NOTE — Telephone Encounter (Signed)
Pt dropped off paper work for Dr. Adriana Simas to complete to get diabetic shoes and also back to work papers. Papers are up front in color folder.

## 2016-09-04 NOTE — Telephone Encounter (Signed)
Pt called asking if letter and paperwork was done. Told pt letter was up front ready to be picked up. Is his diabetic shoe paperwork done. Please advise, thank you!  Call pt @ [phone]

## 2016-09-04 NOTE — Telephone Encounter (Signed)
Let patient know that we have not had a chance to complete. We have up to five days to complete. I will make provider aware and have him fill out at his soonest convinence .

## 2016-09-08 ENCOUNTER — Encounter: Payer: Self-pay | Admitting: Family Medicine

## 2016-09-08 NOTE — Telephone Encounter (Signed)
pts daughter was called and told that paperwork was ready for pt to pick up.

## 2016-10-20 ENCOUNTER — Other Ambulatory Visit: Payer: Self-pay | Admitting: Family Medicine

## 2016-10-24 ENCOUNTER — Encounter: Payer: Self-pay | Admitting: Family Medicine

## 2016-10-24 ENCOUNTER — Ambulatory Visit (INDEPENDENT_AMBULATORY_CARE_PROVIDER_SITE_OTHER): Payer: Medicare Other | Admitting: Family Medicine

## 2016-10-24 VITALS — BP 128/74 | HR 85 | Temp 98.2°F | Wt 249.2 lb

## 2016-10-24 DIAGNOSIS — I10 Essential (primary) hypertension: Secondary | ICD-10-CM

## 2016-10-24 DIAGNOSIS — N183 Chronic kidney disease, stage 3 unspecified: Secondary | ICD-10-CM

## 2016-10-24 DIAGNOSIS — E785 Hyperlipidemia, unspecified: Secondary | ICD-10-CM

## 2016-10-24 DIAGNOSIS — Z794 Long term (current) use of insulin: Secondary | ICD-10-CM

## 2016-10-24 DIAGNOSIS — E118 Type 2 diabetes mellitus with unspecified complications: Secondary | ICD-10-CM

## 2016-10-24 LAB — COMPREHENSIVE METABOLIC PANEL
ALT: 19 U/L (ref 0–53)
AST: 16 U/L (ref 0–37)
Albumin: 3.9 g/dL (ref 3.5–5.2)
Alkaline Phosphatase: 61 U/L (ref 39–117)
BUN: 31 mg/dL — ABNORMAL HIGH (ref 6–23)
CALCIUM: 9.8 mg/dL (ref 8.4–10.5)
CHLORIDE: 108 meq/L (ref 96–112)
CO2: 25 meq/L (ref 19–32)
Creatinine, Ser: 1.97 mg/dL — ABNORMAL HIGH (ref 0.40–1.50)
GFR: 42.78 mL/min — AB (ref >60.00)
Glucose, Bld: 120 mg/dL — ABNORMAL HIGH (ref 70–99)
Potassium: 4.2 mEq/L (ref 3.5–5.1)
Sodium: 139 mEq/L (ref 135–145)
Total Bilirubin: 0.3 mg/dL (ref 0.2–1.2)
Total Protein: 7.5 g/dL (ref 6.0–8.3)

## 2016-10-24 LAB — CBC
HEMATOCRIT: 39.4 % (ref 39.0–52.0)
HEMOGLOBIN: 13 g/dL (ref 13.0–17.0)
MCHC: 32.9 g/dL (ref 30.0–36.0)
MCV: 89 fl (ref 78.0–100.0)
Platelets: 177 10*3/uL (ref 150.0–400.0)
RBC: 4.43 Mil/uL (ref 4.22–5.81)
RDW: 14.4 % (ref 11.5–15.5)
WBC: 2.9 10*3/uL — ABNORMAL LOW (ref 4.0–10.5)

## 2016-10-24 LAB — HEMOGLOBIN A1C: Hgb A1c MFr Bld: 9 % — ABNORMAL HIGH (ref 4.6–6.5)

## 2016-10-24 NOTE — Progress Notes (Signed)
Pre visit review using our clinic review tool, if applicable. No additional management support is needed unless otherwise documented below in the visit note. 

## 2016-10-24 NOTE — Assessment & Plan Note (Signed)
Metabolic panel today. Needs continued follow-up with nephrology.

## 2016-10-24 NOTE — Assessment & Plan Note (Signed)
At goal on Norvasc. Continue.  

## 2016-10-24 NOTE — Patient Instructions (Signed)
Follow up in 6 months  We will call with your results.  Take care  Dr. Adriana Simas

## 2016-10-24 NOTE — Progress Notes (Signed)
Subjective:  Patient ID: Jonathan Waller, male    DOB: [birth year]  Age: 76 y.o. MRN: [id number]  CC: Follow up  HPI:  76 year old male with DM 2, chronic kidney disease, hypertension, hyperlipidemia presents for follow-up.  DM-2  Sugars mostly below 150.  Needs A1C today.  Compliant with insulin - Lantus 22 units, Novolog 6/6/8.  HTN  At goal.  Norvasc added in Jan by Nephrology.  Endorses compliance.   HLD   At goal on Zocor.  CKD  Following with nephrology.  Urinating without difficultly.  Needs labs today.  Social Hx   Social History   Social History  . Marital status: Legally Separated    Spouse name: N/A  . Number of children: N/A  . Years of education: N/A   Social History Main Topics  . Smoking status: Never Smoker  . Smokeless tobacco: Never Used  . Alcohol use No  . Drug use: Unknown  . Sexual activity: Yes    Partners: Female   Other Topics Concern  . None   Social History Narrative  . None    Review of Systems  Constitutional: Negative.   Respiratory: Negative.   Cardiovascular: Negative.    Objective:  BP 128/74   Pulse 85   Temp 98.2 F (36.8 C) (Oral)   Wt 249 lb 4 oz (113.1 kg)   SpO2 94%   BMI 32.00 kg/m   BP/Weight 10/24/2016 09/01/2016 08/14/2016  Systolic BP 128 106 109  Diastolic BP 74 71 70  Wt. (Lbs) 249.25 249 245.8  BMI 32 31.97 31.56   Physical Exam  Constitutional: He is oriented to person, place, and time. He appears well-developed. No distress.  Cardiovascular: Normal rate and regular rhythm.   Pulmonary/Chest: Effort normal and breath sounds normal.  Neurological: He is alert and oriented to person, place, and time.  Psychiatric: He has a normal mood and affect.  Vitals reviewed.   Lab Results  Component Value Date   WBC 4.8 07/21/2016   HGB 10.9 (L) 07/21/2016   HCT 33.1 (L) 07/21/2016   PLT 205.0 07/21/2016   GLUCOSE 167 (H) 07/21/2016   CHOL 114 06/09/2016   TRIG 171.0 (H) 06/09/2016   HDL 38.70 (L) 06/09/2016   LDLCALC 42 06/09/2016   ALT 29 06/16/2016   AST 30 06/16/2016   NA 138 07/21/2016   K 4.5 07/21/2016   CL 107 07/21/2016   CREATININE 2.04 (H) 07/21/2016   BUN 26 (H) 07/21/2016   CO2 24 07/21/2016   HGBA1C 10.0 (H) 05/27/2016   MICROALBUR 71.9 (H) 07/21/2016    Assessment & Plan:   Problem List Items Addressed This Visit    CKD (chronic kidney disease) stage 3, GFR 30-59 ml/min    Metabolic panel today. Needs continued follow-up with nephrology.      Relevant Orders   CBC   DM (diabetes mellitus), type 2 with complications (HCC) - Primary    Reasonably well controlled. A1C today. Continue current insulin regimen.      Relevant Orders   Hemoglobin A1c   Comprehensive metabolic panel   HTN (hypertension) (Chronic)    At goal on Norvasc. Continue.      Relevant Medications   amLODipine (NORVASC) 5 MG tablet   Hyperlipidemia    At goal on Zocor. Continue.      Relevant Medications   amLODipine (NORVASC) 5 MG tablet     Meds ordered this encounter  Medications  . amLODipine (NORVASC) 5 MG tablet  Sig: Take 5 mg by mouth daily.   Follow-up: Return in about 6 months (around 04/25/2017).  Everlene Other DO [facility]

## 2016-10-24 NOTE — Assessment & Plan Note (Signed)
Reasonably well controlled. A1C today. Continue current insulin regimen.

## 2016-10-24 NOTE — Assessment & Plan Note (Signed)
At goal on Zocor. Continue. 

## 2016-10-27 ENCOUNTER — Telehealth: Payer: Self-pay | Admitting: Pharmacist

## 2016-10-27 DIAGNOSIS — E118 Type 2 diabetes mellitus with unspecified complications: Secondary | ICD-10-CM

## 2016-10-27 DIAGNOSIS — Z794 Long term (current) use of insulin: Principal | ICD-10-CM

## 2016-10-27 MED ORDER — INSULIN ASPART 100 UNIT/ML ~~LOC~~ SOLN: 8.0000 [IU] | Freq: Three times a day (TID) | SUBCUTANEOUS | 6 refills | Status: AC

## 2016-10-27 MED ORDER — INSULIN GLARGINE 100 UNIT/ML ~~LOC~~ SOLN: 24.0000 [IU] | Freq: Every day | SUBCUTANEOUS | 0 refills | Status: AC

## 2016-10-27 NOTE — Telephone Encounter (Signed)
10/27/16  S/O:  Called patient today for diabetes evaluation, education, and management at the request of Dr Adriana Simas. Patient was referred on 08/14/16.  Patient was last seen by Primary Care Provider on 10/24/16 with Dr Adriana Simas.  At this visit patients A1C remained elevated at 9%. Called patient today to followup CBGs as he is now in [place] for work.  Patient was able to provide past 2 weeks of CBGs.  He states his CBgs are likely elevated due to eating more fruits.  He denies s/sx of hypoglycemia but reports proper treatment knowledge.   Current DM Medications: Lantus 22 units daily, Novolog 6/6/8 units TID before meals  Before Breakfast CBGs: 166, 165, 211, 160, 135, 197, 154, 159, 198, 101, 175, 110, 182, 158  Before Lunch CBGs: 215, 124, 208, 107, 156, 252, 179, 228, 247, 123, 182, 88, 181 Before Dinner CBGs: 104, 122, 141, 113, 112, 156, 249, 199, 256, 133, 162, 203, 181, 201 At Bedtime CBGs: 141, 106, 241, 218, 120, 202, 168, 118, 173, 195, 169, 165, 226, 261   A/P:  Diabetes currently uncontrolled.  Patient denies s/sx of hypoglycemia Following discussion and approval by Dr Adriana Simas, the following medication changes were made:  -Increase Lantus to 24 units daily -Increase Novolog to 8 units before breakfast, 8 units before dinner, and 10 units before supper -Counseled on signs/symptoms/treatment of hypoglycemia and instructed to call clinic if expeirences hypoglycemia -Will followup via telephone in 1 week   Hazle Nordmann, PharmD, [facility] PGY2 Pharmacy Resident [phone]

## 2016-10-27 NOTE — Telephone Encounter (Signed)
Care was provided under my supervision. I agree with the management as indicated in the note.  Franky Reier DO  

## 2016-11-18 ENCOUNTER — Other Ambulatory Visit: Payer: Self-pay

## 2016-11-18 MED ORDER — GLUCOSE BLOOD VI STRP: ORAL_STRIP | 4 refills | Status: AC

## 2016-11-26 NOTE — Telephone Encounter (Signed)
Pt dropped off form again stating that there need to be some additional things add-- -Prescription for diabetic shoes  -Diabetic Statements within the last 43m -Recent clinical notes in the last 22m Please advise pt when completed  Placed in Dr.Cooks  Colored folder up front

## 2016-11-26 NOTE — Telephone Encounter (Signed)
I guess just print out the notes if I have completed the rest.

## 2016-11-26 NOTE — Telephone Encounter (Addendum)
Form placed in your red folder for completion .  Looks like you have already completed.  Do I just need to print out office notes for the past 3 months in regards to diabetes .  Please advise.

## 2016-11-27 NOTE — Telephone Encounter (Signed)
I printed wrote out script for you to sign for diabetic shoes. Please sign in your folder . Thanks.

## 2016-12-02 ENCOUNTER — Ambulatory Visit (INDEPENDENT_AMBULATORY_CARE_PROVIDER_SITE_OTHER): Payer: Medicare Other | Admitting: Family Medicine

## 2016-12-02 ENCOUNTER — Encounter: Payer: Self-pay | Admitting: Family Medicine

## 2016-12-02 ENCOUNTER — Ambulatory Visit (INDEPENDENT_AMBULATORY_CARE_PROVIDER_SITE_OTHER): Payer: Medicare Other

## 2016-12-02 VITALS — BP 130/76 | HR 80 | Temp 97.8°F | Resp 16 | Wt 251.2 lb

## 2016-12-02 DIAGNOSIS — R310 Gross hematuria: Secondary | ICD-10-CM | POA: Insufficient documentation

## 2016-12-02 DIAGNOSIS — K56609 Unspecified intestinal obstruction, unspecified as to partial versus complete obstruction: Secondary | ICD-10-CM | POA: Diagnosis not present

## 2016-12-02 DIAGNOSIS — R319 Hematuria, unspecified: Secondary | ICD-10-CM

## 2016-12-02 LAB — POCT URINALYSIS DIPSTICK
BILIRUBIN UA: NEGATIVE
GLUCOSE UA: NEGATIVE
KETONES UA: NEGATIVE
Leukocytes, UA: NEGATIVE
Nitrite, UA: NEGATIVE
Protein, UA: 300
SPEC GRAV UA: 1.02 (ref 1.010–1.025)
Urobilinogen, UA: 0.2 E.U./dL
pH, UA: 6 (ref 5.0–8.0)

## 2016-12-02 LAB — BASIC METABOLIC PANEL
BUN: 48 mg/dL — ABNORMAL HIGH (ref 6–23)
CO2: 23 meq/L (ref 19–32)
Calcium: 9.6 mg/dL (ref 8.4–10.5)
Chloride: 106 mEq/L (ref 96–112)
Creatinine, Ser: 2.43 mg/dL — ABNORMAL HIGH (ref 0.40–1.50)
GFR: 33.57 mL/min — AB (ref >60.00)
GLUCOSE: 124 mg/dL — AB (ref 70–99)
Potassium: 4.7 mEq/L (ref 3.5–5.1)
SODIUM: 137 meq/L (ref 135–145)

## 2016-12-02 LAB — URINALYSIS, MICROSCOPIC ONLY

## 2016-12-02 LAB — PSA: PSA: 5.47 ng/mL — ABNORMAL HIGH (ref 0.10–4.00)

## 2016-12-02 NOTE — Patient Instructions (Addendum)
Continue your meds.  We will call with the results.  Follow up at end of July (after 27).  Take care  Dr. Adriana Simas

## 2016-12-02 NOTE — Assessment & Plan Note (Signed)
New problem. Uncertain etiology at this time. Recent gross hematuria, hematospermia, and back pain. Urinalysis with trace blood today in addition to proteinuria which is chronic. Prostate exam unremarkable. My preliminary read on the KUB was negative. Will await results. Planning to refer to urology.

## 2016-12-02 NOTE — Progress Notes (Signed)
Subjective:  Patient ID: Jonathan Waller, male    DOB: September 27, 1940  Age: 76 y.o. MRN: [id number]  CC: Hematuria, Back pain, Hematospermia  HPI:  76 year old male with an extensive past medical history including hypertension, DM 2 with complications, CKD, BPH presents with the above complaints.  Patient reports that 4-5 days ago he had hematuria, hematospermia and bilateral low back pain. Subsequently resolved. He has no back pain at this time. He denies any difficulty urinating but states that he has been urinating more frequently. He's had no further hematuria workup as per me. He does have a history of BPH. No fever or chills. No reports of flank pain. No known inciting factor. No known exacerbating or relieving factors. He has no other complaints or concerns at this time.  Social Hx   Social History   Social History  . Marital status: Legally Separated    Spouse name: N/A  . Number of children: N/A  . Years of education: N/A   Social History Main Topics  . Smoking status: Never Smoker  . Smokeless tobacco: Never Used  . Alcohol use No  . Drug use: Unknown  . Sexual activity: Yes    Partners: Female   Other Topics Concern  . None   Social History Narrative  . None   Review of Systems  Genitourinary: Positive for frequency and hematuria.       Hematospermia.   Musculoskeletal: Positive for back pain.   Objective:  BP 130/76   Pulse 80   Temp 97.8 F (36.6 C) (Oral)   Resp 16   Wt 251 lb 4 oz (114 kg)   SpO2 97%   BMI 32.26 kg/m   BP/Weight 12/02/2016 10/24/2016 09/01/2016  Systolic BP 130 128 106  Diastolic BP 76 74 71  Wt. (Lbs) 251.25 249.25 249  BMI 32.26 32 31.97    Physical Exam  Constitutional: He is oriented to person, place, and time. He appears well-developed. No distress.  Pulmonary/Chest: Effort normal and breath sounds normal.  Genitourinary: Prostate normal. Prostate is not tender.  Musculoskeletal:  No CVA tenderness.  Neurological: He is  alert and oriented to person, place, and time.  Psychiatric: He has a normal mood and affect.  Vitals reviewed.   Lab Results  Component Value Date   WBC 2.9 (L) 10/24/2016   HGB 13.0 10/24/2016   HCT 39.4 10/24/2016   PLT 177.0 10/24/2016   GLUCOSE 120 (H) 10/24/2016   CHOL 114 06/09/2016   TRIG 171.0 (H) 06/09/2016   HDL 38.70 (L) 06/09/2016   LDLCALC 42 06/09/2016   ALT 19 10/24/2016   AST 16 10/24/2016   NA 139 10/24/2016   K 4.2 10/24/2016   CL 108 10/24/2016   CREATININE 1.97 (H) 10/24/2016   BUN 31 (H) 10/24/2016   CO2 25 10/24/2016   HGBA1C 9.0 (H) 10/24/2016   MICROALBUR 71.9 (H) 07/21/2016    Assessment & Plan:   Problem List Items Addressed This Visit      Genitourinary   Gross hematuria - Primary    New problem. Uncertain etiology at this time. Recent gross hematuria, hematospermia, and back pain. Urinalysis with trace blood today in addition to proteinuria which is chronic. Prostate exam unremarkable. My preliminary read on the KUB was negative. Will await results. Planning to refer to urology.      Relevant Orders   POCT Urinalysis Dipstick (Completed)   Urine Microscopic Only   Basic metabolic panel   PSA   DG Abd  1 View     Follow-up: July  Everlene Other DO [facility]

## 2016-12-02 NOTE — Telephone Encounter (Signed)
Paperwork placed up front for pick up.

## 2016-12-03 ENCOUNTER — Telehealth: Payer: Self-pay | Admitting: *Deleted

## 2016-12-03 NOTE — Telephone Encounter (Signed)
Patients daughter call returned back in regards to lab results  Per DPR. See labs results for documentation.

## 2016-12-03 NOTE — Telephone Encounter (Signed)
Patients daughter requested lab results Contact Rinaldo Cloud [phone]

## 2016-12-03 NOTE — Telephone Encounter (Signed)
Please advise with lab results, thanks

## 2016-12-04 ENCOUNTER — Other Ambulatory Visit: Payer: Self-pay | Admitting: Family Medicine

## 2016-12-04 DIAGNOSIS — R310 Gross hematuria: Secondary | ICD-10-CM

## 2016-12-04 DIAGNOSIS — R972 Elevated prostate specific antigen [PSA]: Secondary | ICD-10-CM

## 2016-12-05 ENCOUNTER — Ambulatory Visit
Admission: RE | Admit: 2016-12-05 | Discharge: 2016-12-05 | Disposition: A | Discharge status: Home / Self Care | Payer: Medicare Other | Source: Ambulatory Visit | Attending: Nephrology | Admitting: Nephrology

## 2016-12-05 ENCOUNTER — Other Ambulatory Visit: Payer: Self-pay | Admitting: Nephrology

## 2016-12-05 DIAGNOSIS — D631 Anemia in chronic kidney disease: Secondary | ICD-10-CM | POA: Diagnosis not present

## 2016-12-05 DIAGNOSIS — R310 Gross hematuria: Secondary | ICD-10-CM | POA: Diagnosis not present

## 2016-12-05 DIAGNOSIS — Z905 Acquired absence of kidney: Secondary | ICD-10-CM | POA: Diagnosis not present

## 2016-12-05 DIAGNOSIS — R361 Hematospermia: Secondary | ICD-10-CM | POA: Diagnosis not present

## 2016-12-05 DIAGNOSIS — N184 Chronic kidney disease, stage 4 (severe): Secondary | ICD-10-CM

## 2016-12-05 DIAGNOSIS — I10 Essential (primary) hypertension: Secondary | ICD-10-CM | POA: Diagnosis not present

## 2016-12-05 DIAGNOSIS — Z6830 Body mass index (BMI) 30.0-30.9, adult: Secondary | ICD-10-CM | POA: Diagnosis not present

## 2016-12-05 DIAGNOSIS — I429 Cardiomyopathy, unspecified: Secondary | ICD-10-CM | POA: Diagnosis not present

## 2016-12-05 DIAGNOSIS — N189 Chronic kidney disease, unspecified: Secondary | ICD-10-CM | POA: Diagnosis not present

## 2016-12-05 DIAGNOSIS — N179 Acute kidney failure, unspecified: Secondary | ICD-10-CM | POA: Diagnosis not present

## 2016-12-05 DIAGNOSIS — N2581 Secondary hyperparathyroidism of renal origin: Secondary | ICD-10-CM | POA: Diagnosis not present

## 2016-12-05 DIAGNOSIS — E1129 Type 2 diabetes mellitus with other diabetic kidney complication: Secondary | ICD-10-CM | POA: Diagnosis not present

## 2016-12-29 NOTE — Progress Notes (Signed)
12/30/2016 11:09 PM   Jonathan Waller March 06, 1941 [id number]  Referring provider: Tommie Sams, DO [address] [place], [place] [address]  Chief Complaint  Patient presents with  . Hematuria  . Elevated PSA    HPI: Patient is a 76 -year-old [place] American male who presents today as a referral from their PCP, Dr. Everlene Other, for gross hematuria and elevated PSA.    Patient was found to have a PSA of 5.47 in 11/2016.    Patient stated that he had blood in his semen for two to three days and then it just quit.  He said he did not see blood in his urine, but that it was present in the urine microscopically.   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  The pathology returned benign.  This occurred in 2002 while he was living in [place].    He does not have a prior history of recurrent urinary tract infections, nephrolithiasis, trauma to the genitourinary tract, BPH or malignancies of the genitourinary tract.   He does not have a family medical history of nephrolithiasis, malignancies of the genitourinary tract or hematuria.   Today, he are having/not having symptoms of frequent urination x 3-4, urgency, nocturia x 2-3, urge incontinence and a weak urinary stream.  He states these symptoms began after his hospitalization in 04/2016 for sepsis due to an UTI.  He is currently on tamsulosin 0.4 mg daily.  His UA today was unremarkable.    He is not experiencing any suprapubic pain, abdominal pain or flank pain. He denies any recent fevers, chills, nausea or vomiting.   RUS completed in 11/2016 noted a right nephrectomy.  Left renal cysts.  He is not a smoker.   He has HTN.   He has a high BMI.    Dr. Patsey Berthold referral notes reviewed.    PMH: Past Medical History:  Diagnosis Date  . BPH (benign prostatic hyperplasia)   . CKD (chronic kidney disease)   . GERD (gastroesophageal reflux disease)   . Gout   .  HLD (hyperlipidemia)   . HTN (hypertension)   . IDDM (insulin dependent diabetes mellitus) (HCC)     Surgical History: Past Surgical History:  Procedure Laterality Date  . BACK SURGERY    . BUNIONECTOMY    . KNEE ARTHROSCOPY    . NEPHRECTOMY      Home Medications:  Allergies as of 12/30/2016      Reactions   Aspirin Nausea And Vomiting   Ulcer with alka seltzer      Medication List       Accurate as of 12/30/16 11:09 PM. Always use your most recent med list.          albuterol (2.5 MG/3ML) 0.083% nebulizer solution Commonly known as:  PROVENTIL Take 3 mLs (2.5 mg total) by nebulization every 6 (six) hours as needed for wheezing or shortness of breath.   amLODipine 5 MG tablet Commonly known as:  NORVASC Take 5 mg by mouth daily.   BD INSULIN SYRINGE ULTRAFINE 31G X 5/16" 0.3 ML Misc Generic drug:  Insulin Syringe-Needle U-100 USE UP TO FIVE TIMES DAILY FOR INSULIN AS DIRECTED   calcium acetate 667 MG capsule Commonly known as:  PHOSLO Take 2 capsules (1,334 mg total) by mouth 3 (three) times daily with meals.   cetirizine 10 MG tablet Commonly known as:  ZYRTEC Take 1 tablet (10 mg  total) by mouth daily.   glucose blood test strip Use as instructed to check your blood sugar daily 90 day supply.E11.8, E11.65, Z79.4   insulin aspart 100 UNIT/ML injection Commonly known as:  novoLOG Inject 8-10 Units into the skin 3 (three) times daily with meals. 8 units before breakfast, 8 units before lunch, 10 units before supper   insulin glargine 100 UNIT/ML injection Commonly known as:  LANTUS Inject 0.24 mLs (24 Units total) into the skin daily.   multivitamin Tabs tablet Take 1 tablet by mouth at bedtime.   nystatin-triamcinolone ointment Commonly known as:  MYCOLOG Apply 1 application topically 2 (two) times daily. Apply to groin area for 2 weeks, twice daily.   pantoprazole 40 MG tablet Commonly known as:  PROTONIX Take 1 tablet (40 mg total) by mouth daily.     sildenafil 20 MG tablet Commonly known as:  REVATIO Take 60-100 mg by mouth daily as needed (ED).   simvastatin 20 MG tablet Commonly known as:  ZOCOR Take 1 tablet (20 mg total) by mouth daily.   tamsulosin 0.4 MG Caps capsule Commonly known as:  FLOMAX Take 1 capsule (0.4 mg total) by mouth daily.       Allergies:  Allergies  Allergen Reactions  . Aspirin Nausea And Vomiting    Ulcer with alka seltzer    Family History: Family History  Problem Relation Age of Onset  . Diabetes Mother   . Hypertension Mother   . Arthritis Mother   . Prostate cancer Neg Hx   . Bladder Cancer Neg Hx   . Kidney cancer Neg Hx     Social History:  reports that he has never smoked. He has never used smokeless tobacco. He reports that he does not drink alcohol. His drug history is not on file.  ROS: UROLOGY Frequent Urination?: Yes Hard to postpone urination?: Yes Burning/pain with urination?: No Get up at night to urinate?: Yes Leakage of urine?: Yes Urine stream starts and stops?: No Trouble starting stream?: No Do you have to strain to urinate?: No Blood in urine?: Yes Urinary tract infection?: No Sexually transmitted disease?: No Injury to kidneys or bladder?: No Painful intercourse?: No Weak stream?: Yes Erection problems?: Yes Penile pain?: No  Gastrointestinal Nausea?: No Vomiting?: No Indigestion/heartburn?: No Diarrhea?: No Constipation?: No  Constitutional Fever: No Night sweats?: No Weight loss?: No Fatigue?: No  Skin Skin rash/lesions?: No Itching?: Yes  Eyes Blurred vision?: Yes Double vision?: No  Ears/Nose/Throat Sore throat?: No Sinus problems?: Yes  Hematologic/Lymphatic Swollen glands?: No Easy bruising?: No  Cardiovascular Leg swelling?: Yes Chest pain?: No  Respiratory Cough?: Yes Shortness of breath?: No  Endocrine Excessive thirst?: No  Musculoskeletal Back pain?: Yes Joint pain?: Yes  Neurological Headaches?:  No Dizziness?: No  Psychologic Depression?: No Anxiety?: No  Physical Exam: BP 110/69   Pulse 93   Ht 6\' 2"  (1.88 m)   Wt 254 lb (115.2 kg)   BMI 32.61 kg/m   Constitutional: Well nourished. Alert and oriented, No acute distress. HEENT: [place] AT, moist mucus membranes. Trachea midline, no masses. Cardiovascular: No clubbing, cyanosis, or edema. Respiratory: Normal respiratory effort, no increased work of breathing. GI: Abdomen is soft, non tender, non distended, no abdominal masses. Liver and spleen not palpable.  No hernias appreciated.  Stool sample for occult testing is not indicated.   GU: No CVA tenderness.  No bladder fullness or masses.  Patient with circumcised phallus.  Urethral meatus is patent.  No penile discharge.  No penile lesions or rashes. Scrotum without lesions, cysts, rashes and/or edema.  Testicles are located scrotally bilaterally. No masses are appreciated in the testicles. Left and right epididymis are normal. Rectal: Patient with  normal sphincter tone. Anus and perineum without scarring or rashes. No rectal masses are appreciated. Prostate is approximately 55 grams, no nodules are appreciated. Seminal vesicles are normal. Skin: No rashes, bruises or suspicious lesions. Lymph: No cervical or inguinal adenopathy. Neurologic: Grossly intact, no focal deficits, moving all 4 extremities. Psychiatric: Normal mood and affect.  Laboratory Data: Lab Results  Component Value Date   WBC 2.9 (L) 10/24/2016   HGB 13.0 10/24/2016   HCT 39.4 10/24/2016   MCV 89.0 10/24/2016   PLT 177.0 10/24/2016    Lab Results  Component Value Date   CREATININE 2.43 (H) 12/02/2016    Lab Results  Component Value Date   PSA 5.47 (H) 12/02/2016    Lab Results  Component Value Date   HGBA1C 9.0 (H) 10/24/2016       Component Value Date/Time   CHOL 114 06/09/2016 1012   HDL 38.70 (L) 06/09/2016 1012   CHOLHDL 3 06/09/2016 1012   VLDL 34.2 06/09/2016 1012   LDLCALC 42  06/09/2016 1012    Lab Results  Component Value Date   AST 16 10/24/2016   Lab Results  Component Value Date   ALT 19 10/24/2016     Urinalysis Results for Jonathan Waller, Jonathan Waller (MRN [id number]) as of 12/30/2016 11:21  Ref. Range 12/30/2016 10:58  Scan Result Unknown 34    I have reviewed the labs.     Pertinent Imaging: CLINICAL DATA:  Chronic renal failure  EXAM: RENAL / URINARY TRACT ULTRASOUND COMPLETE  COMPARISON:  None.  FINDINGS: Right Kidney:  The patient is status post right nephrectomy.  Left Kidney:  Length: 13.2 cm. Multiple renal cysts with the largest measuring 4.9 cm.  Bladder:  Appears normal for degree of bladder distention.  IMPRESSION: Right nephrectomy.  Left renal cysts.   Electronically Signed   By: Gerome Sam III M.D   On: 12/05/2016 14:15  I have independently reviewed the films.    Assessment & Plan:    1. Hematospermia  - Patient currently denies any episodes of gross hematuria - he states he has only seen it in his semen  - UA - negative for hematuria at today's visit  - Urine culture  - BUN + creatinine    - Patient will return in 2 weeks for UA - will report any gross hematuria  2. Solitary kidney  - right kidney removed - pathology per patient was benign  - appointment with nephrology pending   3. Left renal cysts  - these are benign   4. Elevated PSA  - age specific PSA for ages 66 to 23 is 0-6.5  - no previous PSA's available so unable to evaluate PSA trend  - if second UA is negative for hematuria in 2 weeks will recheck PSA  5. BPH with LU TS  - worsening urinary symptoms - may be due to worsening renal function and uncontrolled DM  - once DM is under better control will reassess  Return in about 2 weeks (around 01/13/2017) for UA only.  These notes generated with voice recognition software. I apologize for typographical errors.  Michiel Cowboy, PA-C  [facility] [address], [address] [place], [place] [address] [phone]

## 2016-12-30 ENCOUNTER — Encounter: Payer: Self-pay | Admitting: Urology

## 2016-12-30 ENCOUNTER — Ambulatory Visit (INDEPENDENT_AMBULATORY_CARE_PROVIDER_SITE_OTHER): Payer: Medicare Other | Admitting: Urology

## 2016-12-30 VITALS — BP 110/69 | HR 93 | Ht 74.0 in | Wt 254.0 lb

## 2016-12-30 DIAGNOSIS — R361 Hematospermia: Secondary | ICD-10-CM | POA: Diagnosis not present

## 2016-12-30 DIAGNOSIS — Q600 Renal agenesis, unilateral: Secondary | ICD-10-CM | POA: Diagnosis not present

## 2016-12-30 DIAGNOSIS — R972 Elevated prostate specific antigen [PSA]: Secondary | ICD-10-CM | POA: Diagnosis not present

## 2016-12-30 DIAGNOSIS — R310 Gross hematuria: Secondary | ICD-10-CM | POA: Diagnosis not present

## 2016-12-30 DIAGNOSIS — N281 Cyst of kidney, acquired: Secondary | ICD-10-CM | POA: Diagnosis not present

## 2016-12-30 DIAGNOSIS — N401 Enlarged prostate with lower urinary tract symptoms: Secondary | ICD-10-CM

## 2016-12-30 DIAGNOSIS — IMO0002 Reserved for concepts with insufficient information to code with codable children: Secondary | ICD-10-CM

## 2016-12-30 DIAGNOSIS — N138 Other obstructive and reflux uropathy: Secondary | ICD-10-CM

## 2016-12-30 LAB — URINALYSIS, COMPLETE
Bilirubin, UA: NEGATIVE
Glucose, UA: NEGATIVE
KETONES UA: NEGATIVE
NITRITE UA: NEGATIVE
SPEC GRAV UA: 1.025 (ref 1.005–1.030)
Urobilinogen, Ur: 0.2 mg/dL (ref 0.2–1.0)
pH, UA: 6.5 (ref 5.0–7.5)

## 2016-12-30 LAB — MICROSCOPIC EXAMINATION

## 2016-12-30 LAB — BLADDER SCAN AMB NON-IMAGING: SCAN RESULT: 34

## 2016-12-31 LAB — BUN+CREAT
BUN / CREAT RATIO: 15 (ref 10–24)
BUN: 34 mg/dL — ABNORMAL HIGH (ref 8–27)
Creatinine, Ser: 2.2 mg/dL — ABNORMAL HIGH (ref 0.76–1.27)
GFR calc non Af Amer: 28 mL/min/{1.73_m2} — ABNORMAL LOW (ref >59)
GFR, EST AFRICAN AMERICAN: 33 mL/min/{1.73_m2} — AB (ref >59)

## 2017-01-01 ENCOUNTER — Telehealth: Payer: Self-pay

## 2017-01-01 NOTE — Telephone Encounter (Signed)
Patient notified

## 2017-01-01 NOTE — Telephone Encounter (Signed)
-----   Message from Harle Battiest, PA-C sent at 12/31/2016  9:52 AM EDT ----- Please let Jonathan Waller that it is very important that he follow up with nephrology as Dr. Adriana Simas requested.   His kidney function is down.  He also should be avoiding nephrotoxic agents such as NSAIDS.  He should avoid Advil, Motrin, Naprosyn, etc.  He should also speak with the pharmacist before taking any OTC medications.

## 2017-01-02 LAB — CULTURE, URINE COMPREHENSIVE

## 2017-01-12 ENCOUNTER — Encounter: Payer: Self-pay | Admitting: Urology

## 2017-01-12 ENCOUNTER — Other Ambulatory Visit: Payer: Medicare Other

## 2017-01-20 DIAGNOSIS — R05 Cough: Secondary | ICD-10-CM | POA: Diagnosis not present

## 2017-01-20 DIAGNOSIS — N400 Enlarged prostate without lower urinary tract symptoms: Secondary | ICD-10-CM | POA: Diagnosis not present

## 2017-01-20 DIAGNOSIS — I10 Essential (primary) hypertension: Secondary | ICD-10-CM | POA: Diagnosis not present

## 2017-01-20 DIAGNOSIS — J208 Acute bronchitis due to other specified organisms: Secondary | ICD-10-CM | POA: Diagnosis not present

## 2017-01-20 DIAGNOSIS — J060 Acute laryngopharyngitis: Secondary | ICD-10-CM | POA: Diagnosis not present

## 2017-01-20 DIAGNOSIS — L84 Corns and callosities: Secondary | ICD-10-CM | POA: Diagnosis not present

## 2017-01-20 DIAGNOSIS — E1165 Type 2 diabetes mellitus with hyperglycemia: Secondary | ICD-10-CM | POA: Diagnosis not present

## 2017-01-20 DIAGNOSIS — N183 Chronic kidney disease, stage 3 (moderate): Secondary | ICD-10-CM | POA: Diagnosis not present

## 2017-01-20 DIAGNOSIS — E782 Mixed hyperlipidemia: Secondary | ICD-10-CM | POA: Diagnosis not present

## 2017-01-20 DIAGNOSIS — E1142 Type 2 diabetes mellitus with diabetic polyneuropathy: Secondary | ICD-10-CM | POA: Diagnosis not present

## 2017-01-20 DIAGNOSIS — Z0000 Encounter for general adult medical examination without abnormal findings: Secondary | ICD-10-CM | POA: Diagnosis not present

## 2017-01-20 DIAGNOSIS — E1122 Type 2 diabetes mellitus with diabetic chronic kidney disease: Secondary | ICD-10-CM | POA: Diagnosis not present

## 2017-02-02 ENCOUNTER — Encounter: Payer: Self-pay | Admitting: Family Medicine

## 2017-02-05 DIAGNOSIS — I10 Essential (primary) hypertension: Secondary | ICD-10-CM | POA: Diagnosis not present

## 2017-02-05 DIAGNOSIS — E1122 Type 2 diabetes mellitus with diabetic chronic kidney disease: Secondary | ICD-10-CM | POA: Diagnosis not present

## 2017-02-05 DIAGNOSIS — L84 Corns and callosities: Secondary | ICD-10-CM | POA: Diagnosis not present

## 2017-02-05 DIAGNOSIS — J208 Acute bronchitis due to other specified organisms: Secondary | ICD-10-CM | POA: Diagnosis not present

## 2017-02-05 DIAGNOSIS — E782 Mixed hyperlipidemia: Secondary | ICD-10-CM | POA: Diagnosis not present

## 2017-02-05 DIAGNOSIS — N400 Enlarged prostate without lower urinary tract symptoms: Secondary | ICD-10-CM | POA: Diagnosis not present

## 2017-02-05 DIAGNOSIS — N183 Chronic kidney disease, stage 3 (moderate): Secondary | ICD-10-CM | POA: Diagnosis not present

## 2017-02-05 DIAGNOSIS — R05 Cough: Secondary | ICD-10-CM | POA: Diagnosis not present

## 2017-02-05 DIAGNOSIS — J060 Acute laryngopharyngitis: Secondary | ICD-10-CM | POA: Diagnosis not present

## 2017-02-05 DIAGNOSIS — E1165 Type 2 diabetes mellitus with hyperglycemia: Secondary | ICD-10-CM | POA: Diagnosis not present

## 2017-02-05 DIAGNOSIS — E1142 Type 2 diabetes mellitus with diabetic polyneuropathy: Secondary | ICD-10-CM | POA: Diagnosis not present

## 2017-02-05 DIAGNOSIS — Z0000 Encounter for general adult medical examination without abnormal findings: Secondary | ICD-10-CM | POA: Diagnosis not present

## 2017-02-17 DIAGNOSIS — M216X1 Other acquired deformities of right foot: Secondary | ICD-10-CM | POA: Diagnosis not present

## 2017-02-17 DIAGNOSIS — M25572 Pain in left ankle and joints of left foot: Secondary | ICD-10-CM | POA: Diagnosis not present

## 2017-02-17 DIAGNOSIS — M216X2 Other acquired deformities of left foot: Secondary | ICD-10-CM | POA: Diagnosis not present

## 2017-02-17 DIAGNOSIS — M25571 Pain in right ankle and joints of right foot: Secondary | ICD-10-CM | POA: Diagnosis not present

## 2017-03-15 IMAGING — US US RENAL
1 series · 14 of 25 positions shown · non-contrast
Comparison: None.

CLINICAL DATA: Status post right nephrectomy.  Acute kidney injury.

EXAM:
RENAL / URINARY TRACT ULTRASOUND COMPLETE

[Series 1: us renal · 0.27mm/px · 14 of 38 slices shown]
[im 1/38]
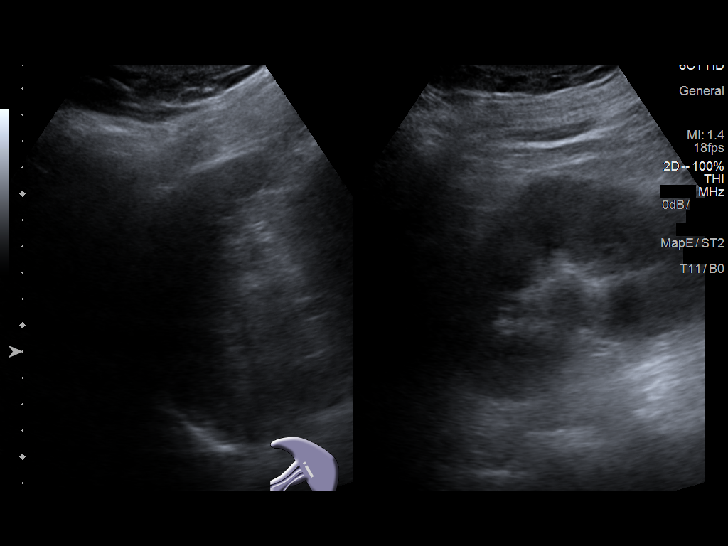
[im 4/38]
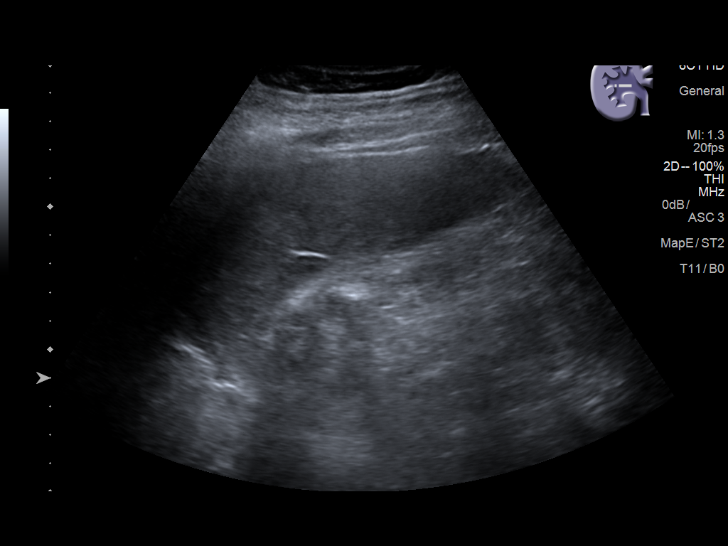
[im 7/38]
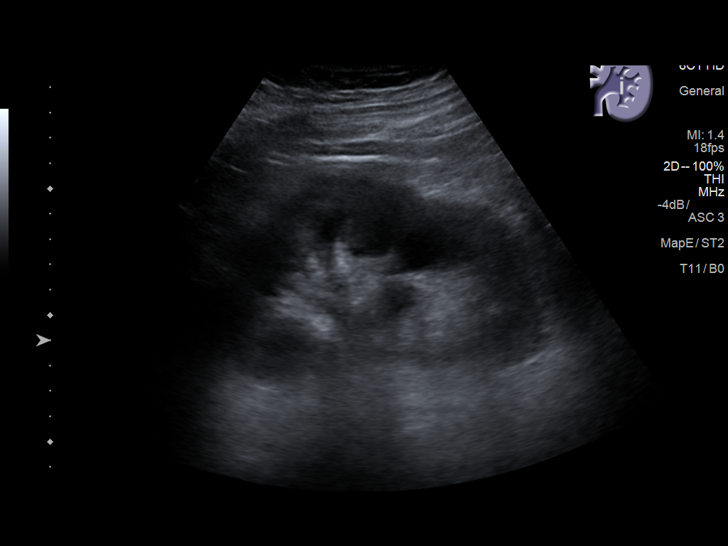
[im 10/38]
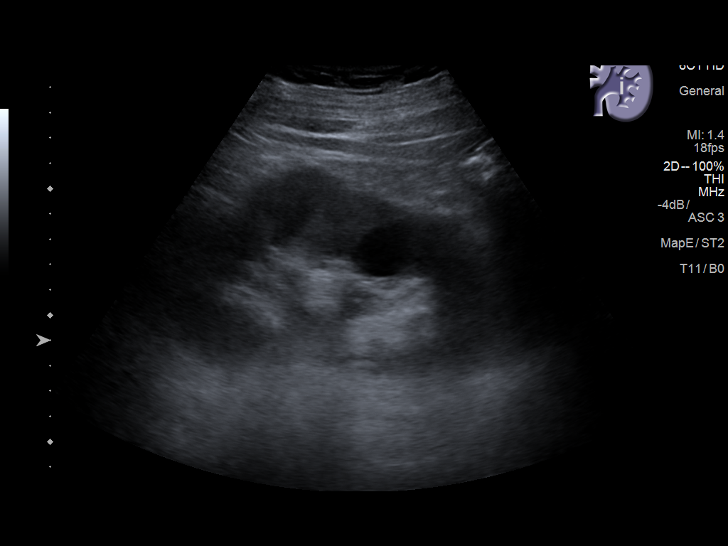
[im 13/38]
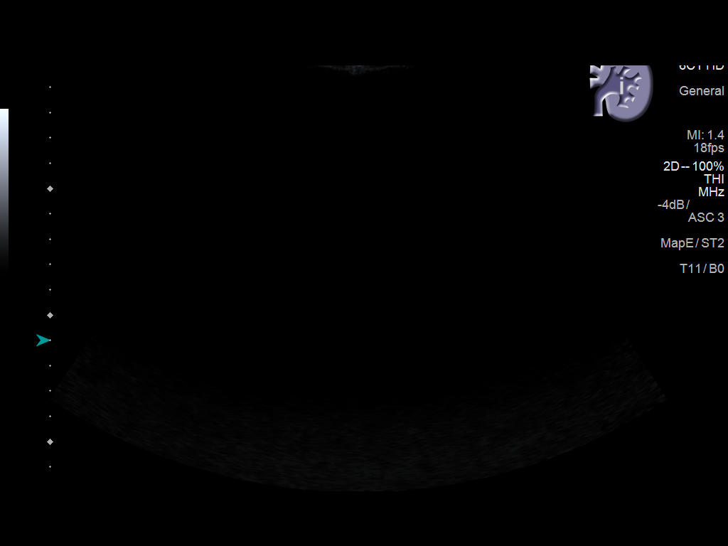
[im 14/38]
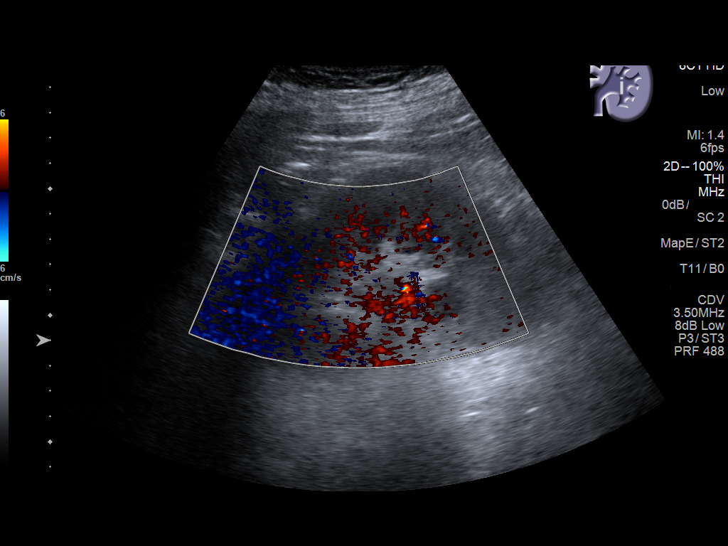
[im 17/38]
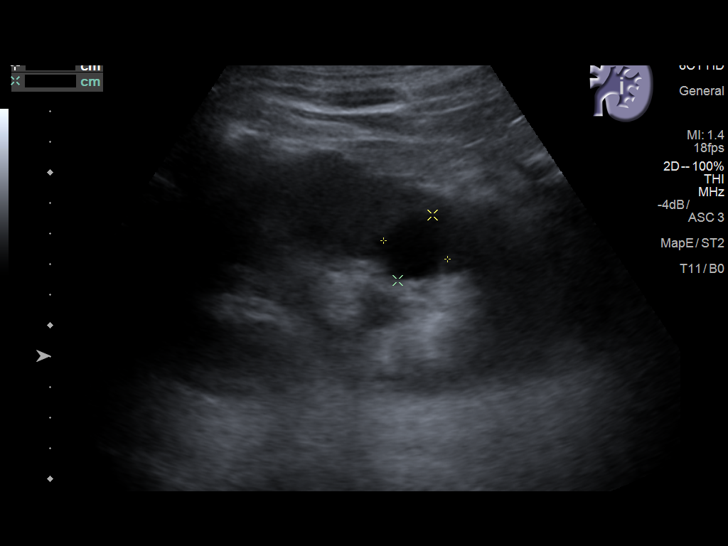
[im 21/38]
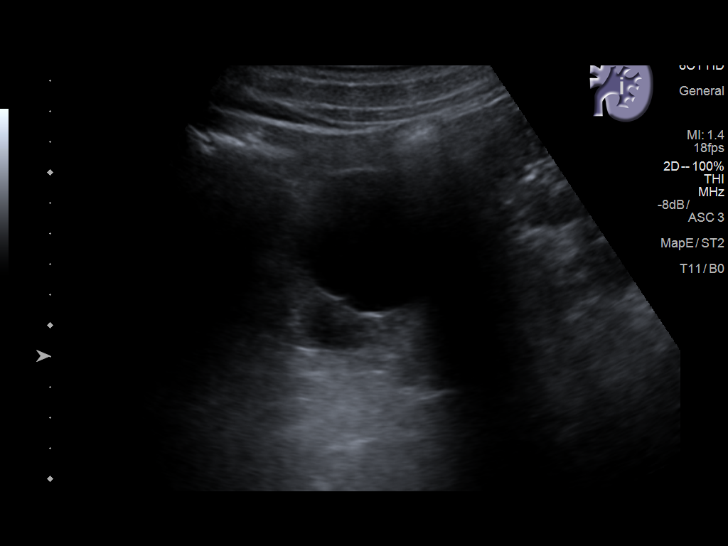
[im 24/38]
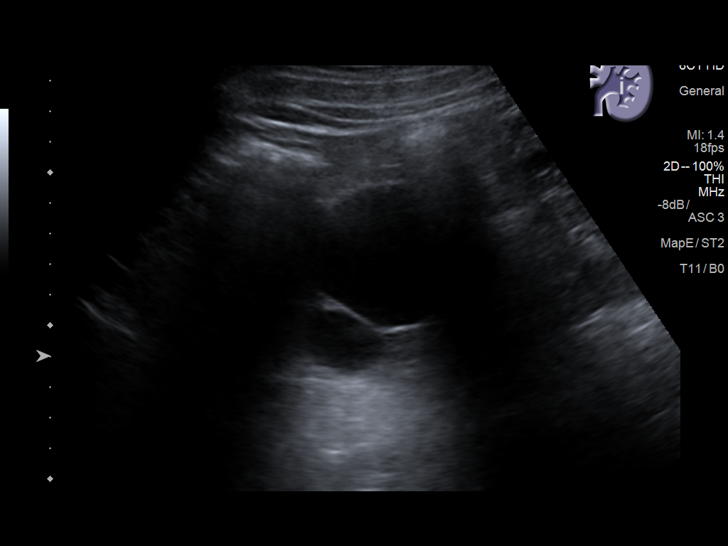
[im 25/38]
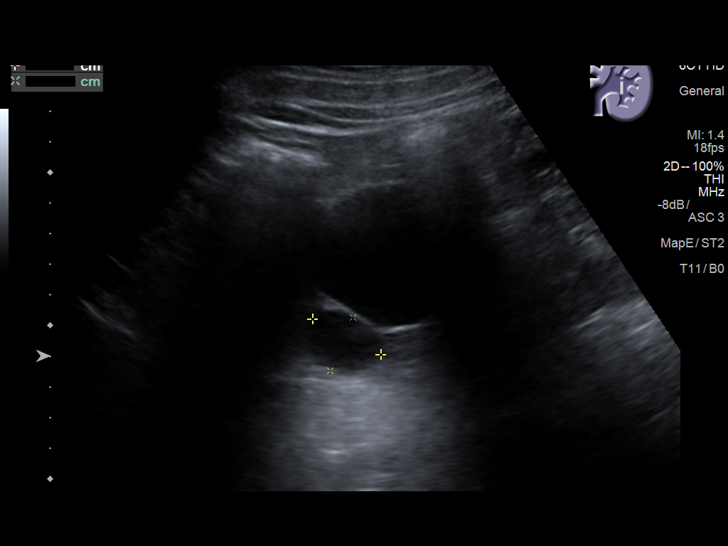
[im 28/38]
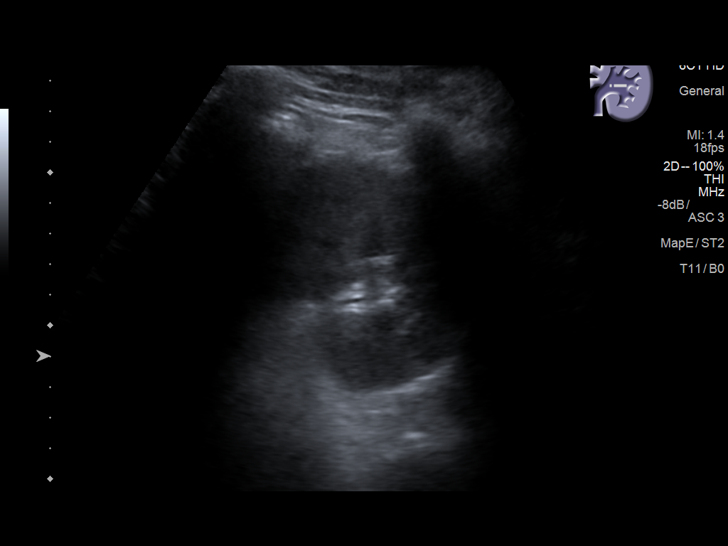
[im 31/38]
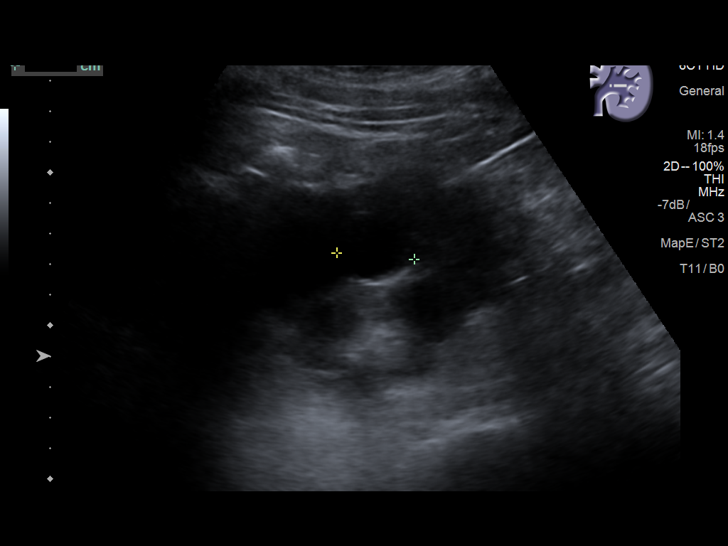
[im 34/38]
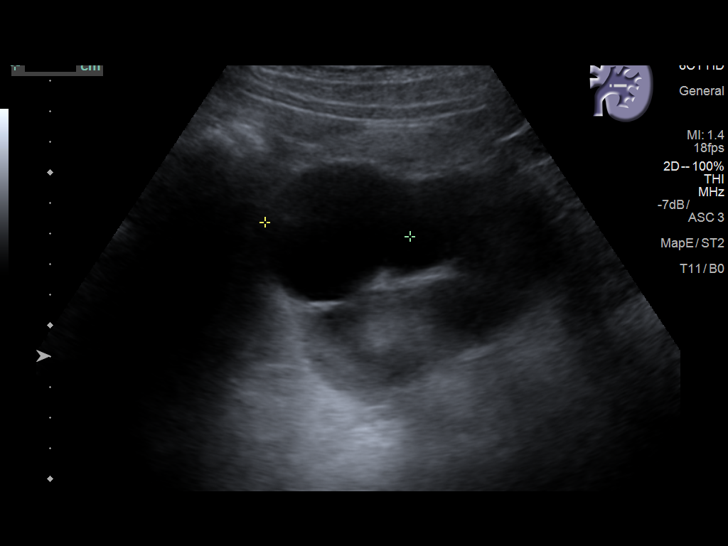
[im 38/38]
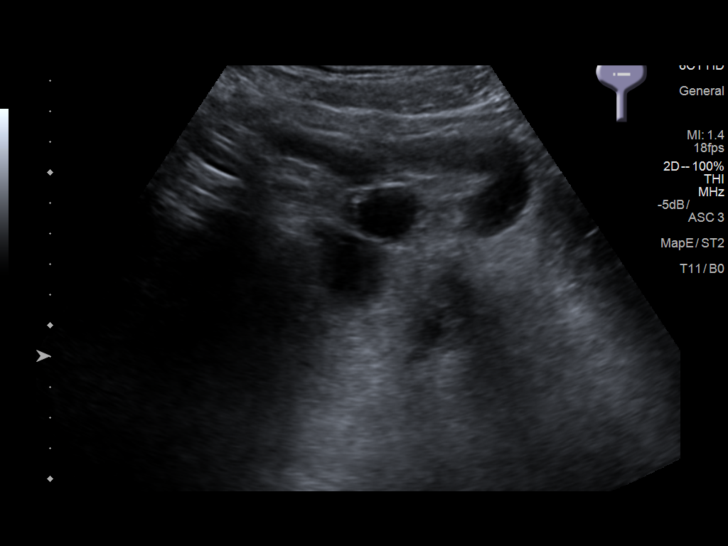

[14 of 25 positions shown; findings below may reference images not displayed]

FINDINGS: Right Kidney:

The right kidney is surgically absent. The right renal fossa was
scanned and unremarkable.

Left Kidney:

Length: 13.2 cm. There are multiple left renal cysts, the largest of
which measures 4.8 x 4.8 x 4.8 cm, located near the lower pole.

Bladder:

The bladder was nondistended.
IMPRESSION: 1. Status post right nephrectomy.  Unremarkable right renal fossa.
2. Multiple left renal cysts, but otherwise normal sonographic
appearance of the left kidney.

## 2017-03-15 IMAGING — US US ABDOMEN LIMITED
1 series · 14 of 25 positions shown · non-contrast
Comparison: None.

CLINICAL DATA: Fever with right abdominal pain

EXAM:
US ABDOMEN LIMITED - RIGHT UPPER QUADRANT

[Series 1: us abdomen limited · 0.28mm/px · 14 of 40 slices shown]
[im 1/40]
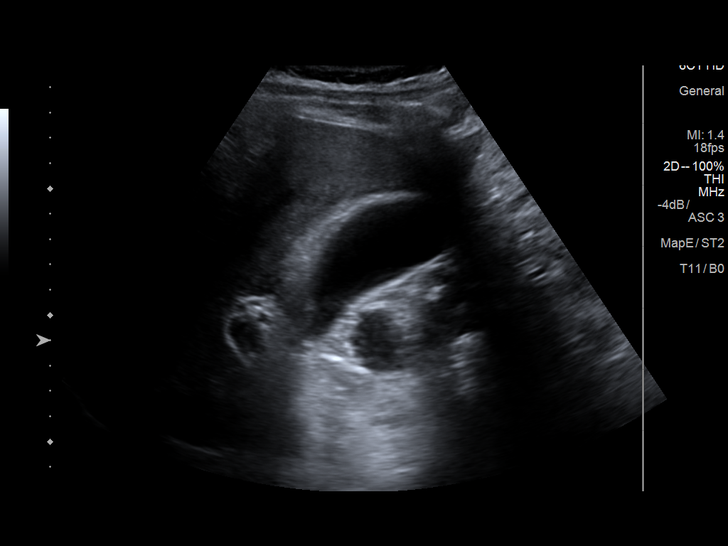
[im 4/40]
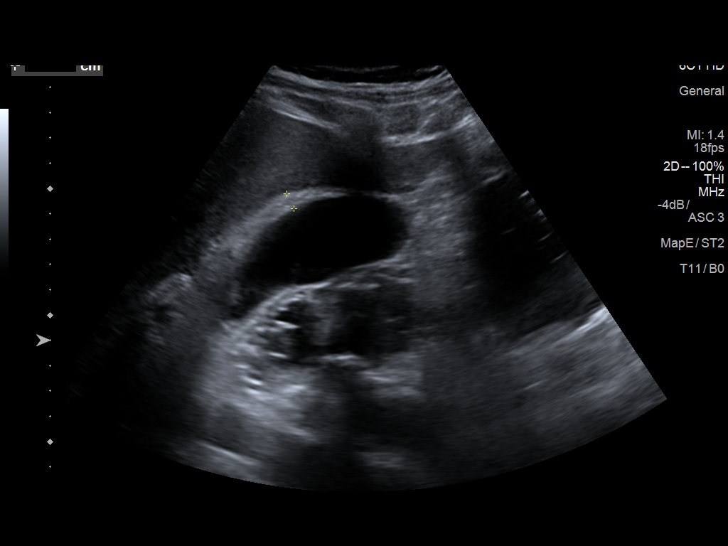
[im 7/40]
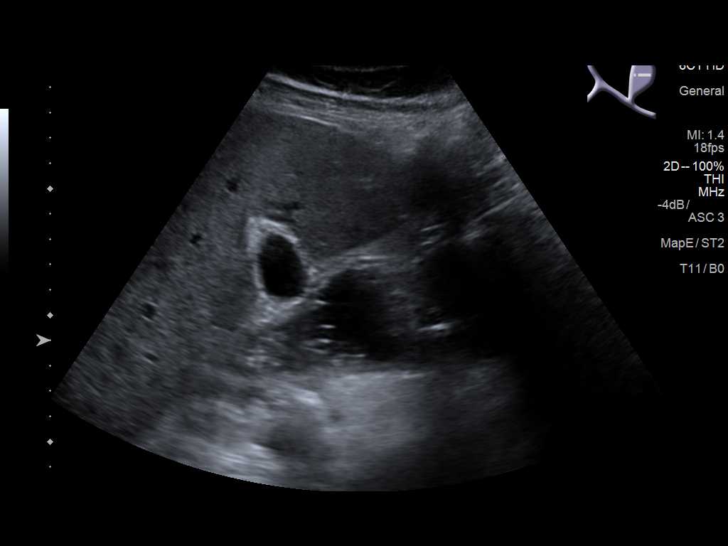
[im 10/40]
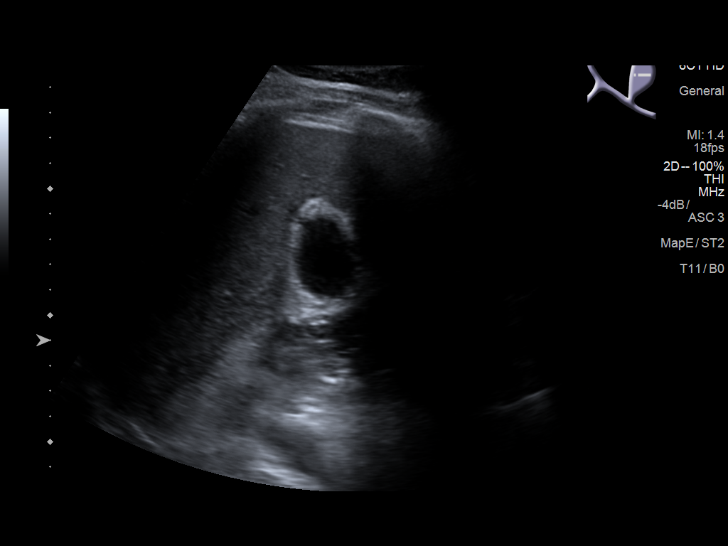
[im 14/40]
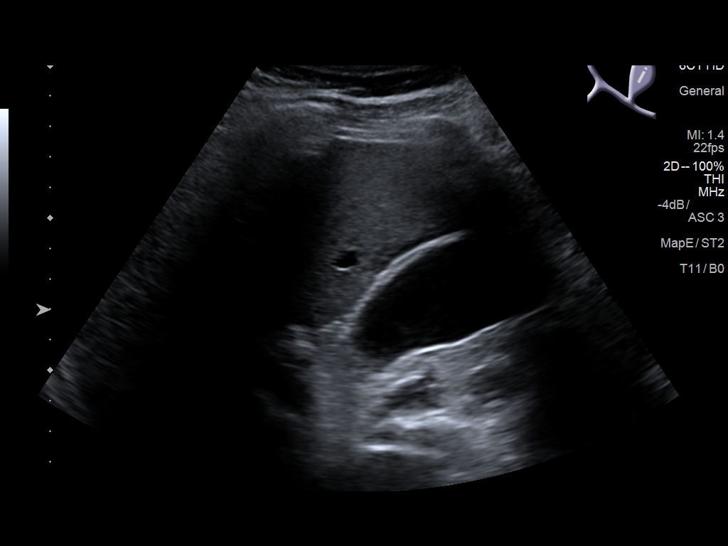
[im 15/40]
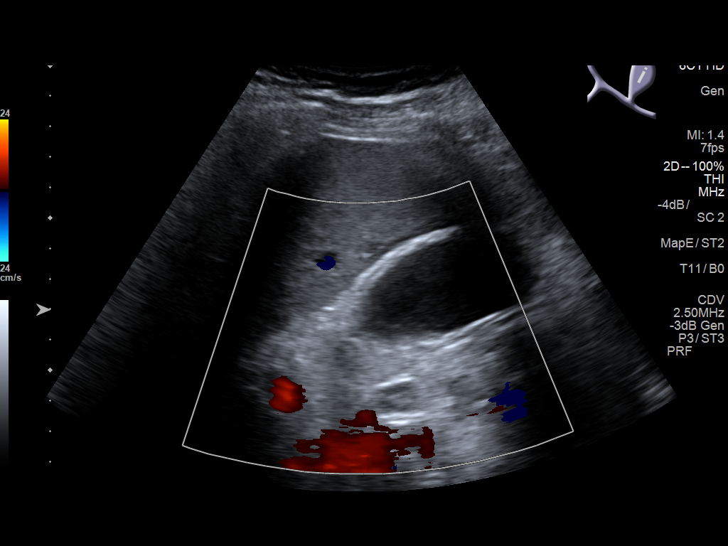
[im 18/40]
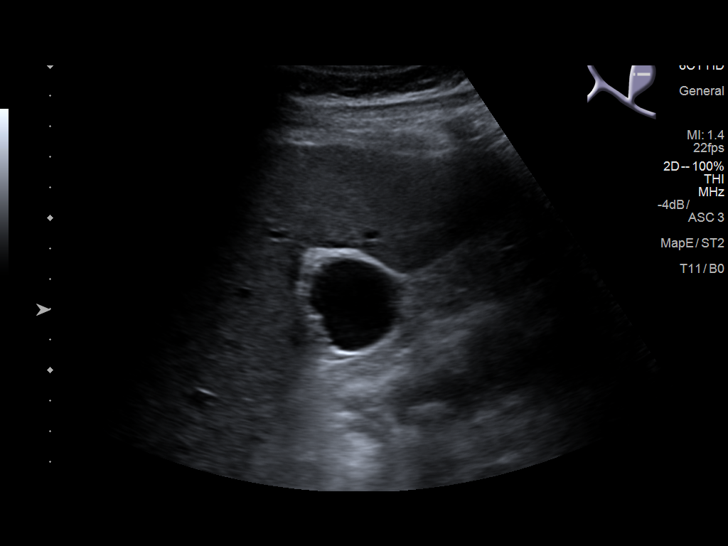
[im 22/40]
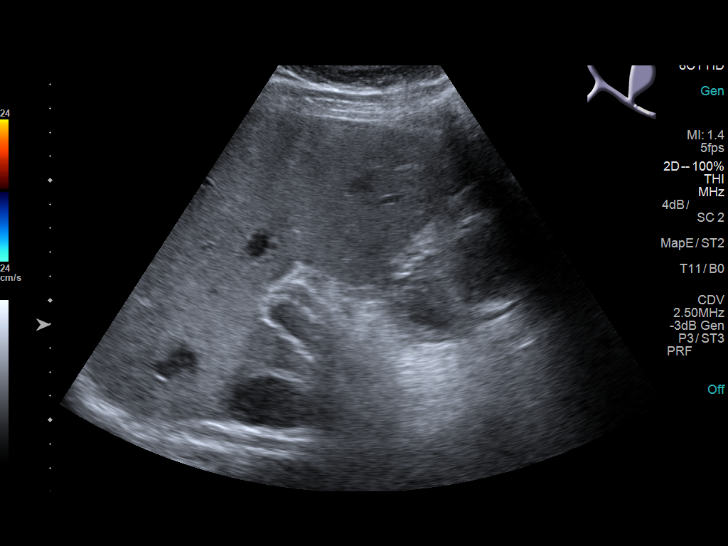
[im 25/40]
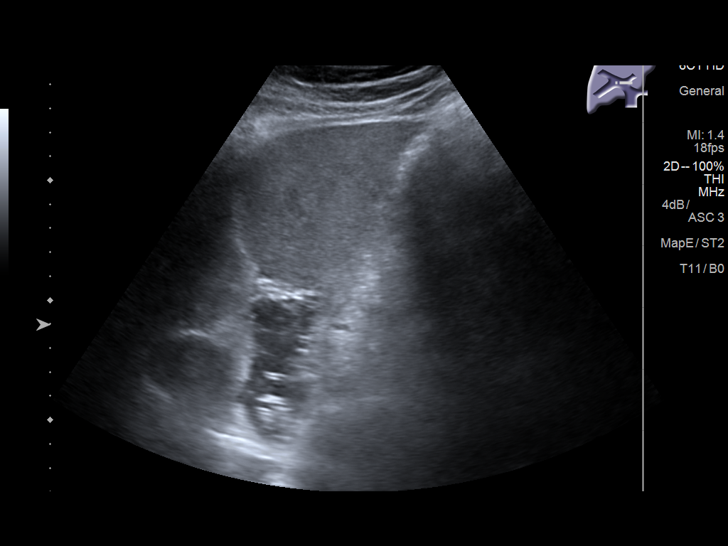
[im 27/40]
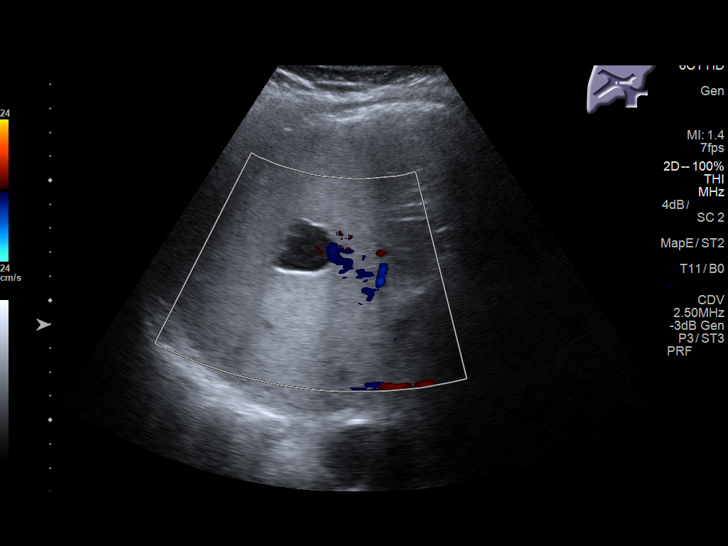
[im 30/40]
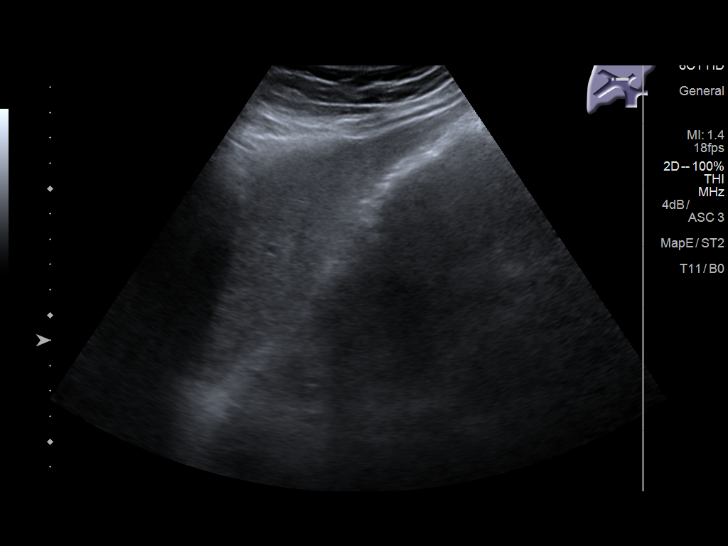
[im 33/40]
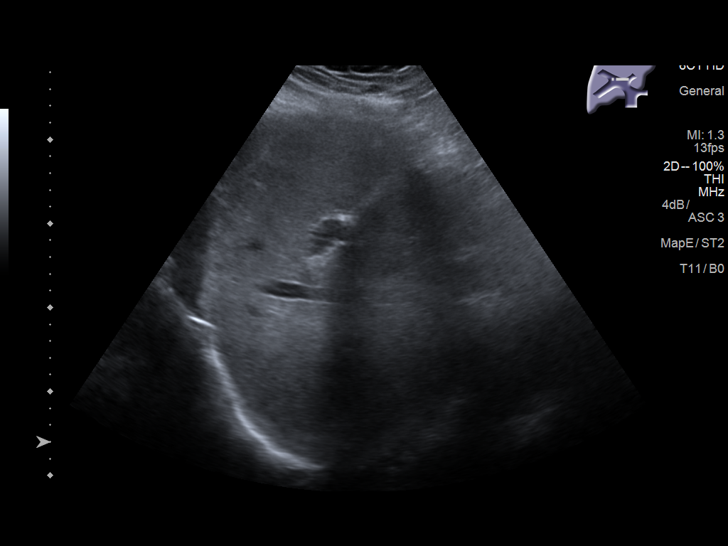
[im 36/40]
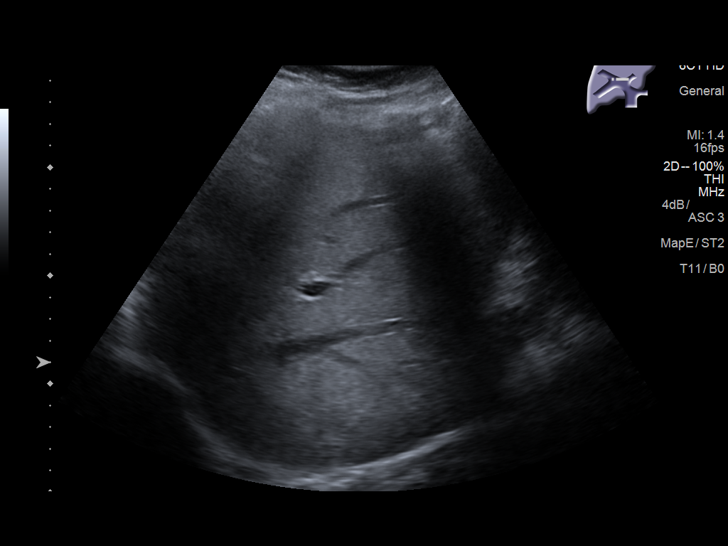
[im 40/40]
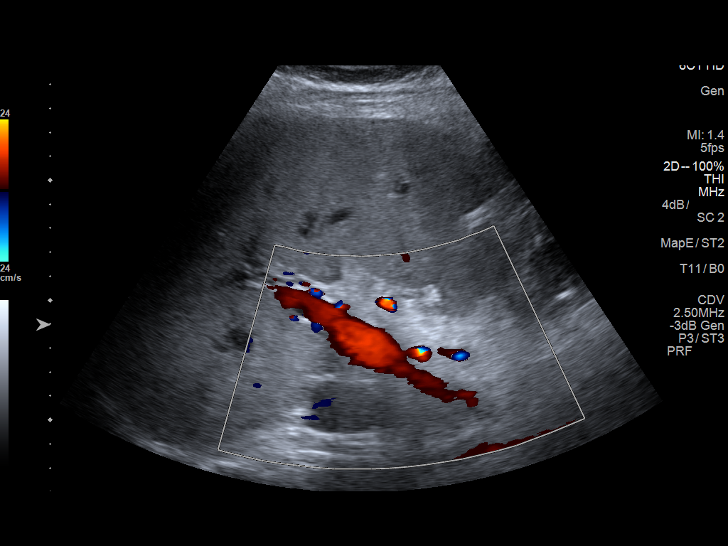

[14 of 25 positions shown; findings below may reference images not displayed]

FINDINGS: Gallbladder:

No gallstones are evident. The gallbladder wall appears thickened
and edematous. Minimal pericholecystic fluid noted. No sonographic
Murphy sign noted by sonographer; note, however, the patient was
given pain medication prior to this study which could mask focal
tenderness to interrogation.

Common bile duct:

Diameter: 6 mm. No intrahepatic or extrahepatic biliary duct
dilatation.

Liver:

In the anterior segment of the right lobe of the liver, there is a
2.8 x 2.2 x 2.8 cm cystic area containing minimal septations. No
other focal liver lesion evident. Within normal limits in
parenchymal echogenicity.
IMPRESSION: The appearance of the gallbladder is concerning for acalculus
cholecystitis.

Cystic area containing minimal septations in the anterior segment
right lobe of the liver. This area appears benign.

No biliary duct dilatation evident.

## 2017-04-22 DIAGNOSIS — I10 Essential (primary) hypertension: Secondary | ICD-10-CM | POA: Diagnosis not present

## 2017-04-22 DIAGNOSIS — I429 Cardiomyopathy, unspecified: Secondary | ICD-10-CM | POA: Diagnosis not present

## 2017-04-22 DIAGNOSIS — E1142 Type 2 diabetes mellitus with diabetic polyneuropathy: Secondary | ICD-10-CM | POA: Diagnosis not present

## 2017-04-22 DIAGNOSIS — N189 Chronic kidney disease, unspecified: Secondary | ICD-10-CM | POA: Diagnosis not present

## 2017-04-22 DIAGNOSIS — R05 Cough: Secondary | ICD-10-CM | POA: Diagnosis not present

## 2017-04-22 DIAGNOSIS — J208 Acute bronchitis due to other specified organisms: Secondary | ICD-10-CM | POA: Diagnosis not present

## 2017-04-22 DIAGNOSIS — E782 Mixed hyperlipidemia: Secondary | ICD-10-CM | POA: Diagnosis not present

## 2017-04-22 DIAGNOSIS — E1122 Type 2 diabetes mellitus with diabetic chronic kidney disease: Secondary | ICD-10-CM | POA: Diagnosis not present

## 2017-04-22 DIAGNOSIS — T7849XS Other allergy, sequela: Secondary | ICD-10-CM | POA: Diagnosis not present

## 2017-04-22 DIAGNOSIS — Z0000 Encounter for general adult medical examination without abnormal findings: Secondary | ICD-10-CM | POA: Diagnosis not present

## 2017-04-22 DIAGNOSIS — E1165 Type 2 diabetes mellitus with hyperglycemia: Secondary | ICD-10-CM | POA: Diagnosis not present

## 2017-04-22 DIAGNOSIS — J060 Acute laryngopharyngitis: Secondary | ICD-10-CM | POA: Diagnosis not present

## 2017-04-22 DIAGNOSIS — N183 Chronic kidney disease, stage 3 (moderate): Secondary | ICD-10-CM | POA: Diagnosis not present

## 2017-04-22 DIAGNOSIS — L84 Corns and callosities: Secondary | ICD-10-CM | POA: Diagnosis not present

## 2017-04-22 DIAGNOSIS — Z125 Encounter for screening for malignant neoplasm of prostate: Secondary | ICD-10-CM | POA: Diagnosis not present

## 2017-04-27 ENCOUNTER — Ambulatory Visit: Payer: Medicare Other | Admitting: Family Medicine

## 2017-05-07 DIAGNOSIS — E291 Testicular hypofunction: Secondary | ICD-10-CM | POA: Diagnosis not present

## 2017-05-07 DIAGNOSIS — N5201 Erectile dysfunction due to arterial insufficiency: Secondary | ICD-10-CM | POA: Diagnosis not present

## 2017-05-07 DIAGNOSIS — R972 Elevated prostate specific antigen [PSA]: Secondary | ICD-10-CM | POA: Diagnosis not present

## 2017-05-07 DIAGNOSIS — N401 Enlarged prostate with lower urinary tract symptoms: Secondary | ICD-10-CM | POA: Diagnosis not present

## 2017-06-18 DIAGNOSIS — N5201 Erectile dysfunction due to arterial insufficiency: Secondary | ICD-10-CM | POA: Diagnosis not present

## 2017-06-18 DIAGNOSIS — R972 Elevated prostate specific antigen [PSA]: Secondary | ICD-10-CM | POA: Diagnosis not present

## 2017-06-18 DIAGNOSIS — N401 Enlarged prostate with lower urinary tract symptoms: Secondary | ICD-10-CM | POA: Diagnosis not present

## 2017-06-18 DIAGNOSIS — E291 Testicular hypofunction: Secondary | ICD-10-CM | POA: Diagnosis not present

## 2017-07-21 ENCOUNTER — Ambulatory Visit: Payer: Medicare Other

## 2017-07-23 DIAGNOSIS — E1165 Type 2 diabetes mellitus with hyperglycemia: Secondary | ICD-10-CM | POA: Diagnosis not present

## 2017-07-23 DIAGNOSIS — R05 Cough: Secondary | ICD-10-CM | POA: Diagnosis not present

## 2017-07-23 DIAGNOSIS — J060 Acute laryngopharyngitis: Secondary | ICD-10-CM | POA: Diagnosis not present

## 2017-07-23 DIAGNOSIS — N189 Chronic kidney disease, unspecified: Secondary | ICD-10-CM | POA: Diagnosis not present

## 2017-07-23 DIAGNOSIS — E782 Mixed hyperlipidemia: Secondary | ICD-10-CM | POA: Diagnosis not present

## 2017-07-23 DIAGNOSIS — I429 Cardiomyopathy, unspecified: Secondary | ICD-10-CM | POA: Diagnosis not present

## 2017-07-23 DIAGNOSIS — E1142 Type 2 diabetes mellitus with diabetic polyneuropathy: Secondary | ICD-10-CM | POA: Diagnosis not present

## 2017-07-23 DIAGNOSIS — T7849XS Other allergy, sequela: Secondary | ICD-10-CM | POA: Diagnosis not present

## 2017-07-23 DIAGNOSIS — L84 Corns and callosities: Secondary | ICD-10-CM | POA: Diagnosis not present

## 2017-07-23 DIAGNOSIS — E1122 Type 2 diabetes mellitus with diabetic chronic kidney disease: Secondary | ICD-10-CM | POA: Diagnosis not present

## 2017-07-23 DIAGNOSIS — N183 Chronic kidney disease, stage 3 (moderate): Secondary | ICD-10-CM | POA: Diagnosis not present

## 2017-07-23 DIAGNOSIS — Z905 Acquired absence of kidney: Secondary | ICD-10-CM | POA: Diagnosis not present

## 2017-07-23 DIAGNOSIS — J208 Acute bronchitis due to other specified organisms: Secondary | ICD-10-CM | POA: Diagnosis not present

## 2017-07-23 DIAGNOSIS — J309 Allergic rhinitis, unspecified: Secondary | ICD-10-CM | POA: Diagnosis not present

## 2017-07-23 DIAGNOSIS — N400 Enlarged prostate without lower urinary tract symptoms: Secondary | ICD-10-CM | POA: Diagnosis not present

## 2017-07-23 DIAGNOSIS — Z0000 Encounter for general adult medical examination without abnormal findings: Secondary | ICD-10-CM | POA: Diagnosis not present

## 2017-07-23 DIAGNOSIS — I10 Essential (primary) hypertension: Secondary | ICD-10-CM | POA: Diagnosis not present

## 2017-07-23 DIAGNOSIS — Z6841 Body Mass Index (BMI) 40.0 and over, adult: Secondary | ICD-10-CM | POA: Diagnosis not present

## 2017-07-29 DIAGNOSIS — Z1212 Encounter for screening for malignant neoplasm of rectum: Secondary | ICD-10-CM | POA: Diagnosis not present

## 2017-07-29 DIAGNOSIS — Z1211 Encounter for screening for malignant neoplasm of colon: Secondary | ICD-10-CM | POA: Diagnosis not present

## 2017-08-10 DIAGNOSIS — E291 Testicular hypofunction: Secondary | ICD-10-CM | POA: Diagnosis not present

## 2017-08-10 DIAGNOSIS — R972 Elevated prostate specific antigen [PSA]: Secondary | ICD-10-CM | POA: Diagnosis not present

## 2017-08-10 DIAGNOSIS — N401 Enlarged prostate with lower urinary tract symptoms: Secondary | ICD-10-CM | POA: Diagnosis not present

## 2017-08-10 DIAGNOSIS — N5201 Erectile dysfunction due to arterial insufficiency: Secondary | ICD-10-CM | POA: Diagnosis not present

## 2017-09-23 DIAGNOSIS — N183 Chronic kidney disease, stage 3 (moderate): Secondary | ICD-10-CM | POA: Diagnosis not present

## 2017-09-29 DIAGNOSIS — N183 Chronic kidney disease, stage 3 (moderate): Secondary | ICD-10-CM | POA: Diagnosis not present

## 2017-09-29 DIAGNOSIS — Z905 Acquired absence of kidney: Secondary | ICD-10-CM | POA: Diagnosis not present

## 2017-09-29 DIAGNOSIS — E1122 Type 2 diabetes mellitus with diabetic chronic kidney disease: Secondary | ICD-10-CM | POA: Diagnosis not present

## 2017-09-29 DIAGNOSIS — I129 Hypertensive chronic kidney disease with stage 1 through stage 4 chronic kidney disease, or unspecified chronic kidney disease: Secondary | ICD-10-CM | POA: Diagnosis not present

## 2017-09-30 DIAGNOSIS — E1142 Type 2 diabetes mellitus with diabetic polyneuropathy: Secondary | ICD-10-CM | POA: Diagnosis not present

## 2017-09-30 DIAGNOSIS — Z6833 Body mass index (BMI) 33.0-33.9, adult: Secondary | ICD-10-CM | POA: Diagnosis not present

## 2017-09-30 DIAGNOSIS — T7849XS Other allergy, sequela: Secondary | ICD-10-CM | POA: Diagnosis not present

## 2017-09-30 DIAGNOSIS — J208 Acute bronchitis due to other specified organisms: Secondary | ICD-10-CM | POA: Diagnosis not present

## 2017-09-30 DIAGNOSIS — L84 Corns and callosities: Secondary | ICD-10-CM | POA: Diagnosis not present

## 2017-09-30 DIAGNOSIS — N183 Chronic kidney disease, stage 3 (moderate): Secondary | ICD-10-CM | POA: Diagnosis not present

## 2017-09-30 DIAGNOSIS — J060 Acute laryngopharyngitis: Secondary | ICD-10-CM | POA: Diagnosis not present

## 2017-09-30 DIAGNOSIS — R05 Cough: Secondary | ICD-10-CM | POA: Diagnosis not present

## 2017-09-30 DIAGNOSIS — E782 Mixed hyperlipidemia: Secondary | ICD-10-CM | POA: Diagnosis not present

## 2017-09-30 DIAGNOSIS — E1165 Type 2 diabetes mellitus with hyperglycemia: Secondary | ICD-10-CM | POA: Diagnosis not present

## 2017-09-30 DIAGNOSIS — I10 Essential (primary) hypertension: Secondary | ICD-10-CM | POA: Diagnosis not present

## 2017-09-30 DIAGNOSIS — E1122 Type 2 diabetes mellitus with diabetic chronic kidney disease: Secondary | ICD-10-CM | POA: Diagnosis not present

## 2017-10-21 DIAGNOSIS — Z6833 Body mass index (BMI) 33.0-33.9, adult: Secondary | ICD-10-CM | POA: Diagnosis not present

## 2017-10-21 DIAGNOSIS — E1165 Type 2 diabetes mellitus with hyperglycemia: Secondary | ICD-10-CM | POA: Diagnosis not present

## 2017-10-21 DIAGNOSIS — I10 Essential (primary) hypertension: Secondary | ICD-10-CM | POA: Diagnosis not present

## 2017-10-21 DIAGNOSIS — T7849XS Other allergy, sequela: Secondary | ICD-10-CM | POA: Diagnosis not present

## 2017-10-21 DIAGNOSIS — N183 Chronic kidney disease, stage 3 (moderate): Secondary | ICD-10-CM | POA: Diagnosis not present

## 2017-10-21 DIAGNOSIS — E1142 Type 2 diabetes mellitus with diabetic polyneuropathy: Secondary | ICD-10-CM | POA: Diagnosis not present

## 2017-10-21 DIAGNOSIS — L84 Corns and callosities: Secondary | ICD-10-CM | POA: Diagnosis not present

## 2017-10-21 DIAGNOSIS — J060 Acute laryngopharyngitis: Secondary | ICD-10-CM | POA: Diagnosis not present

## 2017-10-21 DIAGNOSIS — R05 Cough: Secondary | ICD-10-CM | POA: Diagnosis not present

## 2017-10-21 DIAGNOSIS — E1122 Type 2 diabetes mellitus with diabetic chronic kidney disease: Secondary | ICD-10-CM | POA: Diagnosis not present

## 2017-10-21 DIAGNOSIS — J208 Acute bronchitis due to other specified organisms: Secondary | ICD-10-CM | POA: Diagnosis not present

## 2017-10-21 DIAGNOSIS — E782 Mixed hyperlipidemia: Secondary | ICD-10-CM | POA: Diagnosis not present

## 2017-10-28 DIAGNOSIS — L298 Other pruritus: Secondary | ICD-10-CM | POA: Diagnosis not present

## 2017-10-28 DIAGNOSIS — L309 Dermatitis, unspecified: Secondary | ICD-10-CM | POA: Diagnosis not present

## 2017-11-02 DIAGNOSIS — R2681 Unsteadiness on feet: Secondary | ICD-10-CM | POA: Diagnosis not present

## 2017-11-02 DIAGNOSIS — L600 Ingrowing nail: Secondary | ICD-10-CM | POA: Diagnosis not present

## 2017-11-02 DIAGNOSIS — L608 Other nail disorders: Secondary | ICD-10-CM | POA: Diagnosis not present

## 2017-11-02 DIAGNOSIS — M25571 Pain in right ankle and joints of right foot: Secondary | ICD-10-CM | POA: Diagnosis not present

## 2017-11-02 DIAGNOSIS — M25572 Pain in left ankle and joints of left foot: Secondary | ICD-10-CM | POA: Diagnosis not present

## 2017-11-16 DIAGNOSIS — M79671 Pain in right foot: Secondary | ICD-10-CM | POA: Diagnosis not present

## 2017-11-16 DIAGNOSIS — L608 Other nail disorders: Secondary | ICD-10-CM | POA: Diagnosis not present

## 2017-11-16 DIAGNOSIS — E1142 Type 2 diabetes mellitus with diabetic polyneuropathy: Secondary | ICD-10-CM | POA: Diagnosis not present

## 2017-11-16 DIAGNOSIS — M79672 Pain in left foot: Secondary | ICD-10-CM | POA: Diagnosis not present

## 2017-11-18 DIAGNOSIS — N183 Chronic kidney disease, stage 3 (moderate): Secondary | ICD-10-CM | POA: Diagnosis not present

## 2017-11-18 DIAGNOSIS — Z6832 Body mass index (BMI) 32.0-32.9, adult: Secondary | ICD-10-CM | POA: Diagnosis not present

## 2017-11-18 DIAGNOSIS — E1165 Type 2 diabetes mellitus with hyperglycemia: Secondary | ICD-10-CM | POA: Diagnosis not present

## 2017-11-18 DIAGNOSIS — E1142 Type 2 diabetes mellitus with diabetic polyneuropathy: Secondary | ICD-10-CM | POA: Diagnosis not present

## 2017-11-18 DIAGNOSIS — E782 Mixed hyperlipidemia: Secondary | ICD-10-CM | POA: Diagnosis not present

## 2017-11-18 DIAGNOSIS — E1122 Type 2 diabetes mellitus with diabetic chronic kidney disease: Secondary | ICD-10-CM | POA: Diagnosis not present

## 2017-11-18 DIAGNOSIS — L84 Corns and callosities: Secondary | ICD-10-CM | POA: Diagnosis not present

## 2017-11-18 DIAGNOSIS — I10 Essential (primary) hypertension: Secondary | ICD-10-CM | POA: Diagnosis not present

## 2017-11-18 DIAGNOSIS — J060 Acute laryngopharyngitis: Secondary | ICD-10-CM | POA: Diagnosis not present

## 2017-11-18 DIAGNOSIS — J208 Acute bronchitis due to other specified organisms: Secondary | ICD-10-CM | POA: Diagnosis not present

## 2017-11-18 DIAGNOSIS — R05 Cough: Secondary | ICD-10-CM | POA: Diagnosis not present

## 2017-11-30 DIAGNOSIS — L259 Unspecified contact dermatitis, unspecified cause: Secondary | ICD-10-CM | POA: Diagnosis not present

## 2017-11-30 DIAGNOSIS — R601 Generalized edema: Secondary | ICD-10-CM | POA: Diagnosis not present

## 2017-12-02 DIAGNOSIS — L270 Generalized skin eruption due to drugs and medicaments taken internally: Secondary | ICD-10-CM | POA: Diagnosis not present

## 2017-12-16 ENCOUNTER — Other Ambulatory Visit: Payer: Self-pay | Admitting: Family Medicine

## 2018-01-04 DIAGNOSIS — T887XXA Unspecified adverse effect of drug or medicament, initial encounter: Secondary | ICD-10-CM | POA: Diagnosis not present

## 2018-01-04 DIAGNOSIS — L259 Unspecified contact dermatitis, unspecified cause: Secondary | ICD-10-CM | POA: Diagnosis not present

## 2018-01-07 IMAGING — CR DG CHEST 1V PORT
2 series · 2 of 2 positions shown · non-contrast
Comparison: 05/26/2016

CLINICAL DATA: Shortness of breath

EXAM:
PORTABLE CHEST 1 VIEW

[portable (1 of 2)]
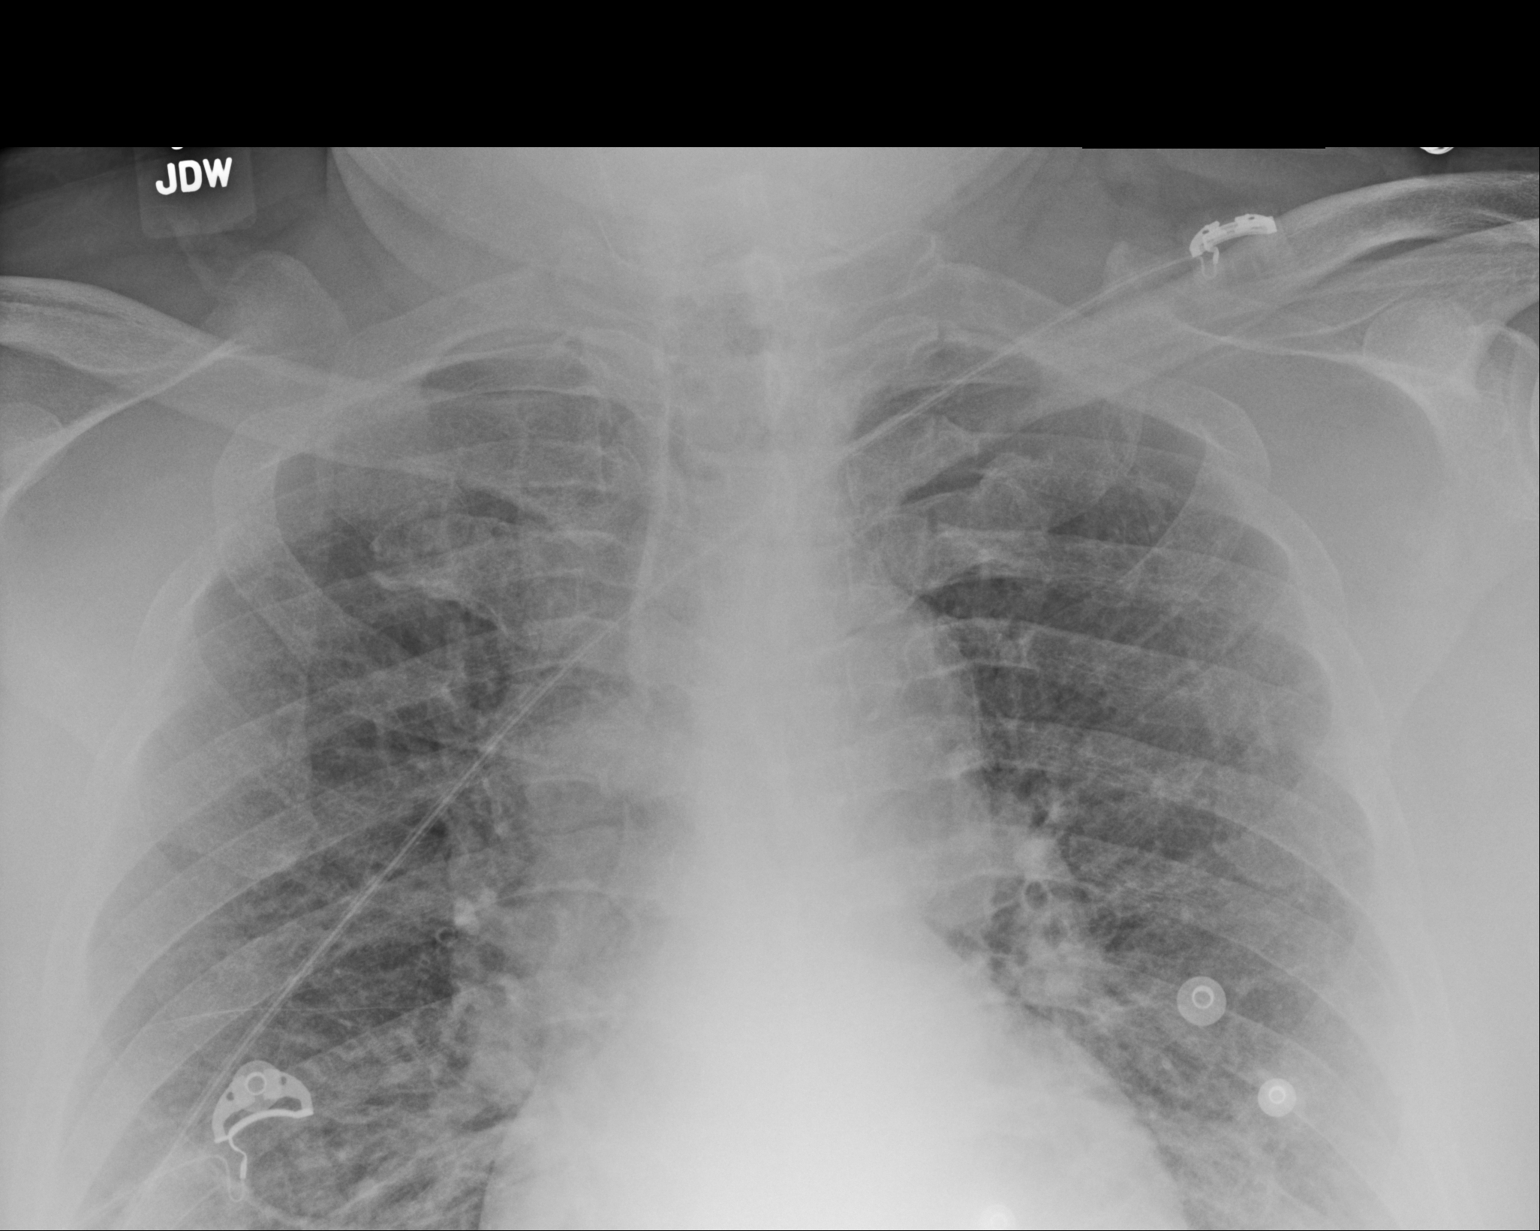

[portable (2 of 2)]
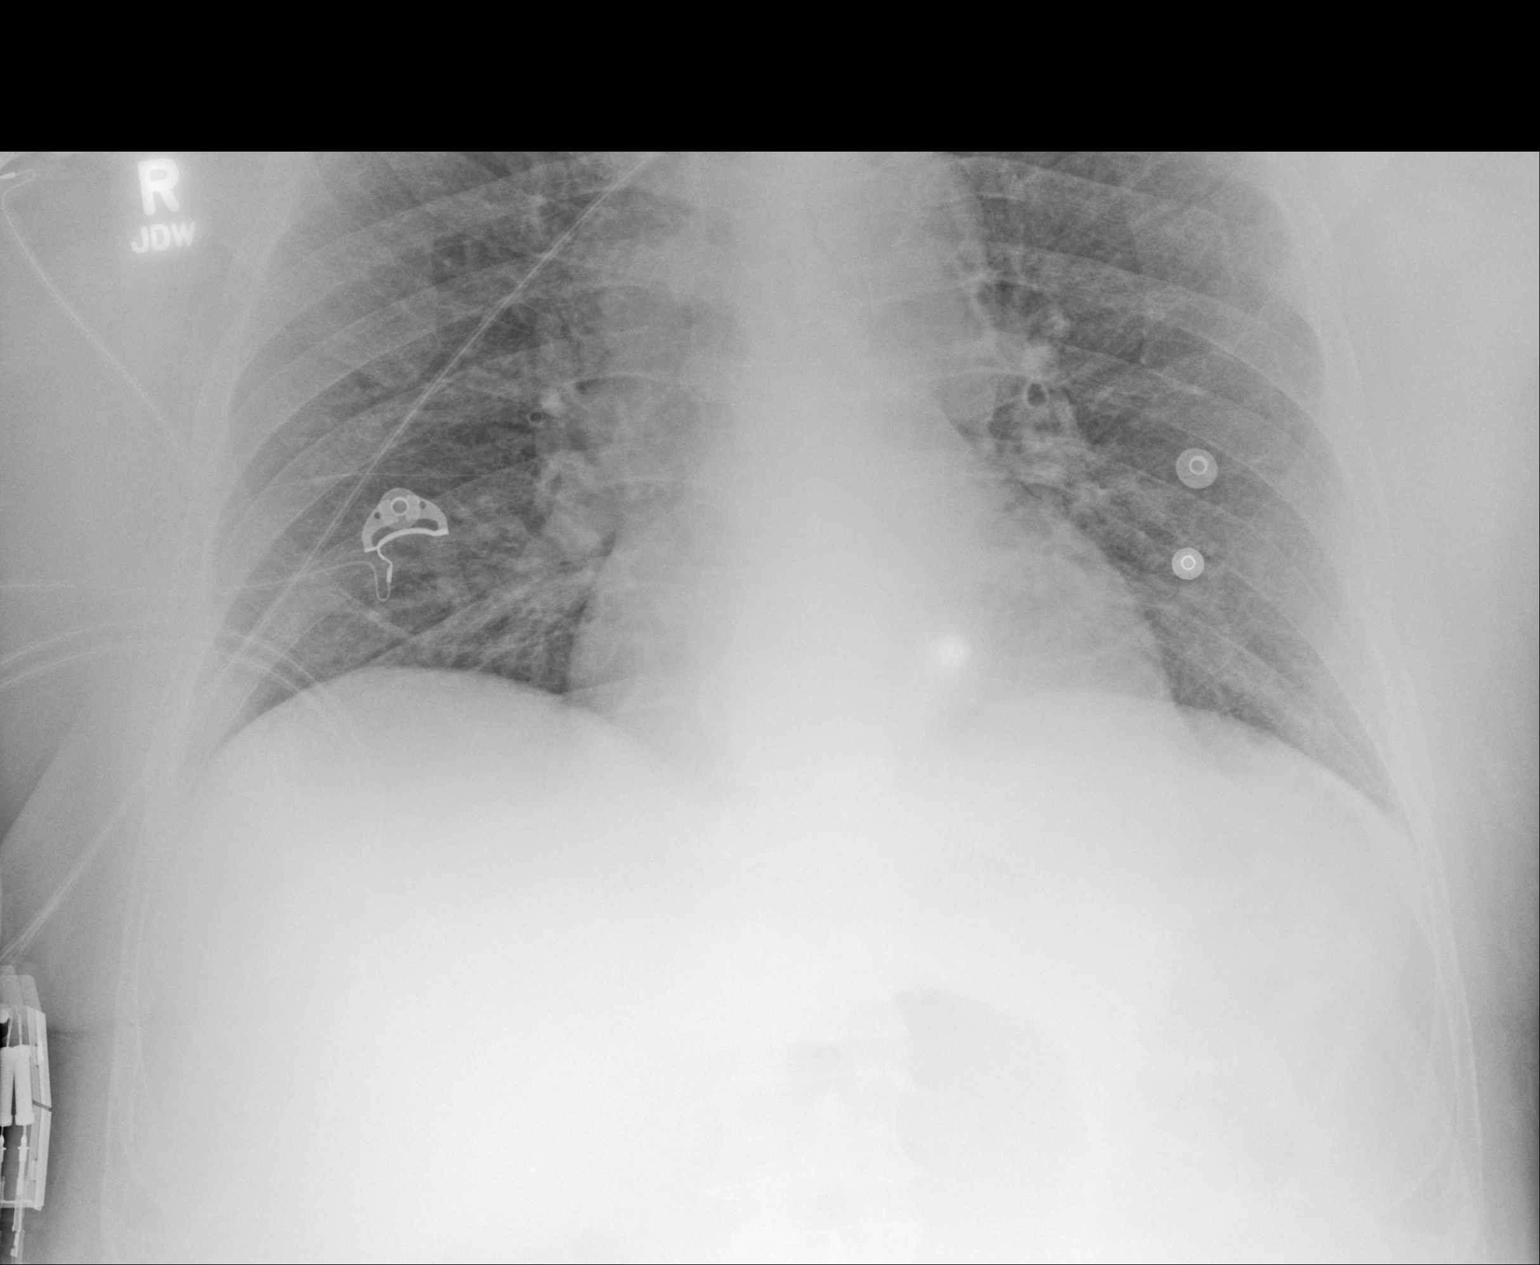

[2 of 2 positions shown; findings below may reference images not displayed]

FINDINGS: Normal heart size with mild central vascular congestion. No focal
pneumonia, collapse or consolidation. Negative for edema, effusion
or pneumothorax. Trachea is midline. Atherosclerosis of the aorta.
No acute osseous finding.
IMPRESSION: Mild central vascular congestion without CHF or focal pneumonia.

## 2018-01-12 DIAGNOSIS — N183 Chronic kidney disease, stage 3 (moderate): Secondary | ICD-10-CM | POA: Diagnosis not present

## 2018-01-14 DIAGNOSIS — N183 Chronic kidney disease, stage 3 (moderate): Secondary | ICD-10-CM | POA: Diagnosis not present

## 2018-02-18 DIAGNOSIS — Z6832 Body mass index (BMI) 32.0-32.9, adult: Secondary | ICD-10-CM | POA: Diagnosis not present

## 2018-02-18 DIAGNOSIS — E1142 Type 2 diabetes mellitus with diabetic polyneuropathy: Secondary | ICD-10-CM | POA: Diagnosis not present

## 2018-02-18 DIAGNOSIS — E1165 Type 2 diabetes mellitus with hyperglycemia: Secondary | ICD-10-CM | POA: Diagnosis not present

## 2018-02-18 DIAGNOSIS — N183 Chronic kidney disease, stage 3 (moderate): Secondary | ICD-10-CM | POA: Diagnosis not present

## 2018-02-18 DIAGNOSIS — E782 Mixed hyperlipidemia: Secondary | ICD-10-CM | POA: Diagnosis not present

## 2018-02-18 DIAGNOSIS — N400 Enlarged prostate without lower urinary tract symptoms: Secondary | ICD-10-CM | POA: Diagnosis not present

## 2018-02-18 DIAGNOSIS — J208 Acute bronchitis due to other specified organisms: Secondary | ICD-10-CM | POA: Diagnosis not present

## 2018-02-18 DIAGNOSIS — L84 Corns and callosities: Secondary | ICD-10-CM | POA: Diagnosis not present

## 2018-02-18 DIAGNOSIS — I10 Essential (primary) hypertension: Secondary | ICD-10-CM | POA: Diagnosis not present

## 2018-02-18 DIAGNOSIS — E1122 Type 2 diabetes mellitus with diabetic chronic kidney disease: Secondary | ICD-10-CM | POA: Diagnosis not present

## 2018-02-18 DIAGNOSIS — J060 Acute laryngopharyngitis: Secondary | ICD-10-CM | POA: Diagnosis not present

## 2018-03-18 DIAGNOSIS — N400 Enlarged prostate without lower urinary tract symptoms: Secondary | ICD-10-CM | POA: Diagnosis not present

## 2018-03-18 DIAGNOSIS — E1121 Type 2 diabetes mellitus with diabetic nephropathy: Secondary | ICD-10-CM | POA: Diagnosis not present

## 2018-03-18 DIAGNOSIS — E1165 Type 2 diabetes mellitus with hyperglycemia: Secondary | ICD-10-CM | POA: Diagnosis not present

## 2018-03-18 DIAGNOSIS — E1122 Type 2 diabetes mellitus with diabetic chronic kidney disease: Secondary | ICD-10-CM | POA: Diagnosis not present

## 2018-03-18 DIAGNOSIS — L84 Corns and callosities: Secondary | ICD-10-CM | POA: Diagnosis not present

## 2018-03-18 DIAGNOSIS — Z6832 Body mass index (BMI) 32.0-32.9, adult: Secondary | ICD-10-CM | POA: Diagnosis not present

## 2018-03-18 DIAGNOSIS — N183 Chronic kidney disease, stage 3 (moderate): Secondary | ICD-10-CM | POA: Diagnosis not present

## 2018-03-18 DIAGNOSIS — E1142 Type 2 diabetes mellitus with diabetic polyneuropathy: Secondary | ICD-10-CM | POA: Diagnosis not present

## 2018-03-18 DIAGNOSIS — I10 Essential (primary) hypertension: Secondary | ICD-10-CM | POA: Diagnosis not present

## 2018-03-18 DIAGNOSIS — E782 Mixed hyperlipidemia: Secondary | ICD-10-CM | POA: Diagnosis not present

## 2018-03-22 DIAGNOSIS — Z905 Acquired absence of kidney: Secondary | ICD-10-CM | POA: Diagnosis not present

## 2018-03-22 DIAGNOSIS — R972 Elevated prostate specific antigen [PSA]: Secondary | ICD-10-CM | POA: Diagnosis not present

## 2018-03-22 DIAGNOSIS — N401 Enlarged prostate with lower urinary tract symptoms: Secondary | ICD-10-CM | POA: Diagnosis not present

## 2018-03-22 DIAGNOSIS — N5201 Erectile dysfunction due to arterial insufficiency: Secondary | ICD-10-CM | POA: Diagnosis not present

## 2018-04-15 DIAGNOSIS — T464X5D Adverse effect of angiotensin-converting-enzyme inhibitors, subsequent encounter: Secondary | ICD-10-CM | POA: Diagnosis not present

## 2018-04-15 DIAGNOSIS — Z6832 Body mass index (BMI) 32.0-32.9, adult: Secondary | ICD-10-CM | POA: Diagnosis not present

## 2018-04-15 DIAGNOSIS — E1122 Type 2 diabetes mellitus with diabetic chronic kidney disease: Secondary | ICD-10-CM | POA: Diagnosis not present

## 2018-04-15 DIAGNOSIS — Z905 Acquired absence of kidney: Secondary | ICD-10-CM | POA: Diagnosis not present

## 2018-04-15 DIAGNOSIS — I428 Other cardiomyopathies: Secondary | ICD-10-CM | POA: Diagnosis not present

## 2018-04-15 DIAGNOSIS — E1142 Type 2 diabetes mellitus with diabetic polyneuropathy: Secondary | ICD-10-CM | POA: Diagnosis not present

## 2018-04-15 DIAGNOSIS — I10 Essential (primary) hypertension: Secondary | ICD-10-CM | POA: Diagnosis not present

## 2018-04-15 DIAGNOSIS — Z0000 Encounter for general adult medical examination without abnormal findings: Secondary | ICD-10-CM | POA: Diagnosis not present

## 2018-04-15 DIAGNOSIS — N183 Chronic kidney disease, stage 3 (moderate): Secondary | ICD-10-CM | POA: Diagnosis not present

## 2018-04-15 DIAGNOSIS — E782 Mixed hyperlipidemia: Secondary | ICD-10-CM | POA: Diagnosis not present

## 2018-04-15 DIAGNOSIS — E1165 Type 2 diabetes mellitus with hyperglycemia: Secondary | ICD-10-CM | POA: Diagnosis not present

## 2018-04-15 DIAGNOSIS — N400 Enlarged prostate without lower urinary tract symptoms: Secondary | ICD-10-CM | POA: Diagnosis not present

## 2018-04-19 DIAGNOSIS — N183 Chronic kidney disease, stage 3 (moderate): Secondary | ICD-10-CM | POA: Diagnosis not present

## 2018-05-04 DIAGNOSIS — N183 Chronic kidney disease, stage 3 (moderate): Secondary | ICD-10-CM | POA: Diagnosis not present

## 2018-05-04 DIAGNOSIS — E559 Vitamin D deficiency, unspecified: Secondary | ICD-10-CM | POA: Diagnosis not present

## 2018-05-04 DIAGNOSIS — M1039 Gout due to renal impairment, multiple sites: Secondary | ICD-10-CM | POA: Diagnosis not present

## 2018-05-04 DIAGNOSIS — E6609 Other obesity due to excess calories: Secondary | ICD-10-CM | POA: Diagnosis not present

## 2018-05-04 DIAGNOSIS — Z905 Acquired absence of kidney: Secondary | ICD-10-CM | POA: Diagnosis not present

## 2018-05-04 DIAGNOSIS — E782 Mixed hyperlipidemia: Secondary | ICD-10-CM | POA: Diagnosis not present

## 2018-05-04 DIAGNOSIS — R809 Proteinuria, unspecified: Secondary | ICD-10-CM | POA: Diagnosis not present

## 2018-05-04 DIAGNOSIS — N400 Enlarged prostate without lower urinary tract symptoms: Secondary | ICD-10-CM | POA: Diagnosis not present

## 2018-05-04 DIAGNOSIS — I129 Hypertensive chronic kidney disease with stage 1 through stage 4 chronic kidney disease, or unspecified chronic kidney disease: Secondary | ICD-10-CM | POA: Diagnosis not present

## 2018-05-04 DIAGNOSIS — E1122 Type 2 diabetes mellitus with diabetic chronic kidney disease: Secondary | ICD-10-CM | POA: Diagnosis not present

## 2018-05-20 DIAGNOSIS — I129 Hypertensive chronic kidney disease with stage 1 through stage 4 chronic kidney disease, or unspecified chronic kidney disease: Secondary | ICD-10-CM | POA: Diagnosis not present

## 2018-05-20 DIAGNOSIS — T464X5D Adverse effect of angiotensin-converting-enzyme inhibitors, subsequent encounter: Secondary | ICD-10-CM | POA: Diagnosis not present

## 2018-05-20 DIAGNOSIS — Z0000 Encounter for general adult medical examination without abnormal findings: Secondary | ICD-10-CM | POA: Diagnosis not present

## 2018-05-20 DIAGNOSIS — E1165 Type 2 diabetes mellitus with hyperglycemia: Secondary | ICD-10-CM | POA: Diagnosis not present

## 2018-05-20 DIAGNOSIS — I10 Essential (primary) hypertension: Secondary | ICD-10-CM | POA: Diagnosis not present

## 2018-05-20 DIAGNOSIS — Z6832 Body mass index (BMI) 32.0-32.9, adult: Secondary | ICD-10-CM | POA: Diagnosis not present

## 2018-07-15 IMAGING — DX DG ABDOMEN 1V
2 series · 2 of 2 positions shown · non-contrast
Comparison: None.

CLINICAL DATA: Low back and groin pain

EXAM:
ABDOMEN - 1 VIEW

[abdomen standing ap (1 of 2)]
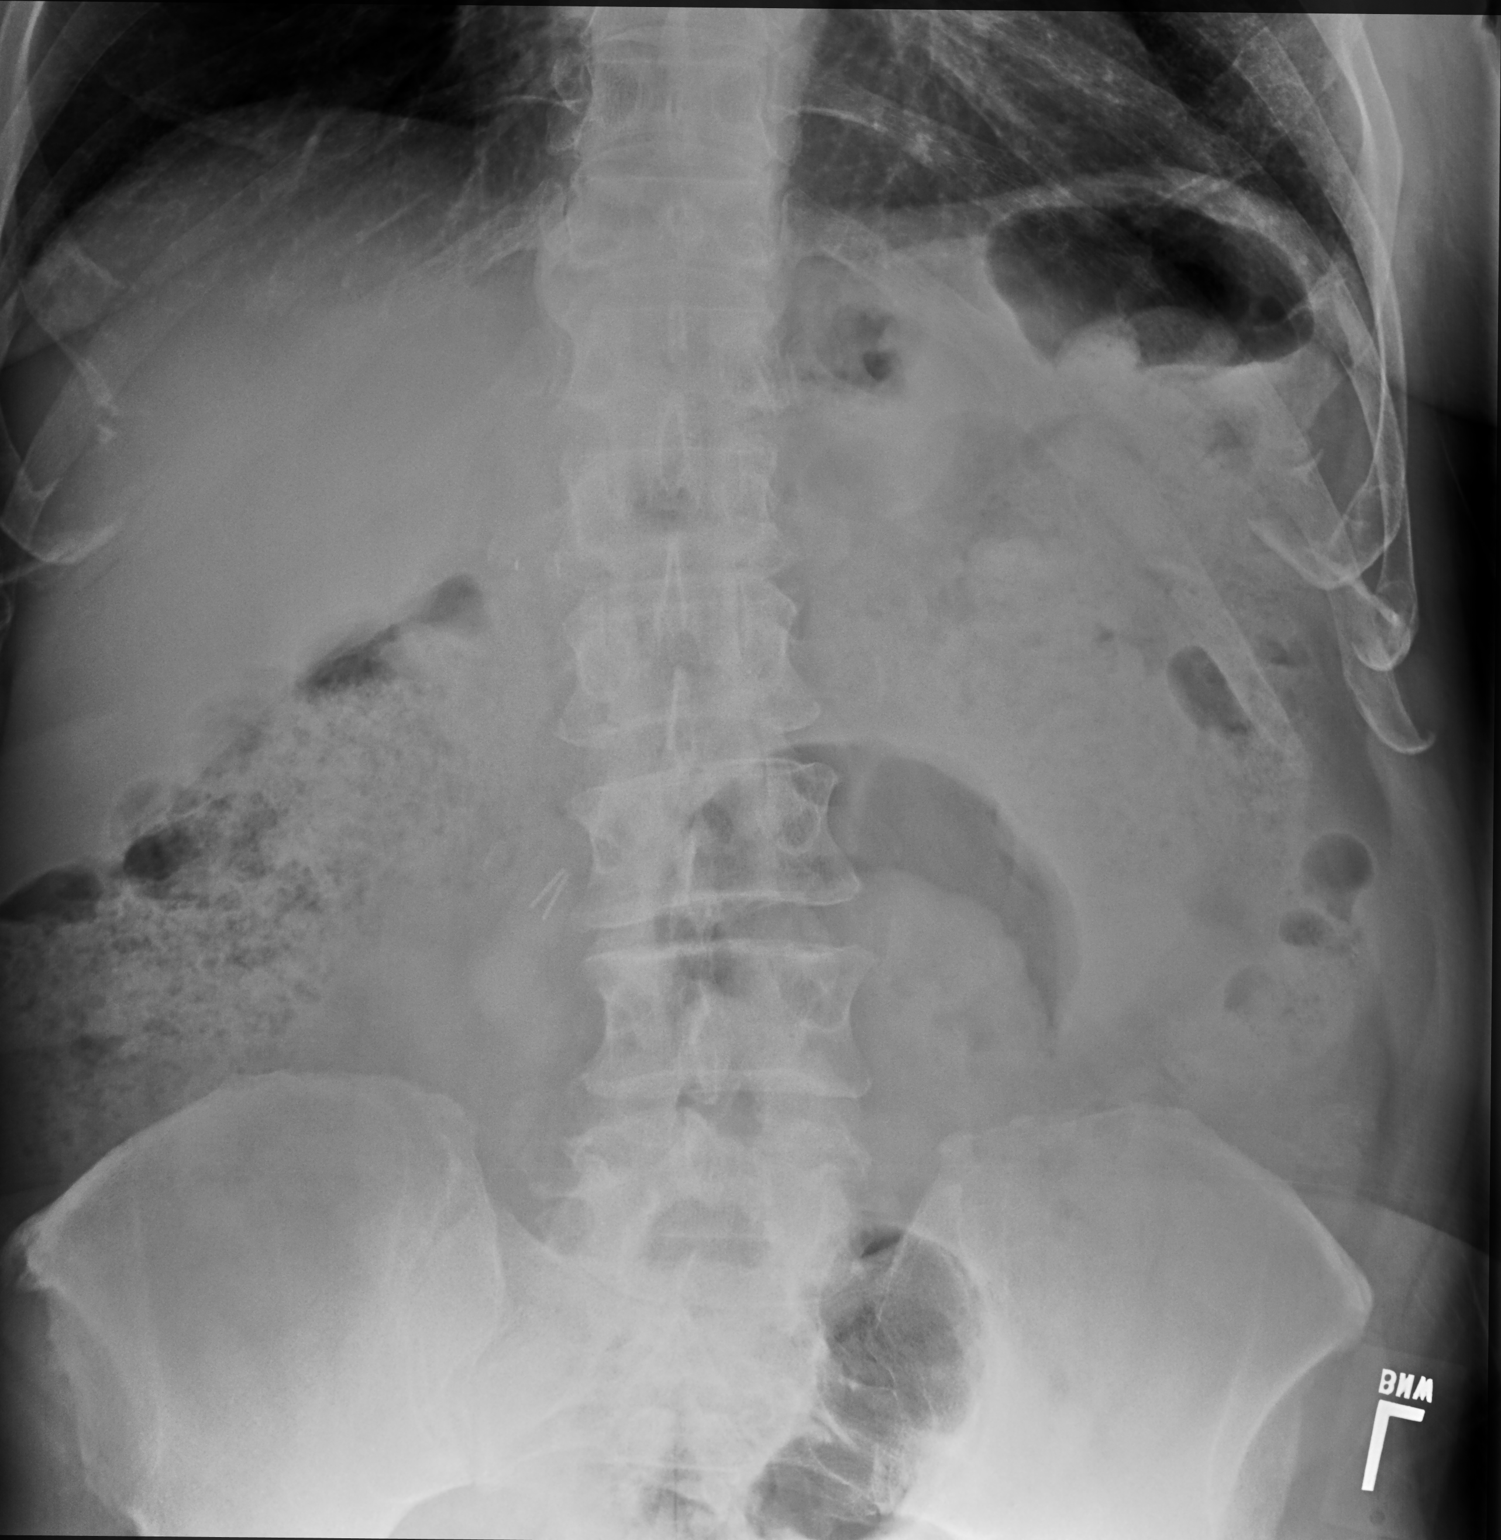

[abdomen standing ap (2 of 2)]
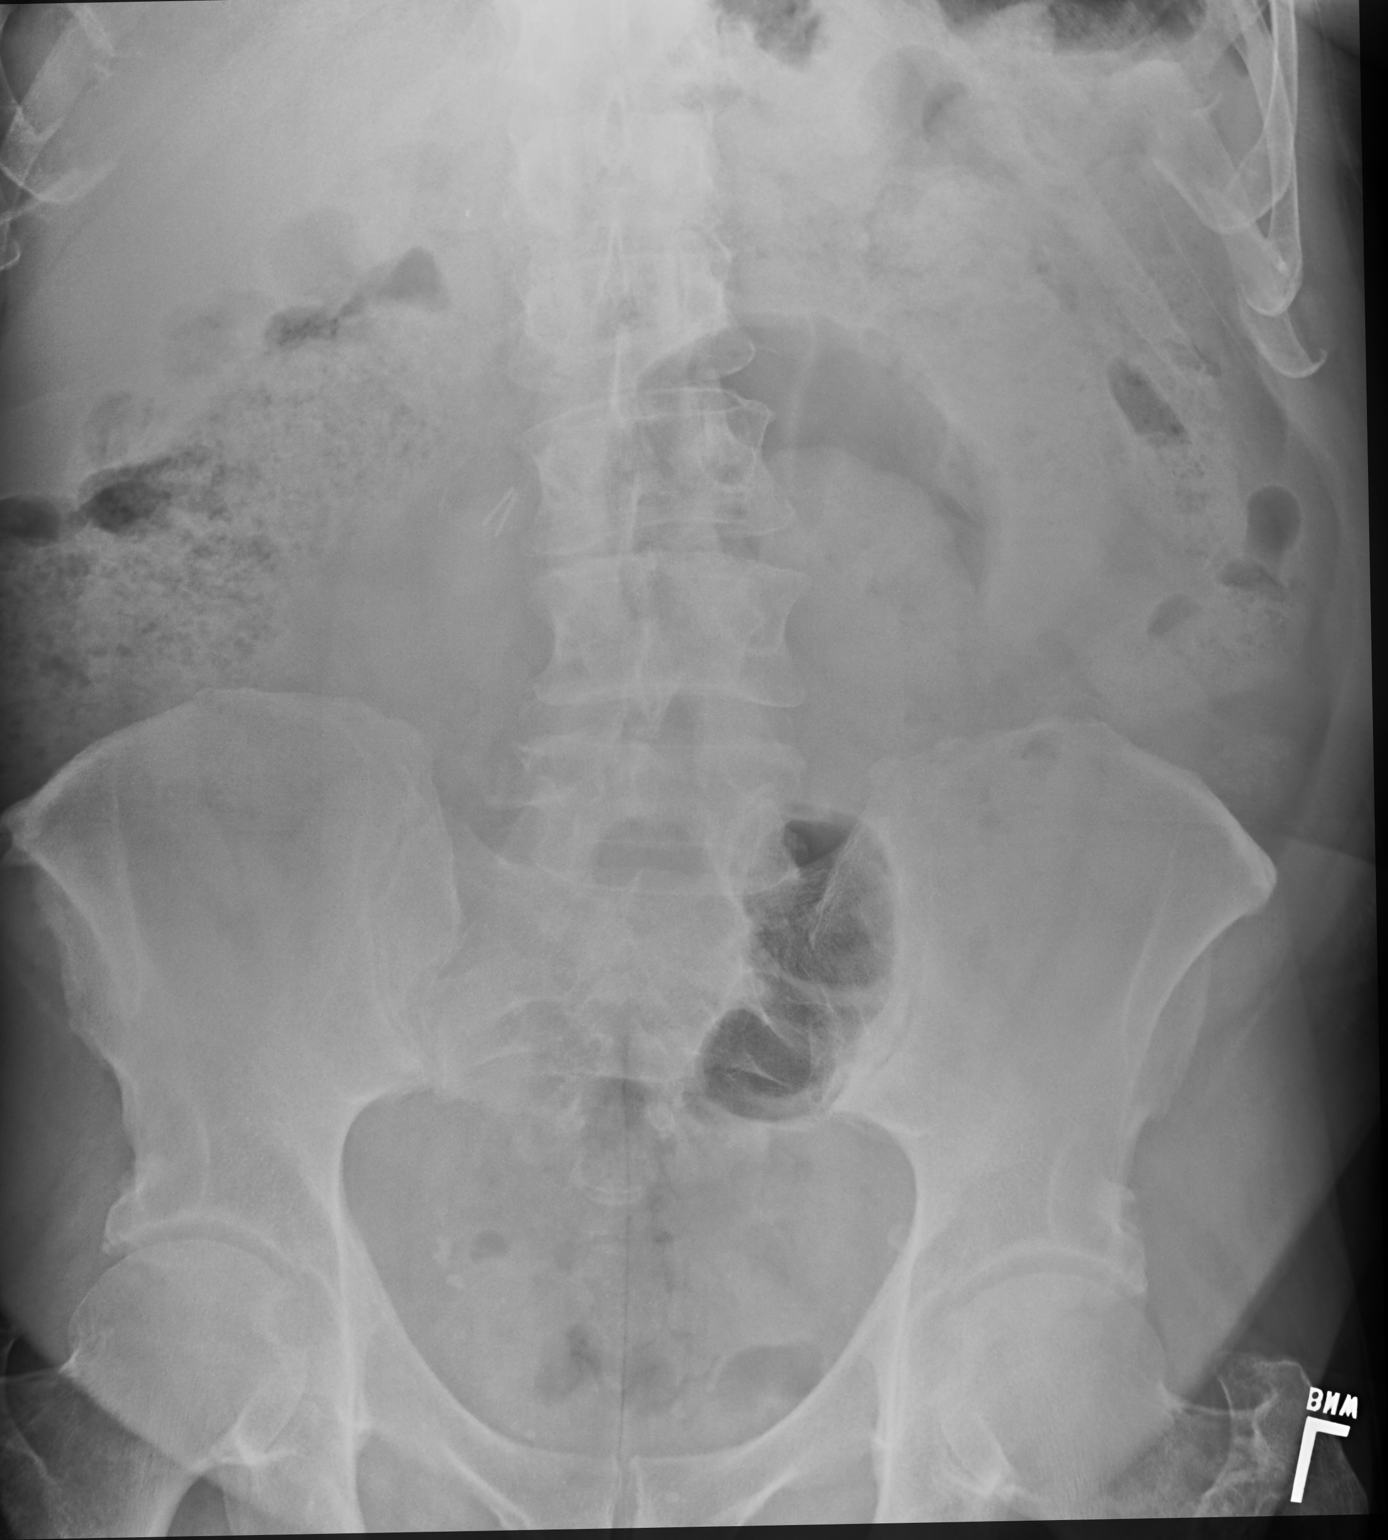

[2 of 2 positions shown; findings below may reference images not displayed]

FINDINGS: Moderate stool burden throughout the colon. There is a non
obstructive bowel gas pattern. No supine evidence of free air. No
organomegaly or suspicious calcification. No acute bony abnormality.
IMPRESSION: Moderate stool burden.  No acute findings.
